# Patient Record
Sex: Male | Born: 1940
Health system: Southern US, Community
[De-identification: ages and names within clinical notes are randomized; demographics above are authoritative.]

## PROBLEM LIST (undated history)

## (undated) DIAGNOSIS — E291 Testicular hypofunction: Secondary | ICD-10-CM

## (undated) DIAGNOSIS — E785 Hyperlipidemia, unspecified: Secondary | ICD-10-CM

## (undated) DIAGNOSIS — Z923 Personal history of irradiation: Secondary | ICD-10-CM

## (undated) DIAGNOSIS — R413 Other amnesia: Secondary | ICD-10-CM

## (undated) DIAGNOSIS — Z87898 Personal history of other specified conditions: Secondary | ICD-10-CM

## (undated) DIAGNOSIS — K08109 Complete loss of teeth, unspecified cause, unspecified class: Secondary | ICD-10-CM

## (undated) DIAGNOSIS — M1990 Unspecified osteoarthritis, unspecified site: Secondary | ICD-10-CM

## (undated) DIAGNOSIS — Z789 Other specified health status: Secondary | ICD-10-CM

## (undated) DIAGNOSIS — R011 Cardiac murmur, unspecified: Secondary | ICD-10-CM

## (undated) DIAGNOSIS — F329 Major depressive disorder, single episode, unspecified: Secondary | ICD-10-CM

## (undated) DIAGNOSIS — Z8711 Personal history of peptic ulcer disease: Secondary | ICD-10-CM

## (undated) DIAGNOSIS — Z972 Presence of dental prosthetic device (complete) (partial): Secondary | ICD-10-CM

## (undated) DIAGNOSIS — H9190 Unspecified hearing loss, unspecified ear: Secondary | ICD-10-CM

## (undated) DIAGNOSIS — F32A Depression, unspecified: Secondary | ICD-10-CM

## (undated) DIAGNOSIS — I10 Essential (primary) hypertension: Secondary | ICD-10-CM

## (undated) DIAGNOSIS — Z973 Presence of spectacles and contact lenses: Secondary | ICD-10-CM

## (undated) DIAGNOSIS — K649 Unspecified hemorrhoids: Secondary | ICD-10-CM

## (undated) DIAGNOSIS — C61 Malignant neoplasm of prostate: Secondary | ICD-10-CM

## (undated) DIAGNOSIS — N400 Enlarged prostate without lower urinary tract symptoms: Secondary | ICD-10-CM

## (undated) DIAGNOSIS — R399 Unspecified symptoms and signs involving the genitourinary system: Secondary | ICD-10-CM

## (undated) DIAGNOSIS — N529 Male erectile dysfunction, unspecified: Secondary | ICD-10-CM

## (undated) HISTORY — PX: COLONOSCOPY: SHX174

## (undated) HISTORY — DX: Other amnesia: R41.3

## (undated) HISTORY — PX: MOUTH SURGERY: SHX715

## (undated) HISTORY — PX: MULTIPLE TOOTH EXTRACTIONS: SHX2053

---

## 1999-04-26 ENCOUNTER — Encounter: Payer: Self-pay | Admitting: *Deleted

## 1999-04-26 ENCOUNTER — Ambulatory Visit (HOSPITAL_COMMUNITY): Admission: RE | Admit: 1999-04-26 | Discharge: 1999-04-26 | Payer: Self-pay | Admitting: *Deleted

## 1999-04-29 ENCOUNTER — Ambulatory Visit (HOSPITAL_COMMUNITY): Admission: RE | Admit: 1999-04-29 | Discharge: 1999-04-29 | Payer: Self-pay | Admitting: *Deleted

## 1999-05-04 ENCOUNTER — Ambulatory Visit (HOSPITAL_COMMUNITY): Admission: RE | Admit: 1999-05-04 | Discharge: 1999-05-04 | Payer: Self-pay | Admitting: *Deleted

## 1999-05-05 ENCOUNTER — Ambulatory Visit (HOSPITAL_COMMUNITY): Admission: RE | Admit: 1999-05-05 | Discharge: 1999-05-05 | Payer: Self-pay | Admitting: *Deleted

## 1999-05-05 ENCOUNTER — Encounter: Payer: Self-pay | Admitting: *Deleted

## 1999-06-09 ENCOUNTER — Encounter: Payer: Self-pay | Admitting: General Surgery

## 1999-06-09 ENCOUNTER — Ambulatory Visit (HOSPITAL_COMMUNITY): Admission: RE | Admit: 1999-06-09 | Discharge: 1999-06-09 | Payer: Self-pay | Admitting: General Surgery

## 2002-05-24 ENCOUNTER — Encounter: Payer: Self-pay | Admitting: Emergency Medicine

## 2002-05-24 ENCOUNTER — Emergency Department (HOSPITAL_COMMUNITY): Admission: EM | Admit: 2002-05-24 | Discharge: 2002-05-24 | Payer: Self-pay | Admitting: Emergency Medicine

## 2002-08-29 ENCOUNTER — Emergency Department (HOSPITAL_COMMUNITY): Admission: EM | Admit: 2002-08-29 | Discharge: 2002-08-29 | Payer: Self-pay | Admitting: Emergency Medicine

## 2002-09-13 ENCOUNTER — Ambulatory Visit (HOSPITAL_COMMUNITY): Admission: RE | Admit: 2002-09-13 | Discharge: 2002-09-13 | Payer: Self-pay | Admitting: *Deleted

## 2003-01-24 ENCOUNTER — Emergency Department (HOSPITAL_COMMUNITY): Admission: EM | Admit: 2003-01-24 | Discharge: 2003-01-24 | Payer: Self-pay | Admitting: Emergency Medicine

## 2003-12-08 ENCOUNTER — Emergency Department (HOSPITAL_COMMUNITY): Admission: EM | Admit: 2003-12-08 | Discharge: 2003-12-08 | Payer: Self-pay | Admitting: Emergency Medicine

## 2005-08-13 ENCOUNTER — Emergency Department (HOSPITAL_COMMUNITY): Admission: EM | Admit: 2005-08-13 | Discharge: 2005-08-13 | Payer: Self-pay | Admitting: Family Medicine

## 2005-08-25 ENCOUNTER — Encounter: Admission: RE | Admit: 2005-08-25 | Discharge: 2005-08-25 | Payer: Self-pay | Admitting: Family Medicine

## 2005-09-23 ENCOUNTER — Encounter: Admission: RE | Admit: 2005-09-23 | Discharge: 2005-09-23 | Payer: Self-pay | Admitting: Family Medicine

## 2005-10-13 ENCOUNTER — Ambulatory Visit (HOSPITAL_COMMUNITY): Admission: RE | Admit: 2005-10-13 | Discharge: 2005-10-13 | Payer: Self-pay | Admitting: Orthopedic Surgery

## 2005-10-15 ENCOUNTER — Encounter: Admission: RE | Admit: 2005-10-15 | Discharge: 2005-10-15 | Payer: Self-pay | Admitting: Orthopedic Surgery

## 2005-10-31 ENCOUNTER — Encounter: Admission: RE | Admit: 2005-10-31 | Discharge: 2005-10-31 | Payer: Self-pay | Admitting: Orthopedic Surgery

## 2005-11-06 ENCOUNTER — Emergency Department (HOSPITAL_COMMUNITY): Admission: EM | Admit: 2005-11-06 | Discharge: 2005-11-06 | Payer: Self-pay

## 2005-11-22 ENCOUNTER — Encounter: Admission: RE | Admit: 2005-11-22 | Discharge: 2005-11-22 | Payer: Self-pay | Admitting: Orthopedic Surgery

## 2006-02-16 ENCOUNTER — Encounter: Admission: RE | Admit: 2006-02-16 | Discharge: 2006-02-16 | Payer: Self-pay | Admitting: Orthopedic Surgery

## 2006-06-02 ENCOUNTER — Encounter: Admission: RE | Admit: 2006-06-02 | Discharge: 2006-06-02 | Payer: Self-pay | Admitting: Orthopedic Surgery

## 2006-09-21 ENCOUNTER — Ambulatory Visit (HOSPITAL_BASED_OUTPATIENT_CLINIC_OR_DEPARTMENT_OTHER): Admission: RE | Admit: 2006-09-21 | Discharge: 2006-09-21 | Payer: Self-pay | Admitting: Urology

## 2006-09-21 HISTORY — PX: CIRCUMCISION: SUR203

## 2010-08-03 NOTE — Op Note (Signed)
NAME:  WILFRID, HYSER NO.:  [id number]   MEDICAL RECORD NO.:  [id number]          PATIENT TYPE:  AMB   LOCATION:  [facility]                         FACILITY:  [facility]   PHYSICIAN:  Sigmund I. Patsi Sears, M.D.DATE OF BIRTH:  23-Feb-1941   DATE OF PROCEDURE:  09/21/2006  DATE OF DISCHARGE:                               OPERATIVE REPORT   PREOPERATIVE DIAGNOSIS:  Chronic balanitis.   POSTOPERATIVE DIAGNOSIS:  Chronic balanitis.   OPERATION:  Circumcision.   SURGEON:  Sigmund I. Patsi Sears, M.D.   ANESTHESIA:  General LMA.   PREPARATION:  After appropriate preanesthesia, the patient was brought  to the operating room and placed on the operating room table in the  dorsal supine position where general LMA anesthesia was induced.  He was  then replaced in the dorsal lithotomy position where the pubis was  prepped with Betadine solution and draped in the usual fashion.   REVIEW OF HISTORY:  This 70 year old male has a history of chronic  balanitis, now cleared, in for elective circumcision.   PROCEDURE:  Using a marking pen, outlines of the pericoronal and  periglanular tissue was accomplished.  A circumferential 0.5 plain  Marcaine is injected at the base of the penis, and circumferentially  around the skin.  Incisions were made around the pericoronal and  periglanular surfaces, subcutaneous tissue was dissected, with minimal  bleeding noted.  Bleeding was electrocoagulated.   Four separate quadrants of 4-0 Monocryl suture were then placed and each  quadrant was closed with interrupted 4-0 Monocryl suture.  The patient  tolerated the procedure well.  He was given IV Toradol prior to  awakening.      Sigmund I. Patsi Sears, M.D.  Electronically Signed     SIT/MEDQ  D:  09/21/2006  T:  09/21/2006  Job:  [id number]

## 2010-08-06 NOTE — Op Note (Signed)
[facility]  Patient:    DEUNDRE, THONG                   MRN: [id number] Proc. Date: 04/29/99 Adm. Date:  [id number] Attending:  Sharyn Dross                           Operative Report  PREOPERATIVE DIAGNOSES: 1. History of gastroesophageal reflux disease. 2. Large hiatal hernia.  POSTOPERATIVE DIAGNOSIS:  Grossly normal examination; however, visualization was obscured due to the patients reaction.  PROCEDURE:  Esophagogastrostomy.  MEDICATIONS:  Demerol 50 mg IV, Versed 6 mg IV over a 10-minute period of time.  INSTRUMENT:  Olympus video panendoscopy.  ENDOSCOPIST:  Sharyn Dross., M.D.  INDICATIONS:  This pleasant 70 year old gentleman came into the office with increased shortness of breath and discomfort.  The patient has had a history of a large hiatal hernia with a previous barium, upper GI series, showing that there was a moderate amount of reflux up his nearly entire gastric area, into the esophageal region that is present.  Based on the patients symptoms, an x-ray was performed  for the shortness of breath, which basically showed that he either had a large hiatal hernia or an elevated diaphragm.  The patient was brought in for an endoscopic examination, based on these symptoms.  PHYSICAL EXAMINATION:  GENERAL:  He is a pleasant gentleman who appears to be in no acute distress at his time.  VITAL SIGNS:  Stable.  HEENT:  Anicteric.  NECK:  Supple.  LUNGS:  Clear.  HEART:  A regular rate and rhythm.  ABDOMEN:  Soft, with mild tenderness to palpation in the supraumbilical region. No rebound or referred tenderness appreciated.  RECTAL:  A digital rectal examination appeared to be unremarkable.  EXTREMITIES:  No cyanosis, clubbing, or edema.  PLAN: 1. We are going to proceed with the endoscopic examination. 2. Will schedule the patient for an esophageal manometry study, and a    gastric emptying study to  evaluate if this patient is an adequate candidate    for surgical repair of the large hiatal hernia that was appreciated.    Depending upon these results, will determine the course of therapy.  INFORMED CONSENT:  The patient was advised of the procedure, the indications and the risks involved.  The patient has agreed to have the procedure performed.  PREOPERATIVE PREPARATION:  The patient was brought into the endoscopy unit where an IV for IV-sedating medication was started.  A monitor was placed on the patient to monitor the patients vital signs and oxygen saturation.  Nasal oxygen at 2-3 L er minute was used, and after sedation was performed, the procedure was begun.  DESCRIPTION OF PROCEDURE:  The instrument was advanced, with the patient lying n the left lateral position via a direct technique without difficulty.  The oropharyngeal, epiglottis, vocal cords, and piriform sinuses appeared to be grossly within normal limits.  The esophagus appeared to show evidence of a short esophagus, but the measurement was not initially obtained at this time.  There appeared to be no gross abnormalities such as acute inflammation or ulceration hat was noted.  Upon advancing into the gastric region, the gastric area appeared to be grossly  within normal limits without evidence of acute inflammation or ulcerations that was noted; however, as the instrument was advanced further down into the region, there appeared to be poor visualization or no  visualization of the pylorus or the antral area that was noted.  The stomach appeared to be ______ in the region, as the instrument was following the folds down, but could not appreciate the antral region at this time.  The instrument was Jd and the distal portion advanced further for better visualization as fluid was retracted back.  Multiple photographs of the region were taken and the areas that appeared to show evidence where the antral  area  should be, did not show any visible opening at this time.  The instrument was subsequently continued to be advanced; however, the patient appeared to maybe aspirated some material into his tracheal region, and thus became very agitated and reactive.  Thus the instrument had to be removed on an emergent basis at this time.  The patient tolerated the procedure well after suction was obtained and the patient stabilized.  TREATMENT:  Conservative management.  Will proceed with the manometry studies as well as the gastric emptying study, but also will perform an upper GI series, to evaluate this region, as well as to evaluate how the stomach is emptying during  this process.  Depending upon the results, will determine the course of therapy. DD:  04/29/99 TD:  04/29/99 Job: [id number]

## 2011-01-04 LAB — I-STAT 8, (EC8 V) (CONVERTED LAB)
BUN: 16
Bicarbonate: 25.2 — ABNORMAL HIGH
Chloride: 109
Glucose, Bld: 119 — ABNORMAL HIGH
HCT: 51
Operator id: 134391
Potassium: 3.7
Sodium: 143
pCO2, Ven: 34.2 — ABNORMAL LOW
pH, Ven: 7.475 — ABNORMAL HIGH

## 2011-12-26 DIAGNOSIS — C61 Malignant neoplasm of prostate: Secondary | ICD-10-CM | POA: Insufficient documentation

## 2011-12-26 HISTORY — PX: PROSTATE BIOPSY: SHX241

## 2012-01-11 ENCOUNTER — Other Ambulatory Visit (HOSPITAL_COMMUNITY): Payer: Self-pay | Admitting: Urology

## 2012-01-11 DIAGNOSIS — C61 Malignant neoplasm of prostate: Secondary | ICD-10-CM

## 2012-01-13 ENCOUNTER — Encounter: Payer: Self-pay | Admitting: Radiation Oncology

## 2012-01-13 DIAGNOSIS — C50919 Malignant neoplasm of unspecified site of unspecified female breast: Secondary | ICD-10-CM | POA: Insufficient documentation

## 2012-01-16 ENCOUNTER — Encounter (HOSPITAL_COMMUNITY): Payer: Self-pay

## 2012-01-16 ENCOUNTER — Encounter (HOSPITAL_COMMUNITY): Payer: Medicare Other

## 2012-01-17 ENCOUNTER — Ambulatory Visit
Admission: RE | Admit: 2012-01-17 | Discharge: 2012-01-17 | Disposition: A | Discharge status: Home / Self Care | Payer: Medicare Other | Source: Ambulatory Visit | Attending: Radiation Oncology | Admitting: Radiation Oncology

## 2012-01-17 ENCOUNTER — Encounter: Payer: Self-pay | Admitting: Radiation Oncology

## 2012-01-17 VITALS — BP 151/77 | HR 96 | Temp 97.9°F | Resp 20 | Ht 68.0 in | Wt 190.3 lb

## 2012-01-17 DIAGNOSIS — C61 Malignant neoplasm of prostate: Secondary | ICD-10-CM | POA: Insufficient documentation

## 2012-01-17 DIAGNOSIS — K219 Gastro-esophageal reflux disease without esophagitis: Secondary | ICD-10-CM | POA: Insufficient documentation

## 2012-01-17 DIAGNOSIS — Z79899 Other long term (current) drug therapy: Secondary | ICD-10-CM | POA: Insufficient documentation

## 2012-01-17 DIAGNOSIS — E7800 Pure hypercholesterolemia, unspecified: Secondary | ICD-10-CM | POA: Insufficient documentation

## 2012-01-17 DIAGNOSIS — Z510 Encounter for antineoplastic radiation therapy: Secondary | ICD-10-CM | POA: Insufficient documentation

## 2012-01-17 DIAGNOSIS — Z853 Personal history of malignant neoplasm of breast: Secondary | ICD-10-CM | POA: Insufficient documentation

## 2012-01-17 DIAGNOSIS — Z87891 Personal history of nicotine dependence: Secondary | ICD-10-CM | POA: Insufficient documentation

## 2012-01-17 DIAGNOSIS — I10 Essential (primary) hypertension: Secondary | ICD-10-CM | POA: Insufficient documentation

## 2012-01-17 HISTORY — DX: Depression, unspecified: F32.A

## 2012-01-17 HISTORY — DX: Male erectile dysfunction, unspecified: N52.9

## 2012-01-17 HISTORY — DX: Testicular hypofunction: E29.1

## 2012-01-17 HISTORY — DX: Cardiac murmur, unspecified: R01.1

## 2012-01-17 HISTORY — DX: Unspecified osteoarthritis, unspecified site: M19.90

## 2012-01-17 HISTORY — DX: Malignant neoplasm of prostate: C61

## 2012-01-17 HISTORY — DX: Major depressive disorder, single episode, unspecified: F32.9

## 2012-01-17 HISTORY — DX: Benign prostatic hyperplasia without lower urinary tract symptoms: N40.0

## 2012-01-17 HISTORY — DX: Essential (primary) hypertension: I10

## 2012-01-17 NOTE — Progress Notes (Signed)
[facility] Radiation Oncology NEW PATIENT EVALUATION  Name: Ernest Turner MRN: [id number]  Date:   01/17/2012           DOB: May 06, 1940  Status: outpatient   CC: Dorrene German, MD  Kathi Ludwig, *    REFERRING PHYSICIAN: Kathi Ludwig, *   DIAGNOSIS: Stage TI C. intermediate to high-risk adenocarcinoma prostate   HISTORY OF PRESENT ILLNESS:  Ernest Turner is a 72 y.o. male who is seen today for the courtesy of Dr. Alexis Frock for discussion of possible radiation therapy in the management of his stage TI C. intermediate to high-risk adenocarcinoma prostate. He is long-standing history of elevated PSAs and high grade PIN. He a PSA of 10.5 back in June of 2008 showing benign disease. I stented he had high grade PIN back in 2005 and benign/inflammation 2006. Repeat biopsies with a PSA of 16.9 in September 2009 were also benign. However, there were foci that may have represented low grade PIN. His PSA in March of 2010 was 22.4 and a followup PSA in October 2010 was 21.7. I believe was placed on finasteride at about this time. His PSA in August of 2011 had decreased 11.7 (corrected value 23.4). His PSA in August of 2012 was 12.59 (corrected 25.2) rising to 15.84 by August of 2013 while still on finasteride. Therefore, he underwent repeat biopsies on 12/26/2011 revealing adenocarcinoma with a Gleason score 6 (3+3) involving 5% of one core from the left lateral base, Gleason 7 (3+4) involving less than 5% of one core from left lateral mid gland, 10% of one core from left lateral apex and 45% of one core from the left apex. His gland volume was approximately 28 cc. He does has significant obstructive urinary symptomatology with an I PSS score of 21. He does have hemorrhoidal discomfort. He has erectile dysfunction and has used a vacuum pump successfully. Accordingly the patient, he has not responded to Viagra like drugs.  PREVIOUS RADIATION THERAPY: No   PAST  MEDICAL HISTORY:  has a past medical history of Prostate cancer (12/26/11); BPH (benign prostatic hyperplasia); Hypogonadism male; Allergy; Arthritis; Depression; GERD (gastroesophageal reflux disease); Hypercholesterolemia; Hypertension; Heart murmur; Ulcer; ED (erectile dysfunction); and Breast cancer (11/14/11 bx).     PAST SURGICAL HISTORY:  Past Surgical History  Procedure Date  . Prostate biopsy 12/26/11    PSA=15.84,gleason=3+3=6,& 3+4=7,Volume=27.98cc,Adenocarcinoma  . Mouth surgery     hx     FAMILY HISTORY: family history includes Cancer in his daughter and sister. His father died following a stroke at 16. His mother died from congestive heart failure and complications of diabetes at age 39. No family history of prostate cancer.  SOCIAL HISTORY:  reports that he has quit smoking. His smoking use included Cigarettes. He has a 40 pack-year smoking history. He has never used smokeless tobacco. He reports that he does not drink alcohol or use illicit drugs. Married twice, 4 children. Retired Naval architect, currently works part-time as a Optician, dispensing.   ALLERGIES: Androgel and Viagra   MEDICATIONS:  Current Outpatient Prescriptions  Medication Sig Dispense Refill  . amLODipine (NORVASC) 10 MG tablet Take 10 mg by mouth daily.      . finasteride (PROSCAR) 5 MG tablet Take 5 mg by mouth daily.      Marland Kitchen oxybutynin (DITROPAN XL) 10 MG 24 hr tablet Take 10 mg by mouth daily.      . potassium chloride (K-DUR) 10 MEQ tablet Take 10 mEq by mouth daily.      Marland Kitchen  Tamsulosin HCl (FLOMAX) 0.4 MG CAPS Take 0.4 mg by mouth.      . terazosin (HYTRIN) 5 MG capsule       . Vitamin D, Ergocalciferol, (DRISDOL) 50000 UNITS CAPS Take 50,000 Units by mouth every 7 (seven) days.         REVIEW OF SYSTEMS:  Pertinent items are noted in HPI.    PHYSICAL EXAM:  height is 5\' 8"  (1.727 m) and weight is 190 lb 4.8 oz (86.32 kg). His oral temperature is 97.9 F (36.6 C). His blood pressure is 151/77 and his pulse  is 96. His respiration is 20.   Head and neck examination: Grossly unremarkable. Nodes: Without palpable cervical or supraclavicular lymphadenopathy. Chest: Lungs clear. Heart: Regular in rhythm. Back: Without spinal or CVA tenderness. Abdomen: Without masses organomegaly. Genitalia: Unremarkable to inspection. Rectal: He is a large external hemorrhoid. Gland size is normal and there is no induration or nodularity. Extremities: Without edema.   LABORATORY DATA:  Lab Results  Component Value Date   HGB 17.3* 09/21/2006   HCT 51.0 09/21/2006   Lab Results  Component Value Date   NA 143 09/21/2006   K 3.7 09/21/2006   CL 109 09/21/2006   No results found for this basename: ALT, AST, GGT, ALKPHOS, BILITOT   PSA from 01/10/2012  15.84 (corrected 30.7)   IMPRESSION: Stage TI C. intermediate to high-risk adenocarcinoma prostate. I do not believe that his PSA level correlates with his degree of prostate cancer considering his numerous biopsies which have shown either pain or benign disease up until this last biopsy earlier this month. He patient had low-grade prostatitis for a number of years. I slight the patient and his wife that his prognosis is related to his stage, PSA level, and Gleason score. Again I do not feel that his PSA level correlates with his relatively low volume of prostate cancer . His stage is favorable while his Gleason score of 7 is of intermediate favorability. His management options include robotic surgery versus close surveillance versus radiation therapy. Ration therapy options include 5 weeks of external beam followed by seed implantation or 8 weeks of external beam/IMRT. In view of his significant obstructive urinary symptoms I do not feel he is a candidate for seed implantation which would be a seed implant boost. After lengthy discussion he is most interested in external beam radiation therapy. We also talked about short-term androgen deprivation therapy considering his Gleason score  7, and also question about whether not his PSA level are presents and additional risk factor. He is in favor of short-term androgen deprivation therapy over a period of 6 months. We discussed the potential acute and late toxicities of radiation therapy and he wishes to proceed as outlined. I will, we asked Dr. Patsi Sears to start androgen deprivation therapy, and placed 3 gold seed markers for prostate localization/image guidance during his IMRT. I will see the patient back for a followup visit in 2 months; at which time will get him scheduled for simulation/treatment planning. I'll speak with Dr. Patsi Sears after he returns from the OR later today .   PLAN: As discussed above. Initiation of androgen deprivation therapy with Dr. Patsi Sears in followup visit with me in 2 months.   I spent 60 minutes minutes face to face with the patient and more than 50% of that time was spent in counseling and/or coordination of care.

## 2012-01-17 NOTE — Progress Notes (Signed)
Patient, states no dysuria, but has frequency,urgency voiding, and sometimes feels like he isn't emptying his bladder completely, has weak stream starting an stopping states patient,  No pain, l;oss of daughter this past June of breast cancer, was treated here  12:49 PM

## 2012-01-17 NOTE — Progress Notes (Signed)
12/26/11 Prostate  BX : Adenocarcinoma,gleason= 3+3=6,& 3+4=7, volume=27.98cc,PSA=15.84 11/10/11=15.84, 11/03/10=12.59 10/21/09=11.7(corrected 23.40 Finasteride) 10/210=21.70 05/2008=22.40  Patient alert,oriented x3    Allergies: Androgel gel,testim gel,Viagra tabsPlease see the Nurse Progress Note in the MD Initial Consult Encounter for this patient.

## 2012-01-24 ENCOUNTER — Telehealth: Payer: Self-pay | Admitting: *Deleted

## 2012-01-24 NOTE — Telephone Encounter (Signed)
Called patient to inform of gold seed placement for 03-07-12 arrival time - 1:30 pm at Dr. Hollie Beach Office and his visit with Dr. Dayton Scrape on 03-20-12, lvm for a return call.

## 2012-01-26 ENCOUNTER — Encounter (HOSPITAL_COMMUNITY)
Admission: RE | Admit: 2012-01-26 | Discharge: 2012-01-26 | Disposition: A | Discharge status: Home / Self Care | Payer: Medicare Other | Source: Ambulatory Visit | Attending: Urology | Admitting: Urology

## 2012-01-26 ENCOUNTER — Encounter (HOSPITAL_COMMUNITY): Payer: Self-pay

## 2012-01-26 ENCOUNTER — Ambulatory Visit (HOSPITAL_COMMUNITY)
Admission: RE | Admit: 2012-01-26 | Discharge: 2012-01-26 | Disposition: A | Discharge status: Home / Self Care | Payer: Medicare Other | Source: Ambulatory Visit | Attending: Urology | Admitting: Urology

## 2012-01-26 DIAGNOSIS — C61 Malignant neoplasm of prostate: Secondary | ICD-10-CM | POA: Insufficient documentation

## 2012-01-26 MED ORDER — TECHNETIUM TC 99M MEDRONATE IV KIT
23.9000 | PACK | Freq: Once | INTRAVENOUS | Status: AC | PRN
  Administered 2012-01-26: 23.9 via INTRAVENOUS

## 2012-03-16 ENCOUNTER — Encounter: Payer: Self-pay | Admitting: Radiation Oncology

## 2012-03-16 DIAGNOSIS — M1990 Unspecified osteoarthritis, unspecified site: Secondary | ICD-10-CM | POA: Insufficient documentation

## 2012-03-16 DIAGNOSIS — K219 Gastro-esophageal reflux disease without esophagitis: Secondary | ICD-10-CM | POA: Insufficient documentation

## 2012-03-16 DIAGNOSIS — F32A Depression, unspecified: Secondary | ICD-10-CM | POA: Insufficient documentation

## 2012-03-16 DIAGNOSIS — IMO0002 Reserved for concepts with insufficient information to code with codable children: Secondary | ICD-10-CM | POA: Insufficient documentation

## 2012-03-16 DIAGNOSIS — N529 Male erectile dysfunction, unspecified: Secondary | ICD-10-CM | POA: Insufficient documentation

## 2012-03-16 DIAGNOSIS — R011 Cardiac murmur, unspecified: Secondary | ICD-10-CM | POA: Insufficient documentation

## 2012-03-16 DIAGNOSIS — F329 Major depressive disorder, single episode, unspecified: Secondary | ICD-10-CM | POA: Insufficient documentation

## 2012-03-16 DIAGNOSIS — E7800 Pure hypercholesterolemia, unspecified: Secondary | ICD-10-CM | POA: Insufficient documentation

## 2012-03-16 DIAGNOSIS — E291 Testicular hypofunction: Secondary | ICD-10-CM | POA: Insufficient documentation

## 2012-03-16 DIAGNOSIS — T7840XA Allergy, unspecified, initial encounter: Secondary | ICD-10-CM | POA: Insufficient documentation

## 2012-03-16 DIAGNOSIS — N400 Enlarged prostate without lower urinary tract symptoms: Secondary | ICD-10-CM | POA: Insufficient documentation

## 2012-03-19 NOTE — Progress Notes (Signed)
Follow up New Consult Prostate Cancer Gold Seed placement (3) 03/07/12 by Dr. Jethro Bolus  Alert,oriented x3, no dysuria, patient states"I'm back to normal almost, steady stream", bowel movements daily with fiber No c/o pain,   No pacemaker

## 2012-03-20 ENCOUNTER — Encounter: Payer: Self-pay | Admitting: Radiation Oncology

## 2012-03-20 ENCOUNTER — Ambulatory Visit
Admission: RE | Admit: 2012-03-20 | Discharge: 2012-03-20 | Disposition: A | Discharge status: Home / Self Care | Payer: Medicare Other | Source: Ambulatory Visit | Attending: Radiation Oncology | Admitting: Radiation Oncology

## 2012-03-20 VITALS — BP 158/78 | HR 65 | Temp 97.4°F | Resp 20 | Ht 68.0 in | Wt 190.7 lb

## 2012-03-20 DIAGNOSIS — C61 Malignant neoplasm of prostate: Secondary | ICD-10-CM

## 2012-03-20 NOTE — Progress Notes (Signed)
CC: Dr. Alexis Frock   Followup note:  Ernest Turner returns today for review and scheduling of his radiation therapy in the management of his stage TI C. intermediate to high-risk adenocarcinoma prostate. He presented with elevated PSAs and was noted to have an uncorrected PSA of 15.84 in August of 2013 while on finasteride. Prostate biopsies on 12/26/2011 was diagnostic for Gleason 6 and Gleason 7 adenocarcinoma. He has significant obstructive symptomatology with an I PSS score of 21. Based on his urinary obstructive symptomatology, do not feel that he would be a candidate for seed implant boost after external beam radiation. Because of his intermediate to high-risk disease I also suggested a 6-8 month course of androgen deprivation therapy. He states that his urination/symptomatology is somewhat improved. He was started on androgen deprivation therapy on October 31, and Dr. Cassell Smiles was kind enough to place 3 gold seeds on December 18. He denies having any significant hot flashes. He continues to have some discomfort with hemorrhoids.  Physical examination: Alert and oriented 71 year old after American male appearing his stated age. Wt Readings from Last 3 Encounters:  03/20/12 190 lb 11.2 oz (86.501 kg)  01/17/12 190 lb 4.8 oz (86.32 kg)   Temp Readings from Last 3 Encounters:  03/20/12 97.4 F (36.3 C) Oral  01/17/12 97.9 F (36.6 C) Oral   BP Readings from Last 3 Encounters:  03/20/12 158/78  01/17/12 151/77   Pulse Readings from Last 3 Encounters:  03/20/12 65  01/17/12 96   Rectal examination: The prostate gland is normal size and is without focal induration or nodularity. Hemorrhoids are noted.  Impression: Stage TI C. intermediate to high-risk adenocarcinoma prostate. I feel he would be best suited for external beam/IMRT. We again discussed the potential acute and late toxicities of radiation therapy and he wishes to proceed as outlined. I'll have him return for  simulation/treatment planning the week of January 13, and begin his radiation therapy later in January. Consent is signed today.  Plan: As discussed above.  30 minutes was spent face-to-face with the patient, primarily counseling the patient.

## 2012-03-20 NOTE — Progress Notes (Signed)
Please see the Nurse Progress Note in the MD Initial Consult Encounter for this patient. 

## 2012-04-02 ENCOUNTER — Ambulatory Visit
Admission: RE | Admit: 2012-04-02 | Discharge: 2012-04-02 | Disposition: A | Discharge status: Home / Self Care | Payer: Medicare Other | Source: Ambulatory Visit | Attending: Radiation Oncology | Admitting: Radiation Oncology

## 2012-04-02 ENCOUNTER — Telehealth: Payer: Self-pay | Admitting: Radiation Oncology

## 2012-04-02 DIAGNOSIS — C61 Malignant neoplasm of prostate: Secondary | ICD-10-CM

## 2012-04-02 NOTE — Telephone Encounter (Signed)
Met w patient to discuss RO billing. Pt advised, his MCD is based on deductible status at this time and he may need some addl financial assistance going forwad, but will let me know.  Dx: Prostate   Attending Rad: Dr. Dayton Scrape  Rad Tx:  IMRT x 40

## 2012-04-02 NOTE — Progress Notes (Signed)
Simulation/treatment planning note: The patient was taken to the CT simulator for CT simulation. A VAC LOC immobilization device was constructed. A red rubber tube was placed the rectal vault. He was then catheterized and contrast instilled into the bladder/urethra. He was then scanned. I contoured the prostate, seminal vesicles, bladder, and rectum. Dosimetry will contour the remaining bowel and femoral heads. I'm prescribing 7800 cGy to his prostate PTV which represents the prostate plus 0.8 cm except for 0.5 cm along the rectum. I prescribing 5600 cGy in 40 sessions to his seminal vesicle PTV which are represents his seminal vesicles plus 0.5 cm. He'll undergo daily image guidance and be treated with a comfortably full bladder. He is now ready for IMRT simulation/treatment planning.

## 2012-04-04 ENCOUNTER — Emergency Department (HOSPITAL_COMMUNITY): Payer: Medicare Other

## 2012-04-04 ENCOUNTER — Encounter (HOSPITAL_COMMUNITY): Payer: Self-pay

## 2012-04-04 ENCOUNTER — Emergency Department (HOSPITAL_COMMUNITY)
Admission: EM | Admit: 2012-04-04 | Discharge: 2012-04-04 | Disposition: A | Discharge status: Home / Self Care | Payer: Medicare Other | Attending: Emergency Medicine | Admitting: Emergency Medicine

## 2012-04-04 DIAGNOSIS — Z8719 Personal history of other diseases of the digestive system: Secondary | ICD-10-CM | POA: Insufficient documentation

## 2012-04-04 DIAGNOSIS — R011 Cardiac murmur, unspecified: Secondary | ICD-10-CM | POA: Insufficient documentation

## 2012-04-04 DIAGNOSIS — R112 Nausea with vomiting, unspecified: Secondary | ICD-10-CM | POA: Diagnosis present

## 2012-04-04 DIAGNOSIS — K219 Gastro-esophageal reflux disease without esophagitis: Secondary | ICD-10-CM | POA: Insufficient documentation

## 2012-04-04 DIAGNOSIS — Z862 Personal history of diseases of the blood and blood-forming organs and certain disorders involving the immune mechanism: Secondary | ICD-10-CM | POA: Insufficient documentation

## 2012-04-04 DIAGNOSIS — Z8639 Personal history of other endocrine, nutritional and metabolic disease: Secondary | ICD-10-CM | POA: Insufficient documentation

## 2012-04-04 DIAGNOSIS — R0789 Other chest pain: Secondary | ICD-10-CM | POA: Insufficient documentation

## 2012-04-04 DIAGNOSIS — N400 Enlarged prostate without lower urinary tract symptoms: Secondary | ICD-10-CM | POA: Insufficient documentation

## 2012-04-04 DIAGNOSIS — Z8546 Personal history of malignant neoplasm of prostate: Secondary | ICD-10-CM | POA: Insufficient documentation

## 2012-04-04 DIAGNOSIS — F3289 Other specified depressive episodes: Secondary | ICD-10-CM | POA: Insufficient documentation

## 2012-04-04 DIAGNOSIS — R079 Chest pain, unspecified: Secondary | ICD-10-CM

## 2012-04-04 DIAGNOSIS — Z853 Personal history of malignant neoplasm of breast: Secondary | ICD-10-CM | POA: Insufficient documentation

## 2012-04-04 DIAGNOSIS — F329 Major depressive disorder, single episode, unspecified: Secondary | ICD-10-CM | POA: Insufficient documentation

## 2012-04-04 DIAGNOSIS — E7800 Pure hypercholesterolemia, unspecified: Secondary | ICD-10-CM | POA: Insufficient documentation

## 2012-04-04 DIAGNOSIS — M129 Arthropathy, unspecified: Secondary | ICD-10-CM | POA: Insufficient documentation

## 2012-04-04 DIAGNOSIS — Z79899 Other long term (current) drug therapy: Secondary | ICD-10-CM | POA: Insufficient documentation

## 2012-04-04 DIAGNOSIS — R0602 Shortness of breath: Secondary | ICD-10-CM | POA: Insufficient documentation

## 2012-04-04 LAB — POCT I-STAT TROPONIN I
Troponin i, poc: 0.01 ng/mL (ref 0.00–0.08)
Troponin i, poc: 0 ng/mL (ref 0.00–0.08)

## 2012-04-04 LAB — CBC
HCT: 38.2 % — ABNORMAL LOW (ref 39.0–52.0)
Hemoglobin: 13.2 g/dL (ref 13.0–17.0)
MCH: 30.8 pg (ref 26.0–34.0)
MCHC: 34.6 g/dL (ref 30.0–36.0)
MCV: 89.3 fL (ref 78.0–100.0)
RDW: 13.6 % (ref 11.5–15.5)

## 2012-04-04 LAB — COMPREHENSIVE METABOLIC PANEL
Albumin: 3.6 g/dL (ref 3.5–5.2)
Alkaline Phosphatase: 49 U/L (ref 39–117)
BUN: 12 mg/dL (ref 6–23)
Calcium: 9.7 mg/dL (ref 8.4–10.5)
Creatinine, Ser: 1.06 mg/dL (ref 0.50–1.35)
GFR calc Af Amer: 81 mL/min — ABNORMAL LOW (ref >90)
Glucose, Bld: 139 mg/dL — ABNORMAL HIGH (ref 70–99)
Potassium: 3.1 mEq/L — ABNORMAL LOW (ref 3.5–5.1)
Total Protein: 7 g/dL (ref 6.0–8.3)

## 2012-04-04 LAB — LIPASE, BLOOD: Lipase: 41 U/L (ref 11–59)

## 2012-04-04 LAB — OCCULT BLOOD, POC DEVICE: Fecal Occult Bld: NEGATIVE

## 2012-04-04 MED ORDER — NITROGLYCERIN IN D5W 200-5 MCG/ML-% IV SOLN: 10.0000 ug/min | INTRAVENOUS | Status: DC

## 2012-04-04 MED ORDER — NITROGLYCERIN 0.4 MG SL SUBL
0.4000 mg | SUBLINGUAL_TABLET | Freq: Once | SUBLINGUAL | Status: AC
  Administered 2012-04-04: 0.4 mg via SUBLINGUAL
  Filled 2012-04-04: qty 25

## 2012-04-04 MED ORDER — ONDANSETRON HCL 4 MG/2ML IJ SOLN
4.0000 mg | Freq: Once | INTRAMUSCULAR | Status: AC
  Administered 2012-04-04: 4 mg via INTRAVENOUS
  Filled 2012-04-04: qty 2

## 2012-04-04 MED ORDER — MORPHINE SULFATE 4 MG/ML IJ SOLN
4.0000 mg | Freq: Once | INTRAMUSCULAR | Status: AC
  Administered 2012-04-04: 4 mg via INTRAVENOUS
  Filled 2012-04-04: qty 1

## 2012-04-04 MED ORDER — ONDANSETRON HCL 4 MG/2ML IJ SOLN
INTRAMUSCULAR | Status: AC
  Administered 2012-04-04: 4 mg via INTRAVENOUS
  Filled 2012-04-04: qty 2

## 2012-04-04 MED ORDER — ASPIRIN 81 MG PO CHEW: 324.0000 mg | CHEWABLE_TABLET | Freq: Once | ORAL | Status: DC

## 2012-04-04 MED ORDER — ONDANSETRON HCL 4 MG PO TABS: 4.0000 mg | ORAL_TABLET | Freq: Three times a day (TID) | ORAL | Status: DC | PRN

## 2012-04-04 MED ORDER — POTASSIUM CHLORIDE 20 MEQ/15ML (10%) PO LIQD
40.0000 meq | Freq: Once | ORAL | Status: AC
  Administered 2012-04-04: 40 meq via ORAL
  Filled 2012-04-04: qty 30

## 2012-04-04 MED ORDER — ONDANSETRON HCL 4 MG/2ML IJ SOLN
4.0000 mg | Freq: Once | INTRAMUSCULAR | Status: AC
  Administered 2012-04-04: 4 mg via INTRAVENOUS

## 2012-04-04 MED ORDER — SODIUM CHLORIDE 0.9 % IV SOLN
INTRAVENOUS | Status: DC
  Administered 2012-04-04: 11:00:00 via INTRAVENOUS

## 2012-04-04 MED ORDER — HEPARIN SODIUM (PORCINE) 5000 UNIT/ML IJ SOLN: 60.0000 [IU]/kg | INTRAMUSCULAR | Status: DC

## 2012-04-04 NOTE — ED Notes (Signed)
Per [facility], pt started having left lower cp last night at 1800 after eating and painful respirations. Pain has been intermittent since last night always about a 3/10. Denies any radiation of the pain. States it feels better when he takes a deep breath in. Did report episodes of N/V last night and diaphoresis and some diaphoresis this morning. Pt was given NTG x 1, 324 mg ASA, and 4 mg Zofran by EMS. NTG dropped pressure from 136/80 to 104/68 so they gave NS 250 ml bolus

## 2012-04-04 NOTE — ED Provider Notes (Signed)
History     CSN: [id number]  Arrival date & time 04/04/12  7829   First MD Initiated Contact with Patient 04/04/12 [phone]      Chief Complaint  Patient presents with  . Shortness of Breath  . Chest Pain    (Consider location/radiation/quality/duration/timing/severity/associated sxs/prior treatment) HPI  Ernest Turner is a 72 y.o. male  past medical history significant for prostate cancer complaining of left-sided, nonradiating chest pain described as pressure and tightness. Pain is intermittent it is followed by nonbloody nonbilious vomiting and shortness of breath. Pain is rated at 5/10 at worst it is 2/10 right now. He has had 6 of these episodes starting last night at 9 PM. These episodes started at rest while he was driving in his job. Patient received aspirin and nitroglycerin prior to arrival. Patient states nitroglycerin aided his pain.  Pain has been non-exertional, non-pleuritic or positional. Pain is Denies , diaphoresis, cough, fever,back pain, syncope, prior episodes. Denies recent travel, leg swelling, hemoptysis.  Patient thinks that this may be secondary to food poisoning from a meat loaf it before this started.   RF:  hypertension, hyperlipidemia, former smoker with >40 pack years, ? Prior MI x20 years Cath:  never Last Stress test:  over 20 years ago  Cardiologost: Kilpatrick x20 years ago  PCP: Avbuere, Pt has not followed with him in >1year  Past Medical History  Diagnosis Date  . Prostate cancer 12/26/11    Adenocarcinoma,gleason=3+3=6.,& 3+4=7,PSA=15.84  . BPH (benign prostatic hyperplasia)   . Hypogonadism male   . Allergy   . Arthritis   . Depression   . GERD (gastroesophageal reflux disease)   . Hypercholesterolemia   . Hypertension   . Heart murmur   . Ulcer     hx peptic  . ED (erectile dysfunction)   . Breast cancer 11/14/11 bx    ??    Past Surgical History  Procedure Date  . Prostate biopsy 12/26/11    PSA=15.84,gleason=3+3=6,&  3+4=7,Volume=27.98cc,Adenocarcinoma  . Mouth surgery     hx    Family History  Problem Relation Age of Onset  . Cancer Sister     liver to brain mets died late 79's  . Cancer Daughter     breast cancer died age 8    History  Substance Use Topics  . Smoking status: Former Smoker -- 2.0 packs/day for 20 years    Types: Cigarettes  . Smokeless tobacco: Never Used  . Alcohol Use: No      Review of Systems  Constitutional: Negative for fever.  Respiratory: Positive for shortness of breath.   Cardiovascular: Positive for chest pain.  Gastrointestinal: Positive for nausea and vomiting. Negative for abdominal pain and diarrhea.  All other systems reviewed and are negative.    Allergies  Androgel and Viagra  Home Medications   Current Outpatient Rx  Name  Route  Sig  Dispense  Refill  . AMLODIPINE BESYLATE 10 MG PO TABS   Oral   Take 10 mg by mouth daily.         Marland Kitchen VITAMIN D PO   Oral   Take 1 tablet by mouth daily.         Marland Kitchen FINASTERIDE 5 MG PO TABS   Oral   Take 5 mg by mouth daily.         . OXYBUTYNIN CHLORIDE ER 10 MG PO TB24   Oral   Take 10 mg by mouth daily.         Marland Kitchen  POTASSIUM CHLORIDE ER 10 MEQ PO TBCR   Oral   Take 10 mEq by mouth daily.         Marland Kitchen TERAZOSIN HCL 5 MG PO CAPS               . ONDANSETRON HCL 4 MG PO TABS   Oral   Take 1 tablet (4 mg total) by mouth every 8 (eight) hours as needed for nausea.   10 tablet   0     BP 153/76  Pulse 91  Temp 98.5 F (36.9 C) (Oral)  Resp 24  Wt 186 lb (84.369 kg)  SpO2 99%  Physical Exam  Nursing note and vitals reviewed. Constitutional: He is oriented to person, place, and time. He appears well-developed and well-nourished. No distress.  HENT:  Head: Normocephalic.  Mouth/Throat: Oropharynx is clear and moist.  Eyes: Conjunctivae normal and EOM are normal. Pupils are equal, round, and reactive to light.  Neck: Normal range of motion.  Cardiovascular: Normal rate, regular  rhythm and intact distal pulses.   Pulmonary/Chest: Effort normal and breath sounds normal. No stridor. No respiratory distress. He has no wheezes. He has no rales. He exhibits no tenderness.  Abdominal: Soft. Bowel sounds are normal. He exhibits no distension and no mass. There is no tenderness. There is no rebound and no guarding.  Genitourinary:       Digital rectal exam chaperoned by technician. External hemorrhoids, good rectal tone, normal stool color, guaiac is negative  Musculoskeletal: Normal range of motion.  Neurological: He is alert and oriented to person, place, and time.  Psychiatric: He has a normal mood and affect.    ED Course  Procedures (including critical care time)  Labs Reviewed  CBC - Abnormal; Notable for the following:    HCT 38.2 (*)     All other components within normal limits  COMPREHENSIVE METABOLIC PANEL - Abnormal; Notable for the following:    Sodium 147 (*)     Potassium 3.1 (*)     Glucose, Bld 139 (*)     GFR calc non Af Amer 70 (*)     GFR calc Af Amer 81 (*)     All other components within normal limits  PRO B NATRIURETIC PEPTIDE - Abnormal; Notable for the following:    Pro B Natriuretic peptide (BNP) 126.5 (*)     All other components within normal limits  LIPASE, BLOOD  POCT I-STAT TROPONIN I  D-DIMER, QUANTITATIVE  POCT I-STAT TROPONIN I  OCCULT BLOOD, POC DEVICE   Dg Chest Port 1 View  04/04/2012  *RADIOLOGY REPORT*  Clinical Data: Short of breath, chest pain  PORTABLE CHEST - 1 VIEW  Comparison: The report from a remote chest x-ray dated 01/24/2003 is available for review, the images are currently unavailable.  Findings: Marked elevation of the left hemidiaphragm.  Per the prior report, this is a chronic finding.  There is associated volume loss in the left lung base.  Mild enlargement of the cardiopericardial silhouette.  There is left to right shift of the heart mediastinal structures.  The aerated upper left lung and right lung are  negative for edema, focal consolidation, pleural effusion or pneumothorax.  No acute osseous abnormality.  IMPRESSION:  1.  Chronic elevation of the left hemidiaphragm. 2.  Borderline cardiac enlargement   Original Report Authenticated By: Malachy Moan, M.D.     Date: 04/04/2012  Rate: 86  Rhythm: normal sinus rhythm  wandering atrial pacemaker  QRS Axis:  right  Intervals: normal  ST/T Wave abnormalities: nonspecific ST/T changes  Conduction Disutrbances:none and nonspecific intraventricular conduction delay  Narrative Interpretation:   Old EKG Reviewed: unchanged   1. Chest pain   2. Nausea & vomiting       MDM  Concern for cardiac origin of chest pain, shortness of breath nausea and vomiting. Patient's multiple risk factors including advanced age, tobacco abuse, hypertension hyperlipidemia, and possible prior MI. He has not had a cardiology exam in 20 years and has poor out outpatient followup.  EKG is nonischemic troponin is negative. Patient's BNP is very mildly elevated at 126. Chest x-ray shows borderline cardiomegaly.. Patient has a mild hypokalemia at 3.1 I will replete him orally. Guaiac is negative.   Patient's pain and nausea and vomiting are difficult to control. I will obtain cardiology consult.   Cardiology consult appreciated: They feel that this is not likely cardiac in origin. Have advised the patient to come in for observation based on his difficult to control pain emesis, tachycardia. Patient refused and has decided to sign out AMA. I have encouraged him and his wife to have a low threshold to return to the ED if there were any concerning symptoms.   New Prescriptions   ONDANSETRON (ZOFRAN) 4 MG TABLET    Take 1 tablet (4 mg total) by mouth every 8 (eight) hours as needed for nausea.          Wynetta Emery, PA-C 04/04/12 1649

## 2012-04-04 NOTE — ED Notes (Signed)
Nicole, PA back at the bedside.  

## 2012-04-04 NOTE — ED Notes (Signed)
Portable chest xray being completed.  

## 2012-04-04 NOTE — ED Notes (Signed)
Phlebotomy at the bedside  

## 2012-04-04 NOTE — ED Notes (Signed)
Jessica, PA with cardiology at the bedside.  

## 2012-04-04 NOTE — Consult Note (Signed)
Consult Note  Patient ID: Ernest Turner MRN: [id number], SOB: 11/11/1940 72 y.o. Date of Encounter: 04/04/2012, 2:12 PM  Primary Physician: Dorrene German, MD Primary Cardiologist: None  Chief Complaint: chest pain in the setting of frequent vomiting  HPI: 72 y.o. male w/ PMHx significant for HTN, HLD, remote tobacco abuse (quit >2yrs ago), GERD, and Prostate CA (scheduled to undergo IMRT treatment) who presented to [facility] on 04/04/2012 with complaints of chest pain in the setting of frequent vomiting.  No prior cardiac history. Was seen by a cardiologist (Dr. Amada Jupiter) about 36yrs ago and had what sounds like an exercise stress test that was normal. He has never had a heart attack or told he has coronary disease or heart failure. No family history of heart disease. Smoked for about 20 yrs and quit >85yrs ago. Had GERD many years ago, but has not taken PPI in a long time. Was recently diagnosed with prostate CA and is planned to start radiation treatment at the end of this month. He reports feeling well until yesterday evening, about 1.5 hrs after eating meatloaf, when he started vomiting. He notes the emesis was black in color, no bright red blood. After a couple times of vomiting he experienced chest pain and shortness of breath.  Also had epigastric/upper abdominal pain.  He was able to sleep last night, but vomited again this morning. He felt better after vomiting so went to work, but his co-workers thought he looked unwell so they called EMS. He had chest soreness today. He reports constipation over the last couple of days, but had a "black" BM this morning. No diarrhea or fever. He is normally able to climb ~17 stairs in his house and walk on his treadmill without chest pain. He has had decreased appetite and has lost ~25lbs over the last 6mos. No orthopnea, PND, edema, syncope, or palpitations.  In the ED, EKG revealed sinus arrhythmia with wandering atrial pacemaker, no  acute ST/T changes. CXR shows borderline cardiomegaly, no acute cardiopulmonary abnormalities. Labs are significant for normal DDimer, normal poc troponin x2, pBNP 126, glucose 129, K+ 3.1, unremarkable CBC. Received SL NTG, MSO4, and K+ supplementation. He has vomited a couple times in the ED. Currently without shortness of breath, abdominal or chest pain.    Past Medical History  Diagnosis Date  . Prostate cancer 12/26/11    Adenocarcinoma,gleason=3+3=6.,& 3+4=7,PSA=15.84  . BPH (benign prostatic hyperplasia)   . Hypogonadism male   . Allergy   . Arthritis   . Depression   . GERD (gastroesophageal reflux disease)   . Hypercholesterolemia   . Hypertension   . Heart murmur   . Ulcer     hx peptic  . ED (erectile dysfunction)   . Breast cancer 11/14/11 bx    ??     Surgical History:  Past Surgical History  Procedure Date  . Prostate biopsy 12/26/11    PSA=15.84,gleason=3+3=6,& 3+4=7,Volume=27.98cc,Adenocarcinoma  . Mouth surgery     hx     Home Meds: Medication Sig  amLODipine (NORVASC) 10 MG tablet Take 10 mg by mouth daily.  Cholecalciferol (VITAMIN D PO) Take 1 tablet by mouth daily.  finasteride (PROSCAR) 5 MG tablet Take 5 mg by mouth daily.  oxybutynin (DITROPAN XL) 10 MG 24 hr tablet Take 10 mg by mouth daily.  potassium chloride (K-DUR) 10 MEQ tablet Take 10 mEq by mouth daily.  terazosin (HYTRIN) 5 MG capsule     Allergies:  Allergies  Allergen Reactions  .  Androgel (Testosterone)     unknown  . Viagra (Sildenafil Citrate)     unknown    History   Social History  . Marital Status: Married    Spouse Name: N/A    Number of Children: 3  . Years of Education: N/A   Occupational History  . Retired    Social History Main Topics  . Smoking status: Former Smoker -- 2.0 packs/day for 20 years    Types: Cigarettes  . Smokeless tobacco: Never Used  . Alcohol Use: No  . Drug Use: No     Comment: quit smoking 20 years ago  . Sexually Active:    Other  Topics Concern  . Not on file   Social History Narrative  . No narrative on file     Family History  Problem Relation Age of Onset  . Cancer Sister     liver to brain mets died late 12's  . Cancer Daughter     breast cancer died age 57    Review of Systems: General: (+) ~25lb weight loss; negative for chills, fever, night sweats Cardiovascular: As per HPI Dermatological: negative for rash Respiratory: negative for cough or wheezing Urologic: negative for hematuria Abdominal: As per HPI Neurologic: negative for visual changes, syncope, or dizziness All other systems reviewed and are otherwise negative except as noted above.  Labs:    04/04/2012 10:19 04/04/2012 13:09  Troponin i, poc 0.01 0.00    04/04/2012 10:12  Pro B Natriuretic peptide (BNP) 126.5 (H)   Component Value Date   WBC 6.9 04/04/2012   HGB 13.2 04/04/2012   HCT 38.2* 04/04/2012   MCV 89.3 04/04/2012   PLT 157 04/04/2012    Lab 04/04/12 1012  NA 147*  K 3.1*  CL 106  CO2 29  BUN 12  CREATININE 1.06  CALCIUM 9.7  PROT 7.0  BILITOT 0.6  ALKPHOS 49  ALT 24  AST 30  GLUCOSE 139*   Component Value Date   DDIMER 0.48 04/04/2012    Radiology/Studies:   04/04/2012 -  PORTABLE CHEST - 1 VIEW  . Findings: Marked elevation of the left hemidiaphragm.  Per the prior report, this is a chronic finding.  There is associated volume loss in the left lung base.  Mild enlargement of the cardiopericardial silhouette.  There is left to right shift of the heart mediastinal structures.  The aerated upper left lung and right lung are negative for edema, focal consolidation, pleural effusion or pneumothorax.  No acute osseous abnormality.  IMPRESSION:  1.  Chronic elevation of the left hemidiaphragm. 2.  Borderline cardiac enlargement        EKG: 04/04/12 @ 1228 - Sinus arrhythmia with wandering atrial pacemaker, no acute ST/T changes  Physical Exam: Blood pressure 137/77, pulse 104, temperature 98 F (36.7 C),  temperature source Oral, resp. rate 20, weight 186 lb (84.369 kg), SpO2 99.00%. General: Well developed, elderly black male in no acute distress. Head: Normocephalic, atraumatic, sclera non-icteric, nares are without discharge Neck: Supple. Negative for carotid bruits and JVD. Lungs: Clear bilaterally to auscultation without wheezes, rales, or rhonchi. Breathing is unlabored. Heart: Regularly irregular with S1 S2. No murmurs, rubs, or gallops appreciated. Abdomen: Soft, non-tender, non-distended with normoactive bowel sounds. No rebound/guarding. No obvious abdominal masses. Msk:  Strength and tone appear normal for age. Extremities: No edema. No clubbing or cyanosis. Distal pedal pulses are intact and equal bilaterally. Neuro: Alert and oriented X 3. Moves all extremities spontaneously. Psych:  Responds  to questions appropriately with a normal affect.    ASSESSMENT AND PLAN:  72 y.o. male w/ PMHx significant for HTN, HLD, remote tobacco abuse (quit >59yrs ago), GERD, and Prostate CA (scheduled to undergo IMRT treatment) who presented to [facility] on 04/04/2012 with complaints of chest pain in the setting of frequent vomiting.   1. Nausea/Vomiting 2. Melena 3. Chest pain 4. Hypokalemia 5. Anorexia/Weight loss 6. Hyperlipidemia 7. Hypertension  Patient presents with nausea and vomiting since last night that started after eating meatloaf. He reports the emesis was black in color and that he had black stool this morning. He did not experience chest pain or shortness of breath until after frequent episodes of vomiting. He has no prior cardiac history and is able to exercise without anginal symptoms. EKG is nonischemic and troponin normal x2. DDimer normal. Do not suspect PE, ACS or cardiac etiology to his pain. No further cardiac work up indicated at this time. K+ supplemented. Would recommend stool guaiac and possible GI work up given anorexia, weight loss and black emesis/melena.    Signed, HOPE, JESSICA PA-C 04/04/2012, 2:12 PM  Patient seen and examined and history reviewed. Agree with above findings and plan. Pleasant 72 yo BM with history of HTN and hyperlipidemia is seen for evaluation of chest pain. NO prior cardiac history. Patient developed acute nausea and vomiting last night 1 hour after eating meatloaf. With active vomiting he had mid chest pain. Noted vomitus and bowels were "black". Vomiting persisted today X 6 and patient came to ED because he didn't feel well. Exam is unremarkable. No chest wall tenderness to palpation. Lungs are clear. No gallop, murmur, or rub. No epigastric pain now. Ecg is normal except for wandering atrial pacemaker. No acute ST-T changes. Cardiac enzymes are negative x 2. I think he is low risk from a cardiac standpoint. His chest pain was clearly related to active vomiting. Would recommend PPI. Symptoms now resolved. Would be OK to discharge from our view.   Theron Arista [facility] 04/04/2012 3:56 PM

## 2012-04-05 NOTE — ED Provider Notes (Signed)
Medical screening examination/treatment/procedure(s) were performed by non-physician practitioner and as supervising physician I was immediately available for consultation/collaboration.   Kailen Hinkle M Jenyfer Trawick, MD 04/05/12 0715 

## 2012-04-09 ENCOUNTER — Encounter: Payer: Self-pay | Admitting: Radiation Oncology

## 2012-04-09 NOTE — Progress Notes (Signed)
IMRT simulation/treatment planning note: Ernest Turner completed his IMRT simulation/treatment planning in the management of his carcinoma the prostate. IMRT was chosen to decrease the acute and late bladder and rectal toxicities compared to conventional or 3-D conformal radiation therapy. Dose volume histograms were obtained for the target structures including the prostate and seminal vesicles along with the avoidance structures including the bladder, rectum, and femoral heads. We met our departmental goals for our target and avoidance structures. He is treated with 2 volume modulated arcs with 6 MV photons to deliver 7800 cGy in 40 sessions to the prostate PTV and 5600 cGy in 40 sessions to his seminal vesicles. I requesting daily KV imaging, setting up to his 3 gold seeds in a weekly cone beam CT scan to confirm his alignment and also to assess his bladder filling.

## 2012-04-11 ENCOUNTER — Ambulatory Visit
Admission: RE | Admit: 2012-04-11 | Discharge: 2012-04-11 | Disposition: A | Discharge status: Home / Self Care | Payer: Medicare Other | Source: Ambulatory Visit | Attending: Radiation Oncology | Admitting: Radiation Oncology

## 2012-04-11 ENCOUNTER — Encounter: Payer: Self-pay | Admitting: *Deleted

## 2012-04-11 DIAGNOSIS — C61 Malignant neoplasm of prostate: Secondary | ICD-10-CM

## 2012-04-11 NOTE — Progress Notes (Signed)
Simulation verification note: The patient underwent simulation verification in the management of his carcinoma the prostate. He has an excellent set up to his gold seeds. He is being treated with 2 modulated arcs corresponding to one set of IMRT treatment devices [phone])

## 2012-04-12 ENCOUNTER — Ambulatory Visit
Admission: RE | Admit: 2012-04-12 | Discharge: 2012-04-12 | Disposition: A | Discharge status: Home / Self Care | Payer: Medicare Other | Source: Ambulatory Visit | Attending: Radiation Oncology | Admitting: Radiation Oncology

## 2012-04-13 ENCOUNTER — Ambulatory Visit
Admission: RE | Admit: 2012-04-13 | Discharge: 2012-04-13 | Disposition: A | Discharge status: Home / Self Care | Payer: Medicare Other | Source: Ambulatory Visit | Attending: Radiation Oncology | Admitting: Radiation Oncology

## 2012-04-16 ENCOUNTER — Ambulatory Visit
Admission: RE | Admit: 2012-04-16 | Discharge: 2012-04-16 | Disposition: A | Discharge status: Home / Self Care | Payer: Medicare Other | Source: Ambulatory Visit | Attending: Radiation Oncology | Admitting: Radiation Oncology

## 2012-04-16 ENCOUNTER — Encounter: Payer: Self-pay | Admitting: Radiation Oncology

## 2012-04-16 VITALS — BP 129/67 | HR 64 | Temp 99.2°F | Resp 20 | Wt 186.6 lb

## 2012-04-16 DIAGNOSIS — C61 Malignant neoplasm of prostate: Secondary | ICD-10-CM

## 2012-04-16 DIAGNOSIS — R109 Unspecified abdominal pain: Secondary | ICD-10-CM | POA: Insufficient documentation

## 2012-04-16 DIAGNOSIS — K5900 Constipation, unspecified: Secondary | ICD-10-CM | POA: Insufficient documentation

## 2012-04-16 DIAGNOSIS — R300 Dysuria: Secondary | ICD-10-CM | POA: Insufficient documentation

## 2012-04-16 DIAGNOSIS — R3915 Urgency of urination: Secondary | ICD-10-CM | POA: Insufficient documentation

## 2012-04-16 DIAGNOSIS — Z510 Encounter for antineoplastic radiation therapy: Secondary | ICD-10-CM | POA: Insufficient documentation

## 2012-04-16 NOTE — Progress Notes (Signed)
Pt denies pain, loss of appetite, fatigue. He states he ate something 2 wks ago that caused nausea, but none recently.

## 2012-04-16 NOTE — Progress Notes (Signed)
Weekly Management Note:  Site: Prostate Current Dose:  780  cGy Projected Dose: 7800  cGy  Narrative: The patient is seen today for routine under treatment assessment. CBCT/MVCT images/port films were reviewed. The chart was reviewed.   Bladder filling is satisfactory. No new GU or GI difficulties.  Physical Examination:  Filed Vitals:   04/16/12 1536  BP: 129/67  Pulse: 64  Temp: 99.2 F (37.3 C)  Resp: 20  .  Weight: 186 lb 9.6 oz (84.641 kg). No change.  Impression: Tolerating radiation therapy well.  Plan: Continue radiation therapy as planned.

## 2012-04-16 NOTE — Progress Notes (Signed)
Post sim ed completed w/pt. Gave pt "Radiation and You" booklet w/all pertinent pages marked and discussed, re: fatigue, diarrhea, skin care, urinary issues/care, nutrition, pain. All questions answered.

## 2012-04-17 ENCOUNTER — Ambulatory Visit
Admission: RE | Admit: 2012-04-17 | Discharge: 2012-04-17 | Disposition: A | Discharge status: Home / Self Care | Payer: Medicare Other | Source: Ambulatory Visit | Attending: Radiation Oncology | Admitting: Radiation Oncology

## 2012-04-18 ENCOUNTER — Ambulatory Visit
Admission: RE | Admit: 2012-04-18 | Discharge: 2012-04-18 | Disposition: A | Discharge status: Home / Self Care | Payer: Medicare Other | Source: Ambulatory Visit | Attending: Radiation Oncology | Admitting: Radiation Oncology

## 2012-04-19 ENCOUNTER — Ambulatory Visit
Admission: RE | Admit: 2012-04-19 | Discharge: 2012-04-19 | Disposition: A | Discharge status: Home / Self Care | Payer: Medicare Other | Source: Ambulatory Visit | Attending: Radiation Oncology | Admitting: Radiation Oncology

## 2012-04-20 ENCOUNTER — Ambulatory Visit
Admission: RE | Admit: 2012-04-20 | Discharge: 2012-04-20 | Disposition: A | Discharge status: Home / Self Care | Payer: Medicare Other | Source: Ambulatory Visit | Attending: Radiation Oncology | Admitting: Radiation Oncology

## 2012-04-23 ENCOUNTER — Ambulatory Visit
Admission: RE | Admit: 2012-04-23 | Discharge: 2012-04-23 | Disposition: A | Discharge status: Home / Self Care | Payer: Medicare Other | Source: Ambulatory Visit | Attending: Radiation Oncology | Admitting: Radiation Oncology

## 2012-04-23 DIAGNOSIS — C61 Malignant neoplasm of prostate: Secondary | ICD-10-CM

## 2012-04-23 NOTE — Progress Notes (Signed)
Weekly Management Note:  Site: Prostate Current Dose:  1755  cGy Projected Dose: 7800  cGy  Narrative: The patient is seen today for routine under treatment assessment. CBCT/MVCT images/port films were reviewed. The chart was reviewed.   He is urinating more frequently but he is drinking a fair amount of for her for bladder filling. Bladder filling is good today. He continues to have difficulty with hemorrhoids which has been a chronic problem. He uses sitz baths and Preparation H. I instructed him to start a stool softener such as MiraLax.  Physical Examination: There were no vitals filed for this visit..  Weight:  . No change.  Impression: Tolerating radiation therapy well.  Plan: Continue radiation therapy as planned.

## 2012-04-23 NOTE — Progress Notes (Signed)
Mr. Boyett has received 9 fractions to his pelvis.  He reports increased urination during the day  a nocturia x 1-2 on average.  He does report that he is drinking more fluids.  He also c/o nausea yesterday and this am, but none presently since he took an unknown OTC anti-emetic.    He c/o irritation of his hemorrhoids with some blood on tissue which is his norm.  He would like a recommendation of what to use on his hemorrhoids.

## 2012-04-24 ENCOUNTER — Ambulatory Visit
Admission: RE | Admit: 2012-04-24 | Discharge: 2012-04-24 | Disposition: A | Discharge status: Home / Self Care | Payer: Medicare Other | Source: Ambulatory Visit | Attending: Radiation Oncology | Admitting: Radiation Oncology

## 2012-04-24 ENCOUNTER — Encounter: Payer: Self-pay | Admitting: *Deleted

## 2012-04-24 ENCOUNTER — Encounter: Payer: Self-pay | Admitting: Radiation Oncology

## 2012-04-25 ENCOUNTER — Ambulatory Visit
Admission: RE | Admit: 2012-04-25 | Discharge: 2012-04-25 | Disposition: A | Discharge status: Home / Self Care | Payer: Medicare Other | Source: Ambulatory Visit | Attending: Radiation Oncology | Admitting: Radiation Oncology

## 2012-04-26 ENCOUNTER — Ambulatory Visit
Admission: RE | Admit: 2012-04-26 | Discharge: 2012-04-26 | Disposition: A | Discharge status: Home / Self Care | Payer: Medicare Other | Source: Ambulatory Visit | Attending: Radiation Oncology | Admitting: Radiation Oncology

## 2012-04-27 ENCOUNTER — Ambulatory Visit
Admission: RE | Admit: 2012-04-27 | Discharge: 2012-04-27 | Disposition: A | Discharge status: Home / Self Care | Payer: Medicare Other | Source: Ambulatory Visit | Attending: Radiation Oncology | Admitting: Radiation Oncology

## 2012-04-30 ENCOUNTER — Ambulatory Visit
Admission: RE | Admit: 2012-04-30 | Discharge: 2012-04-30 | Disposition: A | Discharge status: Home / Self Care | Payer: Medicare Other | Source: Ambulatory Visit | Attending: Radiation Oncology | Admitting: Radiation Oncology

## 2012-04-30 ENCOUNTER — Encounter: Payer: Self-pay | Admitting: Radiation Oncology

## 2012-04-30 VITALS — BP 166/7 | HR 89 | Temp 97.9°F | Resp 20 | Wt 185.1 lb

## 2012-04-30 DIAGNOSIS — C61 Malignant neoplasm of prostate: Secondary | ICD-10-CM

## 2012-04-30 NOTE — Progress Notes (Signed)
Weekly Management Note:  Site: Prostate Current Dose:  2730  cGy Projected Dose: 7800  cGy  Narrative: The patient is seen today for routine under treatment assessment. CBCT/MVCT images/port films were reviewed. The chart was reviewed.   Satisfactory bladder filling, but he has done better in the past. No new GU or GI difficulties. His bowels remain soft with a stool softener.  Physical Examination:  Filed Vitals:   04/30/12 1551  BP: 166/7  Pulse: 89  Temp: 97.9 F (36.6 C)  Resp: 20  .  Weight: 185 lb 1.6 oz (83.961 kg). No change .  Impression: Tolerating radiation therapy well.  Plan: Continue radiation therapy as planned.

## 2012-04-30 NOTE — Progress Notes (Signed)
Patient here put 14/40 rad txs prostate completed, alert,oriented x3, in great spirits, only has urgency in am to void, bm this am taking senna s daily prn, now  No pain,  3:56 PM

## 2012-05-01 ENCOUNTER — Ambulatory Visit
Admission: RE | Admit: 2012-05-01 | Discharge: 2012-05-01 | Disposition: A | Discharge status: Home / Self Care | Payer: Medicare Other | Source: Ambulatory Visit | Attending: Radiation Oncology | Admitting: Radiation Oncology

## 2012-05-02 ENCOUNTER — Ambulatory Visit: Payer: Medicare Other

## 2012-05-03 ENCOUNTER — Ambulatory Visit: Payer: Medicare Other

## 2012-05-04 ENCOUNTER — Ambulatory Visit: Payer: Medicare Other

## 2012-05-07 ENCOUNTER — Encounter: Payer: Self-pay | Admitting: Radiation Oncology

## 2012-05-07 ENCOUNTER — Ambulatory Visit
Admission: RE | Admit: 2012-05-07 | Discharge: 2012-05-07 | Disposition: A | Discharge status: Home / Self Care | Payer: Medicare Other | Source: Ambulatory Visit | Attending: Radiation Oncology | Admitting: Radiation Oncology

## 2012-05-07 VITALS — BP 146/77 | HR 77 | Temp 98.4°F | Resp 20 | Wt 186.2 lb

## 2012-05-07 DIAGNOSIS — C61 Malignant neoplasm of prostate: Secondary | ICD-10-CM

## 2012-05-07 NOTE — Addendum Note (Signed)
Encounter addended by: Maryln Gottron, MD on: 05/07/2012  4:19 PM<BR>     Documentation filed: Visit Diagnoses

## 2012-05-07 NOTE — Progress Notes (Signed)
Weekly Management Note:  Site: Prostate Current Dose: 3120   cGy Projected Dose: 7800  cGy  Narrative: The patient is seen today for routine under treatment assessment. CBCT/MVCT images/port films were reviewed. The chart was reviewed.   Satisfactory bladder filling today. No significant GU or GI difficulties.  Physical Examination:  Filed Vitals:   05/07/12 1544  BP: 146/77  Pulse: 77  Temp: 98.4 F (36.9 C)  Resp: 20  .  Weight: 186 lb 3.2 oz (84.46 kg). No change.  Impression: Tolerating radiation therapy well.  Plan: Continue radiation therapy as planned.

## 2012-05-07 NOTE — Progress Notes (Signed)
Pt denies pain, urinary or bowel issues, fatigue, loss of appetite. 

## 2012-05-08 ENCOUNTER — Ambulatory Visit
Admission: RE | Admit: 2012-05-08 | Discharge: 2012-05-08 | Disposition: A | Discharge status: Home / Self Care | Payer: Medicare Other | Source: Ambulatory Visit | Attending: Radiation Oncology | Admitting: Radiation Oncology

## 2012-05-09 ENCOUNTER — Ambulatory Visit
Admission: RE | Admit: 2012-05-09 | Discharge: 2012-05-09 | Disposition: A | Discharge status: Home / Self Care | Payer: Medicare Other | Source: Ambulatory Visit | Attending: Radiation Oncology | Admitting: Radiation Oncology

## 2012-05-10 ENCOUNTER — Ambulatory Visit
Admission: RE | Admit: 2012-05-10 | Discharge: 2012-05-10 | Disposition: A | Discharge status: Home / Self Care | Payer: Medicare Other | Source: Ambulatory Visit | Attending: Radiation Oncology | Admitting: Radiation Oncology

## 2012-05-11 ENCOUNTER — Ambulatory Visit
Admission: RE | Admit: 2012-05-11 | Discharge: 2012-05-11 | Disposition: A | Discharge status: Home / Self Care | Payer: Medicare Other | Source: Ambulatory Visit | Attending: Radiation Oncology | Admitting: Radiation Oncology

## 2012-05-14 ENCOUNTER — Encounter: Payer: Self-pay | Admitting: Radiation Oncology

## 2012-05-14 ENCOUNTER — Ambulatory Visit
Admission: RE | Admit: 2012-05-14 | Discharge: 2012-05-14 | Disposition: A | Discharge status: Home / Self Care | Payer: Medicare Other | Source: Ambulatory Visit | Attending: Radiation Oncology | Admitting: Radiation Oncology

## 2012-05-14 VITALS — BP 145/85 | HR 58 | Temp 97.3°F | Wt 184.2 lb

## 2012-05-14 DIAGNOSIS — C61 Malignant neoplasm of prostate: Secondary | ICD-10-CM

## 2012-05-14 NOTE — Progress Notes (Signed)
[name] here for routine weekly visit.  He denies pain, nocturia, incontinence or burning on urination.  He does report frequency of urination during the day as he has been drinking more fluids.  Also, he states that he has constipation that is making his hemmorhoids hurt.  Also, he is having nausea when he is getting up in the morning.

## 2012-05-14 NOTE — Progress Notes (Signed)
Weekly Management Note:  Site: Prostate Current Dose:  4095  cGy Projected Dose: 7800  cGy  Narrative: The patient is seen today for routine under treatment assessment. CBCT/MVCT images/port films were reviewed. The chart was reviewed.   Bladder filling satisfactory. No GU or GI difficulties. He takes Senokot S. to avoid constipation.  Physical Examination:  Filed Vitals:   05/14/12 1548  BP: 145/85  Pulse: 58  Temp: 97.3 F (36.3 C)  .  Weight: 184 lb 3.2 oz (83.553 kg). No change.  Impression: Tolerating radiation therapy well.  Plan: Continue radiation therapy as planned.

## 2012-05-15 ENCOUNTER — Ambulatory Visit
Admission: RE | Admit: 2012-05-15 | Discharge: 2012-05-15 | Disposition: A | Discharge status: Home / Self Care | Payer: Medicare Other | Source: Ambulatory Visit | Attending: Radiation Oncology | Admitting: Radiation Oncology

## 2012-05-16 ENCOUNTER — Ambulatory Visit
Admission: RE | Admit: 2012-05-16 | Discharge: 2012-05-16 | Disposition: A | Discharge status: Home / Self Care | Payer: Medicare Other | Source: Ambulatory Visit | Attending: Radiation Oncology | Admitting: Radiation Oncology

## 2012-05-17 ENCOUNTER — Ambulatory Visit
Admission: RE | Admit: 2012-05-17 | Discharge: 2012-05-17 | Disposition: A | Discharge status: Home / Self Care | Payer: Medicare Other | Source: Ambulatory Visit | Attending: Radiation Oncology | Admitting: Radiation Oncology

## 2012-05-18 ENCOUNTER — Ambulatory Visit
Admission: RE | Admit: 2012-05-18 | Discharge: 2012-05-18 | Disposition: A | Discharge status: Home / Self Care | Payer: Medicare Other | Source: Ambulatory Visit | Attending: Radiation Oncology | Admitting: Radiation Oncology

## 2012-05-21 ENCOUNTER — Ambulatory Visit
Admission: RE | Admit: 2012-05-21 | Discharge: 2012-05-21 | Disposition: A | Discharge status: Home / Self Care | Payer: Medicare Other | Source: Ambulatory Visit | Attending: Radiation Oncology | Admitting: Radiation Oncology

## 2012-05-21 VITALS — BP 151/73 | HR 73 | Temp 97.6°F | Wt 184.8 lb

## 2012-05-21 DIAGNOSIS — C61 Malignant neoplasm of prostate: Secondary | ICD-10-CM

## 2012-05-21 NOTE — Progress Notes (Signed)
Ernest Turner is here for his weekly under treatment visit. He denies pain at this time.  He does have urinary frequency in the mornings starting at 5:00 am where he has to go 4-5 times.  He denies incontinence and diarrhea.  He states his skin is intact in the treatment area.

## 2012-05-21 NOTE — Progress Notes (Signed)
Weekly Management Note:  Site: Prostate Current Dose:  5070  cGy Projected Dose: 7800  cGy  Narrative: The patient is seen today for routine under treatment assessment. CBCT/MVCT images/port films were reviewed. The chart was reviewed.   Bladder filling is suboptimal today. Cone beam CT was reviewed with the patient. He tells that his bladder did not feel full today. He still having some difficulty with constipation and took this morning. He has not been taking a daily stool softener and he was told to take 1 daily.  Physical Examination:  Filed Vitals:   05/21/12 1352  BP: 151/73  Pulse: 73  Temp: 97.6 F (36.4 C)  .  Weight: 184 lb 12.8 oz (83.825 kg). No change.  Impression: Tolerating radiation therapy well.  Plan: Continue radiation therapy as planned.

## 2012-05-22 ENCOUNTER — Ambulatory Visit
Admission: RE | Admit: 2012-05-22 | Discharge: 2012-05-22 | Disposition: A | Discharge status: Home / Self Care | Payer: Medicare Other | Source: Ambulatory Visit | Attending: Radiation Oncology | Admitting: Radiation Oncology

## 2012-05-23 ENCOUNTER — Ambulatory Visit
Admission: RE | Admit: 2012-05-23 | Discharge: 2012-05-23 | Disposition: A | Discharge status: Home / Self Care | Payer: Medicare Other | Source: Ambulatory Visit | Attending: Radiation Oncology | Admitting: Radiation Oncology

## 2012-05-24 ENCOUNTER — Ambulatory Visit
Admission: RE | Admit: 2012-05-24 | Discharge: 2012-05-24 | Disposition: A | Discharge status: Home / Self Care | Payer: Medicare Other | Source: Ambulatory Visit | Attending: Radiation Oncology | Admitting: Radiation Oncology

## 2012-05-25 ENCOUNTER — Ambulatory Visit: Payer: Medicare Other

## 2012-05-28 ENCOUNTER — Ambulatory Visit
Admission: RE | Admit: 2012-05-28 | Discharge: 2012-05-28 | Disposition: A | Discharge status: Home / Self Care | Payer: Medicare Other | Source: Ambulatory Visit | Attending: Radiation Oncology | Admitting: Radiation Oncology

## 2012-05-28 ENCOUNTER — Encounter: Payer: Self-pay | Admitting: Radiation Oncology

## 2012-05-28 VITALS — BP 166/88 | HR 86 | Temp 97.5°F | Wt 185.7 lb

## 2012-05-28 DIAGNOSIS — C61 Malignant neoplasm of prostate: Secondary | ICD-10-CM

## 2012-05-28 NOTE — Progress Notes (Signed)
Weekly Management Note:  Site: Prostate\ Current Dose:  5850  cGy Projected Dose: 7800  cGy  Narrative: The patient is seen today for routine under treatment assessment. CBCT/MVCT images/port films were reviewed. The chart was reviewed.   Bladder filling is satisfactory. No new GU or GI difficulties.  Physical Examination:  Filed Vitals:   05/28/12 1534  BP: 166/88  Pulse: 86  Temp: 97.5 F (36.4 C)  .  Weight: 185 lb 11.2 oz (84.233 kg). No change.  Impression: Tolerating radiation therapy well.  Plan: Continue radiation therapy as planned.

## 2012-05-28 NOTE — Progress Notes (Signed)
Ernest Turner is here for his weekly ut visit.  He has had 30/40 fractions to his prostate.  He denies pain, urinary hesitancy or weak urinary stream.  He does have some urgency during the day.  He usually has to urinate at 5:00 am everyday.  He also denies hematuria and fatigue.

## 2012-05-29 ENCOUNTER — Ambulatory Visit
Admission: RE | Admit: 2012-05-29 | Discharge: 2012-05-29 | Disposition: A | Discharge status: Home / Self Care | Payer: Medicare Other | Source: Ambulatory Visit | Attending: Radiation Oncology | Admitting: Radiation Oncology

## 2012-05-30 ENCOUNTER — Ambulatory Visit
Admission: RE | Admit: 2012-05-30 | Discharge: 2012-05-30 | Disposition: A | Discharge status: Home / Self Care | Payer: Medicare Other | Source: Ambulatory Visit | Attending: Radiation Oncology | Admitting: Radiation Oncology

## 2012-05-31 ENCOUNTER — Ambulatory Visit
Admission: RE | Admit: 2012-05-31 | Discharge: 2012-05-31 | Disposition: A | Discharge status: Home / Self Care | Payer: Medicare Other | Source: Ambulatory Visit | Attending: Radiation Oncology | Admitting: Radiation Oncology

## 2012-06-01 ENCOUNTER — Ambulatory Visit
Admission: RE | Admit: 2012-06-01 | Discharge: 2012-06-01 | Disposition: A | Discharge status: Home / Self Care | Payer: Medicare Other | Source: Ambulatory Visit | Attending: Radiation Oncology | Admitting: Radiation Oncology

## 2012-06-04 ENCOUNTER — Ambulatory Visit
Admission: RE | Admit: 2012-06-04 | Discharge: 2012-06-04 | Disposition: A | Discharge status: Home / Self Care | Payer: Medicare Other | Source: Ambulatory Visit | Attending: Radiation Oncology | Admitting: Radiation Oncology

## 2012-06-04 ENCOUNTER — Ambulatory Visit: Admission: RE | Admit: 2012-06-04 | Payer: Medicare Other | Source: Ambulatory Visit | Admitting: Radiation Oncology

## 2012-06-04 VITALS — BP 146/77 | HR 72 | Temp 97.4°F | Wt 184.3 lb

## 2012-06-04 DIAGNOSIS — C61 Malignant neoplasm of prostate: Secondary | ICD-10-CM

## 2012-06-04 NOTE — Progress Notes (Signed)
Ernest Turner is here for his weekly ut visit.  He has had 35/40 fractions to his prostate.  He denies pain, fatigue, hesitancy and diarrhea.  He does have urinary frequency starting at 5 am until 10 am every day.  He denies nocturia.

## 2012-06-04 NOTE — Progress Notes (Signed)
   Weekly Management Note:  outpatient Current Dose:  68.25 Gy  Projected Dose: 78 Gy   Narrative:  The patient presents for routine under treatment assessment.  CBCT/MVCT images/Port film x-rays were reviewed.  The chart was checked. He is doing well.  Only new symptom is a little burning at end of urination  Physical Findings:  weight is 184 lb 4.8 oz (83.598 kg). His temperature is 97.4 F (36.3 C). His blood pressure is 146/77 and his pulse is 72.  NAD, well appearing  Impression:  The patient is tolerating radiotherapy.  Plan:  Continue radiotherapy as planned.  ________________________________   Lonie Peak, M.D.

## 2012-06-05 ENCOUNTER — Ambulatory Visit: Payer: Medicare Other

## 2012-06-05 ENCOUNTER — Ambulatory Visit
Admission: RE | Admit: 2012-06-05 | Discharge: 2012-06-05 | Disposition: A | Discharge status: Home / Self Care | Payer: Medicare Other | Source: Ambulatory Visit | Attending: Radiation Oncology | Admitting: Radiation Oncology

## 2012-06-06 ENCOUNTER — Ambulatory Visit: Payer: Medicare Other

## 2012-06-06 ENCOUNTER — Ambulatory Visit
Admission: RE | Admit: 2012-06-06 | Discharge: 2012-06-06 | Disposition: A | Discharge status: Home / Self Care | Payer: Medicare Other | Source: Ambulatory Visit | Attending: Radiation Oncology | Admitting: Radiation Oncology

## 2012-06-07 ENCOUNTER — Ambulatory Visit
Admission: RE | Admit: 2012-06-07 | Discharge: 2012-06-07 | Disposition: A | Discharge status: Home / Self Care | Payer: Medicare Other | Source: Ambulatory Visit | Attending: Radiation Oncology | Admitting: Radiation Oncology

## 2012-06-08 ENCOUNTER — Ambulatory Visit: Payer: Medicare Other

## 2012-06-08 ENCOUNTER — Ambulatory Visit
Admission: RE | Admit: 2012-06-08 | Discharge: 2012-06-08 | Disposition: A | Discharge status: Home / Self Care | Payer: Medicare Other | Source: Ambulatory Visit | Attending: Radiation Oncology | Admitting: Radiation Oncology

## 2012-06-11 ENCOUNTER — Ambulatory Visit
Admission: RE | Admit: 2012-06-11 | Discharge: 2012-06-11 | Disposition: A | Discharge status: Home / Self Care | Payer: Medicare Other | Source: Ambulatory Visit | Attending: Radiation Oncology | Admitting: Radiation Oncology

## 2012-06-11 VITALS — BP 137/71 | HR 66 | Temp 98.5°F | Wt 184.2 lb

## 2012-06-11 DIAGNOSIS — C61 Malignant neoplasm of prostate: Secondary | ICD-10-CM

## 2012-06-11 NOTE — Progress Notes (Addendum)
[name] here for weekly ut and final treatment visit.  He has had 40/40 fractions to his prostate.  He denies fatigue.  He does have pain in his lower abdomen that he rates at a 5 on a 0-10 scale when he has to urinate.  He also states that he has burning when he urinates.  He denies urgency and frequency of urination.  He also denies nocturia.  He is having constipation.

## 2012-06-11 NOTE — Progress Notes (Signed)
Weekly Management Note:  Site: Prostate Current Dose:  7800  cGy Projected Dose: 7800  cGy  Narrative: The patient is seen today for routine under treatment assessment. CBCT/MVCT images/port films were reviewed. The chart was reviewed.   Bladder filling is satisfactory. No new GU or GI difficulties. He does have suprapubic discomfort prior to urination. He also has mild dysuria along with chronic constipation.  Physical Examination:  Filed Vitals:   06/11/12 1547  BP: 137/71  Pulse: 66  Temp: 98.5 F (36.9 C)  .  Weight: 184 lb 3.2 oz (83.553 kg). No change.  Impression: Radiation therapy completed. Treatment well tolerated.  Plan: Followup visit with me in one month.

## 2012-06-12 ENCOUNTER — Encounter: Payer: Self-pay | Admitting: Radiation Oncology

## 2012-06-12 NOTE — Progress Notes (Signed)
[facility] Radiation Oncology End of Treatment Note  Name:Ernest Turner  Date: 06/12/2012 AVW:[id number] DOB:[birth year]   Status:outpatient    CC: Dorrene German, MD  , Dr. Oda Kilts  REFERRING PHYSICIAN:    Dr. Oda Kilts   DIAGNOSIS:  Stage TI C. intermediate to high-risk adenocarcinoma prostate  INDICATION FOR TREATMENT: Curative   TREATMENT DATES: 04/11/2012 through 06/11/2012                          SITE/DOSE:  Prostate 7800 cGy 40 sessions, seminal vesicles 5600 cGy 40 sessions                        BEAMS/ENERGY:   6 MV photons dual ARC IMRT    with daily image guided            NARRATIVE:  [name] tolerated treatment well with no significant GU or GI toxicity by completion of therapy.                         PLAN: Routine followup in one month. Patient instructed to call if questions or worsening complaints in interim.

## 2012-07-05 ENCOUNTER — Encounter: Payer: Self-pay | Admitting: Oncology

## 2012-07-11 ENCOUNTER — Ambulatory Visit: Payer: Medicare Other | Admitting: Radiation Oncology

## 2012-07-17 ENCOUNTER — Telehealth: Payer: Self-pay | Admitting: Oncology

## 2012-07-17 ENCOUNTER — Encounter: Payer: Self-pay | Admitting: Radiation Oncology

## 2012-07-17 ENCOUNTER — Ambulatory Visit
Admission: RE | Admit: 2012-07-17 | Discharge: 2012-07-17 | Disposition: A | Discharge status: Home / Self Care | Payer: Medicare Other | Source: Ambulatory Visit | Attending: Radiation Oncology | Admitting: Radiation Oncology

## 2012-07-17 VITALS — BP 146/78 | HR 59 | Temp 97.5°F | Resp 20 | Wt 174.4 lb

## 2012-07-17 DIAGNOSIS — C61 Malignant neoplasm of prostate: Secondary | ICD-10-CM

## 2012-07-17 NOTE — Progress Notes (Signed)
Patient here follow up rad tx prostate 04/11/12-06/11/12,alert,oriented x3,steady gait, still has dysuria in mornings, regular bowel movements, appetite good,  No other c/o, no hematuria 5:22 PM

## 2012-07-17 NOTE — Telephone Encounter (Signed)
Called Ernest Turner to see if he was coming for his follow up appointment at 4:40 today.  Asked [name] to call us when he got the message.

## 2012-07-17 NOTE — Progress Notes (Signed)
CC: Dr. Alexis Frock  Mr. Urieta returns today approximately 1 month following completion of external beam/IMRT in the management of his stage TI C. intermediate to high-risk adenocarcinoma prostate. He does have slight dysuria with his first morning urination. This may be secondary to a more concentrated urine. He is otherwise doing well from a GU and GI standpoint. Our plan is to have him on 6 months of androgen deprivation therapy, he only recalls having 1 shot 2 to 3 months prior to initiation of his radiation therapy. I'm not sure if he had a six-month injection or a 3-4 month injection. He does not yet have a followup appointment with Dr. Patsi Sears. He is on Hytrin and Ditropan.  Physical examination: Alert and oriented. Filed Vitals:   07/17/12 1719  BP: 146/78  Pulse: 59  Temp: 97.5 F (36.4 C)  Resp: 20   Rectal examination not performed today.  Impression: Satisfactory progress. I told Mr. Nordling to contact Dr. Imelda Pillow office to see if he needs 1 more androgen deprivation injection to make a total of 6 months. This may not be necessary at this point in time. The patient will make sure that he sees Dr. Patsi Sears for a followup visit in approximately 2 months at which time he can have a PSA determination. I've not scheduled the patient for a formal followup visit and I ask that Dr. Patsi Sears keep me posted on his progress.

## 2012-08-03 ENCOUNTER — Encounter: Payer: Self-pay | Admitting: Radiation Oncology

## 2012-08-03 NOTE — Progress Notes (Signed)
Patient stopped by today asking for assistance in paying his bill.  Gave him a MCD and EPP to complete and bring back.

## 2013-04-10 ENCOUNTER — Encounter: Payer: Self-pay | Admitting: Gastroenterology

## 2013-05-29 ENCOUNTER — Encounter: Payer: Medicare Other | Admitting: Gastroenterology

## 2013-12-23 ENCOUNTER — Other Ambulatory Visit (INDEPENDENT_AMBULATORY_CARE_PROVIDER_SITE_OTHER): Payer: Self-pay | Admitting: Surgery

## 2014-01-20 ENCOUNTER — Encounter (HOSPITAL_BASED_OUTPATIENT_CLINIC_OR_DEPARTMENT_OTHER): Payer: Self-pay | Admitting: *Deleted

## 2014-01-20 NOTE — Progress Notes (Signed)
Daughter called-pts wife passes away last yr-he lives alone but forgetful-she will bring him for ekg-bmet-and care for him post op

## 2014-01-21 ENCOUNTER — Other Ambulatory Visit: Payer: Self-pay

## 2014-01-21 ENCOUNTER — Encounter (HOSPITAL_BASED_OUTPATIENT_CLINIC_OR_DEPARTMENT_OTHER)
Admission: RE | Admit: 2014-01-21 | Discharge: 2014-01-21 | Disposition: A | Discharge status: Home / Self Care | Payer: Medicare Other | Source: Ambulatory Visit | Attending: Surgery | Admitting: Surgery

## 2014-01-21 ENCOUNTER — Telehealth (INDEPENDENT_AMBULATORY_CARE_PROVIDER_SITE_OTHER): Payer: Self-pay

## 2014-01-21 ENCOUNTER — Encounter (HOSPITAL_BASED_OUTPATIENT_CLINIC_OR_DEPARTMENT_OTHER): Payer: Self-pay | Admitting: *Deleted

## 2014-01-21 DIAGNOSIS — Z01810 Encounter for preprocedural cardiovascular examination: Secondary | ICD-10-CM | POA: Insufficient documentation

## 2014-01-21 NOTE — Telephone Encounter (Signed)
Cindy at [facility] called to state that Pt has been off Norvasc and other medications about 3 weeks.  Dr. Al Corpus wants the case postponed until pt can get back on medications. Message was also given to [facility], surgery scheduler.

## 2014-01-21 NOTE — Progress Notes (Signed)
Pt admits to being out of most of his meds for over 3 weeks.  He has not taken any hypertensive meds for same reason. Spoke with Dr Al Corpus and he spoke with pt.  Case postponed till pt is medical cleared and back on meds.  Mr Goettl understands it is for his safety and daughter informed of same.  Pt has appointment on 23 of Nov, but daughter will attempt to move up appointment now that she is aware he has been off med for over 3 weeks.   I called Dr Evlyn Courier office, spoke with nurse and she is attempting to notify him so case can be cancelled.  Will call and confirm she has spoken to him and case cancelled

## 2014-01-22 ENCOUNTER — Ambulatory Visit (HOSPITAL_BASED_OUTPATIENT_CLINIC_OR_DEPARTMENT_OTHER): Admission: RE | Admit: 2014-01-22 | Payer: Medicare Other | Source: Ambulatory Visit | Admitting: Surgery

## 2014-01-22 HISTORY — DX: Complete loss of teeth, unspecified cause, unspecified class: K08.109

## 2014-01-22 HISTORY — DX: Complete loss of teeth, unspecified cause, unspecified class: Z97.2

## 2014-01-22 HISTORY — DX: Presence of spectacles and contact lenses: Z97.3

## 2014-01-22 HISTORY — DX: Unspecified hearing loss, unspecified ear: H91.90

## 2014-01-22 SURGERY — REPAIR, HERNIA, INGUINAL, ADULT
Anesthesia: General | Laterality: Left

## 2014-03-25 DIAGNOSIS — C61 Malignant neoplasm of prostate: Secondary | ICD-10-CM | POA: Diagnosis not present

## 2014-03-25 DIAGNOSIS — E291 Testicular hypofunction: Secondary | ICD-10-CM | POA: Diagnosis not present

## 2014-03-25 DIAGNOSIS — R339 Retention of urine, unspecified: Secondary | ICD-10-CM | POA: Diagnosis not present

## 2014-04-04 DIAGNOSIS — E784 Other hyperlipidemia: Secondary | ICD-10-CM | POA: Diagnosis not present

## 2014-04-04 DIAGNOSIS — I10 Essential (primary) hypertension: Secondary | ICD-10-CM | POA: Diagnosis not present

## 2014-04-04 DIAGNOSIS — N400 Enlarged prostate without lower urinary tract symptoms: Secondary | ICD-10-CM | POA: Diagnosis not present

## 2014-04-04 DIAGNOSIS — R7301 Impaired fasting glucose: Secondary | ICD-10-CM | POA: Diagnosis not present

## 2014-04-24 ENCOUNTER — Encounter (HOSPITAL_COMMUNITY): Payer: Self-pay | Admitting: Pharmacy Technician

## 2014-04-24 NOTE — Pre-Procedure Instructions (Signed)
Ernest Turner  04/24/2014   Your procedure is scheduled on:  Tuesday, February 9th  Report to [facility] Admitting at 530 AM.  Call this number if you have problems the morning of surgery: [phone]   Remember:   Do not eat food or drink liquids after midnight.   Take these medicines the morning of surgery with A SIP OF WATER: norvasc, proscar, ditropan   Do not wear jewelry  Do not wear lotions, powders, or perfume, deodorant.  Do not shave 48 hours prior to surgery. Men may shave face and neck.  Do not bring valuables to the hospital.  [facility] is not responsible for any belongings or valuables.               Contacts, dentures or bridgework may not be worn into surgery.  Leave suitcase in the car. After surgery it may be brought to your room.  For patients admitted to the hospital, discharge time is determined by your treatment team.               Patients discharged the day of surgery will not be allowed to drive home.  Please read over the following fact sheets that you were given: Pain Booklet, Coughing and Deep Breathing and Surgical Site Infection Prevention  [facility] - Preparing for Surgery  Before surgery, you can play an important role.  Because skin is not sterile, your skin needs to be as free of germs as possible.  You can reduce the number of germs on you skin by washing with CHG (chlorahexidine gluconate) soap before surgery.  CHG is an antiseptic cleaner which kills germs and bonds with the skin to continue killing germs even after washing.  Please DO NOT use if you have an allergy to CHG or antibacterial soaps.  If your skin becomes reddened/irritated stop using the CHG and inform your nurse when you arrive at Short Stay.  Do not shave (including legs and underarms) for at least 48 hours prior to the first CHG shower.  You may shave your face.  Please follow these instructions carefully:   1.  Shower with CHG Soap the night before surgery  and the morning of Surgery.  2.  If you choose to wash your hair, wash your hair first as usual with your normal shampoo.  3.  After you shampoo, rinse your hair and body thoroughly to remove the shampoo.  4.  Use CHG as you would any other liquid soap.  You can apply CHG directly to the skin and wash gently with scrungie or a clean washcloth.  5.  Apply the CHG Soap to your body ONLY FROM THE NECK DOWN.  Do not use on open wounds or open sores.  Avoid contact with your eyes, ears, mouth and genitals (private parts).  Wash genitals (private parts) with your normal soap.  6.  Wash thoroughly, paying special attention to the area where your surgery will be performed.  7.  Thoroughly rinse your body with warm water from the neck down.  8.  DO NOT shower/wash with your normal soap after using and rinsing off the CHG Soap.  9.  Pat yourself dry with a clean towel.            10.  Wear clean pajamas.            11.  Place clean sheets on your bed the night of your first shower and do not sleep with pets.  Day of Surgery  Do not apply any lotions/deoderants the morning of surgery.  Please wear clean clothes to the hospital/surgery center.

## 2014-04-25 ENCOUNTER — Encounter (HOSPITAL_COMMUNITY)
Admission: RE | Admit: 2014-04-25 | Discharge: 2014-04-25 | Disposition: A | Discharge status: Home / Self Care | Payer: 59 | Source: Ambulatory Visit | Attending: Surgery | Admitting: Surgery

## 2014-04-25 ENCOUNTER — Other Ambulatory Visit (INDEPENDENT_AMBULATORY_CARE_PROVIDER_SITE_OTHER): Payer: Self-pay | Admitting: Surgery

## 2014-04-25 ENCOUNTER — Encounter (HOSPITAL_COMMUNITY): Payer: Self-pay

## 2014-04-25 ENCOUNTER — Other Ambulatory Visit (HOSPITAL_COMMUNITY): Payer: Self-pay | Admitting: *Deleted

## 2014-04-25 DIAGNOSIS — K4090 Unilateral inguinal hernia, without obstruction or gangrene, not specified as recurrent: Secondary | ICD-10-CM | POA: Diagnosis not present

## 2014-04-25 DIAGNOSIS — Z01812 Encounter for preprocedural laboratory examination: Secondary | ICD-10-CM | POA: Insufficient documentation

## 2014-04-25 HISTORY — DX: Unspecified hemorrhoids: K64.9

## 2014-04-25 LAB — BASIC METABOLIC PANEL
ANION GAP: 4 — AB (ref 5–15)
BUN: 10 mg/dL (ref 6–23)
CALCIUM: 9.6 mg/dL (ref 8.4–10.5)
CHLORIDE: 105 mmol/L (ref 96–112)
CO2: 30 mmol/L (ref 19–32)
CREATININE: 1.08 mg/dL (ref 0.50–1.35)
GFR calc Af Amer: 78 mL/min — ABNORMAL LOW (ref >90)
GFR calc non Af Amer: 67 mL/min — ABNORMAL LOW (ref >90)
Glucose, Bld: 105 mg/dL — ABNORMAL HIGH (ref 70–99)
Potassium: 3.4 mmol/L — ABNORMAL LOW (ref 3.5–5.1)
SODIUM: 139 mmol/L (ref 135–145)

## 2014-04-25 LAB — CBC
HCT: 41.9 % (ref 39.0–52.0)
Hemoglobin: 14.4 g/dL (ref 13.0–17.0)
MCH: 31 pg (ref 26.0–34.0)
MCHC: 34.4 g/dL (ref 30.0–36.0)
MCV: 90.1 fL (ref 78.0–100.0)
Platelets: 175 10*3/uL (ref 150–400)
RBC: 4.65 MIL/uL (ref 4.22–5.81)
RDW: 14 % (ref 11.5–15.5)
WBC: 5.8 10*3/uL (ref 4.0–10.5)

## 2014-04-25 NOTE — Progress Notes (Signed)
No pre-op orders in EPIC. Called Dr. Trevor Mace office and spoke with Sunday Spillers. She will give message to Dr. Ninfa Linden.

## 2014-04-25 NOTE — Progress Notes (Signed)
Pt reported that he has not had his BP medicine for several weeks. States he got a call from [facility] yesterday stating that he had Rx's to pick up but when he got there they didn't have any for him. I called [facility] and spoke with pharmacist and he states the call the pt got was an automated call telling him it was time for a refill. The pharmacist checked pt's Amlodipine Rx and it is time for a refill, when he checked about the Terazosin, he said that Rx had not been ordered since ?May, 2015. Pt can't remember taking a pill at bedtime which is how we was instructed to take Terazosin. The Potassium Rx had run out in 2014, pt thought he had been taking it. The pharmacist is going to fill the Rx's that are needing refilled and pt can pick them up today. I called Dr. Santiago Bur office to get a list of what meds that [name] has him on. They will fax the list to me.  Pt's daughter is here with him, she was not in the office during his PAT appt. I talked with her about the medicine issue and she feels that he has been taking his BP medicine (or should have been) because he had a 3 month supply that was filled in mid November. She did state that she has noticed some memory issues with her father and has discussed this with [name]. I told her that the medications would be ready to be picked up today at [facility]. I did tell her that his BP was good today. I made sure she was aware of the pre-op instructions and encouraged her to read over the paper work.  Pt denies any cardiac history, denies chest pain or sob.

## 2014-04-25 NOTE — Pre-Procedure Instructions (Signed)
Ernest Turner  04/25/2014   Your procedure is scheduled on:  Tuesday, April 29, 2014 at 7:30 AM.   Report to [facility] Entrance "A" Admitting Office at 5:30 AM.   Call this number if you have problems the morning of surgery: [phone]   Remember:   Do not eat food or drink liquids after midnight Monday, 04/28/14.   Take these medicines the morning of surgery with A SIP OF WATER: Amlodipine (Norvasc), Finasteride (Proscar)   Do not wear jewelry.  Do not wear lotions, powders, or cologne. You may wear deodorant.  Men may shave face and neck.  Do not bring valuables to the hospital.  [facility] is not responsible                  for any belongings or valuables.               Contacts, dentures or bridgework may not be worn into surgery.  Leave suitcase in the car. After surgery it may be brought to your room.  For patients admitted to the hospital, discharge time is determined by your                treatment team.               Patients discharged the day of surgery will not be allowed to drive home.    Special Instructions: [facility] - Preparing for Surgery  Before surgery, you can play an important role.  Because skin is not sterile, your skin needs to be as free of germs as possible.  You can reduce the number of germs on you skin by washing with CHG (chlorahexidine gluconate) soap before surgery.  CHG is an antiseptic cleaner which kills germs and bonds with the skin to continue killing germs even after washing.  Please DO NOT use if you have an allergy to CHG or antibacterial soaps.  If your skin becomes reddened/irritated stop using the CHG and inform your nurse when you arrive at Short Stay.  Do not shave (including legs and underarms) for at least 48 hours prior to the first CHG shower.  You may shave your face.  Please follow these instructions carefully:   1.  Shower with CHG Soap the night before surgery and the                                morning  of Surgery.  2.  If you choose to wash your hair, wash your hair first as usual with your       normal shampoo.  3.  After you shampoo, rinse your hair and body thoroughly to remove the                      Shampoo.  4.  Use CHG as you would any other liquid soap.  You can apply chg directly       to the skin and wash gently with scrungie or a clean washcloth.  5.  Apply the CHG Soap to your body ONLY FROM THE NECK DOWN.        Do not use on open wounds or open sores.  Avoid contact with your eyes, ears, mouth and genitals (private parts).  Wash genitals (private parts) with your normal soap.  6.  Wash thoroughly, paying special attention to the area where your surgery  will be performed.  7.  Thoroughly rinse your body with warm water from the neck down.  8.  DO NOT shower/wash with your normal soap after using and rinsing off       the CHG Soap.  9.  Pat yourself dry with a clean towel.            10.  Wear clean pajamas.            11.  Place clean sheets on your bed the night of your first shower and do not        sleep with pets.  Day of Surgery  Do not apply any lotions the morning of surgery.  Please wear clean clothes to the hospital.    Please read over the following fact sheets that you were given: Pain Booklet, Coughing and Deep Breathing and Surgical Site Infection Prevention

## 2014-04-28 MED ORDER — CEFAZOLIN SODIUM-DEXTROSE 2-3 GM-% IV SOLR
2.0000 g | INTRAVENOUS | Status: AC
  Administered 2014-04-29: 2 g via INTRAVENOUS
  Filled 2014-04-28: qty 50

## 2014-04-28 NOTE — Progress Notes (Signed)
Pt notified of time change.To arrive at 0700.Verbalized understanding

## 2014-04-28 NOTE — H&P (Signed)
[facility]  Location: [facility] Surgery Patient #: [id number] DOB: [birth year] Widowed / Language: Cleophus Molt / Race: Black or African American Male  History of Present Illness Patient words: bleeding hems.  The patient is a 74 year old male who presents with an inguinal hernia. this is a very pleasant gentleman referred by [name] for evaluation of a left inguinal hernia and hemorrhoids. The inguinal hernia was recently found on physical examination. the hernia is asymptomatic. he has not noticed the bulge and has no abdominal pain. he has had intermittent hemorrhoidal bleeding and perianal discomfort most of his adult life. He has had no real medications to treat his hemorrhoids. He has no issues moving his bowels. He is otherwise without complaints.  Additional reasons for visit:  Hemorrhoids   Other Problems  Arthritis Depression High blood pressure Prostate Cancer  Past Surgical History  Colon Polyp Removal - Colonoscopy  Social History Caffeine use Coffee. Tobacco use Former smoker.  Family History  Arthritis Father, Mother. Diabetes Mellitus Mother. Hypertension Father.  Review of Systems ( General Not Present- Appetite Loss, Chills, Fatigue, Fever, Night Sweats, Weight Gain and Weight Loss. Skin Not Present- Change in Wart/Mole, Dryness, Hives, Jaundice, New Lesions, Non-Healing Wounds, Rash and Ulcer. HEENT Present- Ringing in the Ears. Not Present- Earache, Hearing Loss, Hoarseness, Nose Bleed, Oral Ulcers, Seasonal Allergies, Sinus Pain, Sore Throat, Visual Disturbances, Wears glasses/contact lenses and Yellow Eyes. Respiratory Not Present- Bloody sputum, Chronic Cough, Difficulty Breathing, Snoring and Wheezing. Breast Not Present- Breast Mass, Breast Pain, Nipple Discharge and Skin Changes. Cardiovascular Not Present- Chest Pain, Difficulty Breathing Lying Down, Leg Cramps, Palpitations, Rapid Heart Rate, Shortness of Breath and Swelling  of Extremities. Gastrointestinal Present- Hemorrhoids. Not Present- Abdominal Pain, Bloating, Bloody Stool, Change in Bowel Habits, Chronic diarrhea, Constipation, Difficulty Swallowing, Excessive gas, Gets full quickly at meals, Indigestion, Nausea, Rectal Pain and Vomiting. Male Genitourinary Present- Change in Urinary Stream, Impotence and Urgency. Not Present- Blood in Urine, Frequency, Nocturia, Painful Urination and Urine Leakage. Musculoskeletal Not Present- Back Pain, Joint Pain, Joint Stiffness, Muscle Pain, Muscle Weakness and Swelling of Extremities. Neurological Not Present- Decreased Memory, Fainting, Headaches, Numbness, Seizures, Tingling, Tremor, Trouble walking and Weakness. Psychiatric Not Present- Anxiety, Bipolar, Change in Sleep Pattern, Depression, Fearful and Frequent crying. Endocrine Not Present- Cold Intolerance, Excessive Hunger, Hair Changes, Heat Intolerance, Hot flashes and New Diabetes. Hematology Not Present- Easy Bruising, Excessive bleeding, Gland problems, HIV and Persistent Infections.   Vitals ( 12/23/2013 1:57 PM Weight: 200.38 lb Height: 68in Body Surface Area: 2.09 m Body Mass Index: 30.47 kg/m Temp.: 98.65F(Oral)  Pulse: 78 (Regular)  Resp.: 16 (Unlabored)  BP: 134/82 (Sitting, Left Arm, Standard)    Physical Exam ( General Note: well-develsped, well-nourished gentleman in no acute distress   Integumentary Note: no rashes or erythema   Head and Neck Note: external ears and nose normal, hearing is normal, oropharynx is clear neck supple, trachea midline   Eye Note: pupils reactive bilaterally, anicteric   Chest and Lung Exam Note: clear to auscultation bilaterally with normal respiratory effort   Cardiovascular Note: regulaemaate and rhythm with no murmurs or peripheral edema   Abdomen Note: soft, nontender, nondistended There is an easily reducible left inguinal hernia there is no evidence of right  inguinal hernia or umbilical hernia   Rectal Note: there is an enlarged, edematous external hemorrhoid   Neurologic Note: awake, alert, oriented   Neuropsychiatric Note: judgment and affect are normal     Assessment & Plan  LEFT INGUINAL HERNIA (K40.90)  Impression: I discussed the diagnosis of hernia with him in detail. I discussed conservative management versus surgery. I discussed the surgery in detail. I discussed the risks which includes but is not limited to bleeding, infection, recurrence, use of mesh, chronic pain, cardiopulmonary issues, et Ronney Asters. I also discussed postoperative recovery. He wishes to proceed with hernia repair and fix his hemorrhoids and a later date

## 2014-04-29 ENCOUNTER — Ambulatory Visit (HOSPITAL_COMMUNITY)
Admission: RE | Admit: 2014-04-29 | Discharge: 2014-04-29 | Disposition: A | Discharge status: Home / Self Care | Payer: Medicare Other | Source: Ambulatory Visit | Attending: Surgery | Admitting: Surgery

## 2014-04-29 ENCOUNTER — Encounter (HOSPITAL_COMMUNITY): Payer: Self-pay | Admitting: Certified Registered Nurse Anesthetist

## 2014-04-29 ENCOUNTER — Ambulatory Visit (HOSPITAL_COMMUNITY): Payer: Medicare Other | Admitting: Certified Registered Nurse Anesthetist

## 2014-04-29 ENCOUNTER — Encounter (HOSPITAL_COMMUNITY)
Admission: RE | Disposition: A | Discharge status: Home / Self Care | Payer: Self-pay | Source: Ambulatory Visit | Attending: Surgery

## 2014-04-29 DIAGNOSIS — K649 Unspecified hemorrhoids: Secondary | ICD-10-CM | POA: Diagnosis not present

## 2014-04-29 DIAGNOSIS — M1990 Unspecified osteoarthritis, unspecified site: Secondary | ICD-10-CM | POA: Insufficient documentation

## 2014-04-29 DIAGNOSIS — K4090 Unilateral inguinal hernia, without obstruction or gangrene, not specified as recurrent: Secondary | ICD-10-CM | POA: Insufficient documentation

## 2014-04-29 DIAGNOSIS — Z79899 Other long term (current) drug therapy: Secondary | ICD-10-CM | POA: Diagnosis not present

## 2014-04-29 DIAGNOSIS — F329 Major depressive disorder, single episode, unspecified: Secondary | ICD-10-CM | POA: Diagnosis not present

## 2014-04-29 DIAGNOSIS — K219 Gastro-esophageal reflux disease without esophagitis: Secondary | ICD-10-CM | POA: Diagnosis not present

## 2014-04-29 DIAGNOSIS — Z87891 Personal history of nicotine dependence: Secondary | ICD-10-CM | POA: Insufficient documentation

## 2014-04-29 DIAGNOSIS — I10 Essential (primary) hypertension: Secondary | ICD-10-CM | POA: Diagnosis not present

## 2014-04-29 DIAGNOSIS — Z8546 Personal history of malignant neoplasm of prostate: Secondary | ICD-10-CM | POA: Diagnosis not present

## 2014-04-29 HISTORY — PX: INGUINAL HERNIA REPAIR: SHX194

## 2014-04-29 HISTORY — PX: INSERTION OF MESH: SHX5868

## 2014-04-29 SURGERY — REPAIR, HERNIA, INGUINAL, ADULT
Anesthesia: General | Site: Groin | Laterality: Left

## 2014-04-29 MED ORDER — LIDOCAINE HCL (CARDIAC) 20 MG/ML IV SOLN
INTRAVENOUS | Status: DC | PRN
  Administered 2014-04-29: 100 mg via INTRAVENOUS

## 2014-04-29 MED ORDER — 0.9 % SODIUM CHLORIDE (POUR BTL) OPTIME
TOPICAL | Status: DC | PRN
  Administered 2014-04-29: 1000 mL

## 2014-04-29 MED ORDER — EPHEDRINE SULFATE 50 MG/ML IJ SOLN
INTRAMUSCULAR | Status: DC | PRN
  Administered 2014-04-29 (×2): 10 mg via INTRAVENOUS

## 2014-04-29 MED ORDER — HYDROCODONE-ACETAMINOPHEN 5-325 MG PO TABS: 1.0000 | ORAL_TABLET | ORAL | Status: DC | PRN

## 2014-04-29 MED ORDER — FENTANYL CITRATE 0.05 MG/ML IJ SOLN
25.0000 ug | INTRAMUSCULAR | Status: DC | PRN
  Administered 2014-04-29 (×4): 25 ug via INTRAVENOUS

## 2014-04-29 MED ORDER — ONDANSETRON HCL 4 MG/2ML IJ SOLN
4.0000 mg | Freq: Once | INTRAMUSCULAR | Status: AC
  Administered 2014-04-29: 4 mg via INTRAVENOUS

## 2014-04-29 MED ORDER — SUCCINYLCHOLINE CHLORIDE 20 MG/ML IJ SOLN
INTRAMUSCULAR | Status: DC | PRN
  Administered 2014-04-29: 160 mg via INTRAVENOUS

## 2014-04-29 MED ORDER — MIDAZOLAM HCL 2 MG/2ML IJ SOLN
INTRAMUSCULAR | Status: AC
  Filled 2014-04-29: qty 2

## 2014-04-29 MED ORDER — FENTANYL CITRATE 0.05 MG/ML IJ SOLN
INTRAMUSCULAR | Status: DC | PRN
  Administered 2014-04-29: 25 ug via INTRAVENOUS
  Administered 2014-04-29: 150 ug via INTRAVENOUS

## 2014-04-29 MED ORDER — KETOROLAC TROMETHAMINE 15 MG/ML IJ SOLN: 15.0000 mg | Freq: Once | INTRAMUSCULAR | Status: DC

## 2014-04-29 MED ORDER — SODIUM CHLORIDE 0.9 % IV SOLN: 250.0000 mL | INTRAVENOUS | Status: DC | PRN

## 2014-04-29 MED ORDER — PROPOFOL 10 MG/ML IV BOLUS
INTRAVENOUS | Status: DC | PRN
  Administered 2014-04-29: 150 mg via INTRAVENOUS

## 2014-04-29 MED ORDER — OXYCODONE HCL 5 MG PO TABS
5.0000 mg | ORAL_TABLET | ORAL | Status: DC | PRN
  Administered 2014-04-29: 10 mg via ORAL

## 2014-04-29 MED ORDER — OXYCODONE HCL 5 MG PO TABS
ORAL_TABLET | ORAL | Status: AC
  Filled 2014-04-29: qty 2

## 2014-04-29 MED ORDER — FENTANYL CITRATE 0.05 MG/ML IJ SOLN
INTRAMUSCULAR | Status: AC
  Filled 2014-04-29: qty 5

## 2014-04-29 MED ORDER — ACETAMINOPHEN 650 MG RE SUPP: 650.0000 mg | RECTAL | Status: DC | PRN

## 2014-04-29 MED ORDER — SODIUM CHLORIDE 0.9 % IJ SOLN: 3.0000 mL | Freq: Two times a day (BID) | INTRAMUSCULAR | Status: DC

## 2014-04-29 MED ORDER — LACTATED RINGERS IV SOLN
INTRAVENOUS | Status: DC
  Administered 2014-04-29: 07:00:00 via INTRAVENOUS

## 2014-04-29 MED ORDER — MORPHINE SULFATE 2 MG/ML IJ SOLN: 1.0000 mg | INTRAMUSCULAR | Status: DC | PRN

## 2014-04-29 MED ORDER — SODIUM CHLORIDE 0.9 % IJ SOLN
3.0000 mL | INTRAMUSCULAR | Status: DC | PRN
Start: 2014-04-29 — End: 2014-04-29

## 2014-04-29 MED ORDER — PROPOFOL 10 MG/ML IV BOLUS
INTRAVENOUS | Status: AC
  Filled 2014-04-29: qty 20

## 2014-04-29 MED ORDER — BUPIVACAINE-EPINEPHRINE (PF) 0.5% -1:200000 IJ SOLN
INTRAMUSCULAR | Status: AC
  Filled 2014-04-29: qty 30

## 2014-04-29 MED ORDER — ACETAMINOPHEN 325 MG PO TABS: 650.0000 mg | ORAL_TABLET | ORAL | Status: DC | PRN

## 2014-04-29 MED ORDER — LACTATED RINGERS IV SOLN
INTRAVENOUS | Status: DC | PRN
  Administered 2014-04-29: 09:00:00 via INTRAVENOUS

## 2014-04-29 MED ORDER — ONDANSETRON HCL 4 MG/2ML IJ SOLN
INTRAMUSCULAR | Status: DC | PRN
  Administered 2014-04-29: 4 mg via INTRAVENOUS

## 2014-04-29 MED ORDER — BUPIVACAINE-EPINEPHRINE 0.5% -1:200000 IJ SOLN
INTRAMUSCULAR | Status: DC | PRN
  Administered 2014-04-29: 20 mL

## 2014-04-29 MED ORDER — FENTANYL CITRATE 0.05 MG/ML IJ SOLN
INTRAMUSCULAR | Status: AC
  Filled 2014-04-29: qty 2

## 2014-04-29 MED ORDER — ROCURONIUM BROMIDE 50 MG/5ML IV SOLN
INTRAVENOUS | Status: AC
  Filled 2014-04-29: qty 1

## 2014-04-29 MED ORDER — LIDOCAINE HCL (CARDIAC) 20 MG/ML IV SOLN
INTRAVENOUS | Status: AC
  Filled 2014-04-29: qty 5

## 2014-04-29 MED ORDER — ONDANSETRON HCL 4 MG/2ML IJ SOLN
INTRAMUSCULAR | Status: AC
  Filled 2014-04-29: qty 2

## 2014-04-29 SURGICAL SUPPLY — 47 items
BLADE SURG 10 STRL SS (BLADE) ×3 IMPLANT
BLADE SURG 15 STRL LF DISP TIS (BLADE) ×1 IMPLANT
BLADE SURG 15 STRL SS (BLADE) ×3
BLADE SURG ROTATE 9660 (MISCELLANEOUS) ×2 IMPLANT
CHLORAPREP W/TINT 26ML (MISCELLANEOUS) ×3 IMPLANT
COVER SURGICAL LIGHT HANDLE (MISCELLANEOUS) ×3 IMPLANT
DRAIN PENROSE 1/2X12 LTX STRL (WOUND CARE) ×2 IMPLANT
DRAPE LAPAROTOMY TRNSV 102X78 (DRAPE) ×3 IMPLANT
DRAPE UTILITY XL STRL (DRAPES) ×4 IMPLANT
ELECT CAUTERY BLADE 6.4 (BLADE) ×3 IMPLANT
ELECT REM PT RETURN 9FT ADLT (ELECTROSURGICAL) ×3
ELECTRODE REM PT RTRN 9FT ADLT (ELECTROSURGICAL) ×1 IMPLANT
GLOVE BIO SURGEON STRL SZ7 (GLOVE) ×2 IMPLANT
GLOVE BIOGEL PI IND STRL 7.0 (GLOVE) IMPLANT
GLOVE BIOGEL PI IND STRL 7.5 (GLOVE) IMPLANT
GLOVE BIOGEL PI IND STRL 8 (GLOVE) IMPLANT
GLOVE BIOGEL PI INDICATOR 7.0 (GLOVE) ×2
GLOVE BIOGEL PI INDICATOR 7.5 (GLOVE) ×2
GLOVE BIOGEL PI INDICATOR 8 (GLOVE) ×2
GLOVE ECLIPSE 7.5 STRL STRAW (GLOVE) ×2 IMPLANT
GLOVE SURG SIGNA 7.5 PF LTX (GLOVE) ×3 IMPLANT
GLOVE SURG SS PI 8.0 STRL IVOR (GLOVE) ×4 IMPLANT
GOWN STRL REUS W/ TWL LRG LVL3 (GOWN DISPOSABLE) ×1 IMPLANT
GOWN STRL REUS W/ TWL XL LVL3 (GOWN DISPOSABLE) ×1 IMPLANT
GOWN STRL REUS W/TWL LRG LVL3 (GOWN DISPOSABLE) ×6
GOWN STRL REUS W/TWL XL LVL3 (GOWN DISPOSABLE) ×6
KIT BASIN OR (CUSTOM PROCEDURE TRAY) ×3 IMPLANT
KIT ROOM TURNOVER OR (KITS) ×3 IMPLANT
LIQUID BAND (GAUZE/BANDAGES/DRESSINGS) ×3 IMPLANT
MESH PARIETEX PROGRIP LEFT (Mesh General) ×2 IMPLANT
NDL HYPO 25GX1X1/2 BEV (NEEDLE) ×1 IMPLANT
NEEDLE HYPO 25GX1X1/2 BEV (NEEDLE) ×3 IMPLANT
NS IRRIG 1000ML POUR BTL (IV SOLUTION) ×3 IMPLANT
PACK SURGICAL SETUP 50X90 (CUSTOM PROCEDURE TRAY) ×3 IMPLANT
PAD ARMBOARD 7.5X6 YLW CONV (MISCELLANEOUS) ×3 IMPLANT
PENCIL BUTTON HOLSTER BLD 10FT (ELECTRODE) ×3 IMPLANT
SPECIMEN JAR SMALL (MISCELLANEOUS) IMPLANT
SPONGE LAP 18X18 X RAY DECT (DISPOSABLE) ×3 IMPLANT
SUT MON AB 4-0 PC3 18 (SUTURE) ×3 IMPLANT
SUT SILK 2 0 SH (SUTURE) ×2 IMPLANT
SUT VIC AB 2-0 CT1 27 (SUTURE) ×6
SUT VIC AB 2-0 CT1 TAPERPNT 27 (SUTURE) ×1 IMPLANT
SUT VIC AB 3-0 CT1 27 (SUTURE) ×3
SUT VIC AB 3-0 CT1 TAPERPNT 27 (SUTURE) ×1 IMPLANT
SYR CONTROL 10ML LL (SYRINGE) ×3 IMPLANT
TOWEL OR 17X24 6PK STRL BLUE (TOWEL DISPOSABLE) ×1 IMPLANT
TOWEL OR 17X26 10 PK STRL BLUE (TOWEL DISPOSABLE) ×3 IMPLANT

## 2014-04-29 NOTE — Progress Notes (Signed)
Patient has some memory issues.  Was instructed to take 2 meds this am, but he took all and he was not really sure what side the hernia surgery was to be done on.  Daughter, Gracyn Santillanes @ bedside.

## 2014-04-29 NOTE — Op Note (Signed)
LEFT INGUINAL HERNIA REPAIR, INSERTION OF MESH  Procedure Note  Ernest Turner 04/29/2014   Pre-op Diagnosis: LEFT INGUINAL HERNIA     Post-op Diagnosis: same  Procedure(s): LEFT INGUINAL HERNIA REPAIR INSERTION OF MESH  Surgeon(s): Coralie Keens, MD  Anesthesia: General  Staff:  Circulator: Jaci Standard, RN Scrub Person: Jon Gills Kretschmaier Circulator Assistant: Cyd Silence, RN RN First Assistant: Cyd Silence, RN  Estimated Blood Loss: Minimal                         Coralie Keens A   Date: 04/29/2014  Time: 9:43 AM

## 2014-04-29 NOTE — Anesthesia Preprocedure Evaluation (Addendum)
Anesthesia Evaluation  Patient identified by MRN, date of birth, ID band Patient awake    Reviewed: Allergy & Precautions, H&P , NPO status , Patient's Chart, lab work & pertinent test results, reviewed documented beta blocker date and time   History of Anesthesia Complications (+) Family history of anesthesia reaction  Airway Mallampati: II  TM Distance: >3 FB Neck ROM: Full    Dental no notable dental hx. (+) Edentulous Upper, Edentulous Lower, Dental Advisory Given   Pulmonary former smoker,  breath sounds clear to auscultation  Pulmonary exam normal       Cardiovascular hypertension, Pt. on medications Rhythm:regular Rate:Normal     Neuro/Psych    GI/Hepatic GERD-  Medicated and Controlled,  Endo/Other    Renal/GU      Musculoskeletal  (+) Arthritis -,   Abdominal   Peds  Hematology   Anesthesia Other Findings   Reproductive/Obstetrics                           Anesthesia Physical Anesthesia Plan  ASA: II  Anesthesia Plan: General   Post-op Pain Management:    Induction: Intravenous  Airway Management Planned: Oral ETT  Additional Equipment:   Intra-op Plan:   Post-operative Plan: Extubation in OR  Informed Consent: I have reviewed the patients History and Physical, chart, labs and discussed the procedure including the risks, benefits and alternatives for the proposed anesthesia with the patient or authorized representative who has indicated his/her understanding and acceptance.   Dental advisory given  Plan Discussed with: CRNA and Anesthesiologist  Anesthesia Plan Comments:         Anesthesia Quick Evaluation

## 2014-04-29 NOTE — Anesthesia Postprocedure Evaluation (Signed)
  Anesthesia Post-op Note  Patient: Ernest Turner  Procedure(s) Performed: Procedure(s): LEFT INGUINAL HERNIA REPAIR (Left) INSERTION OF MESH (Left)   Patient is awake and responsive. Pain and nausea are reasonably well controlled. Vital signs are stable and clinically acceptable. Oxygen saturation is clinically acceptable. There are no apparent anesthetic complications at this time. Patient is ready for discharge.

## 2014-04-29 NOTE — Discharge Instructions (Signed)
[facility] _______[facility] ° °UMBILICAL OR INGUINAL HERNIA REPAIR: POST OP INSTRUCTIONS ° °Always review your discharge instruction sheet given to you by the facility where your surgery was performed. °IF YOU HAVE DISABILITY OR FAMILY LEAVE FORMS, YOU MUST BRING THEM TO THE OFFICE FOR PROCESSING.   °DO NOT GIVE THEM TO YOUR DOCTOR. ° °1. A  prescription for pain medication may be given to you upon discharge.  Take your pain medication as prescribed, if needed.  If narcotic pain medicine is not needed, then you may take acetaminophen (Tylenol) or ibuprofen (Advil) as needed. °2. Take your usually prescribed medications unless otherwise directed. °3. If you need a refill on your pain medication, please contact your pharmacy.  They will contact our office to request authorization. Prescriptions will not be filled after 5 pm or on week-ends. °4. You should follow a light diet the first 24 hours after arrival home, such as soup and crackers, etc.  Be sure to include lots of fluids daily.  Resume your normal diet the day after surgery. °5. Most patients will experience some swelling and bruising around the umbilicus or in the groin and scrotum.  Ice packs and reclining will help.  Swelling and bruising can take several days to resolve.  °6. It is common to experience some constipation if taking pain medication after surgery.  Increasing fluid intake and taking a stool softener (such as Colace) will usually help or prevent this problem from occurring.  A mild laxative (Milk of Magnesia or Miralax) should be taken according to package directions if there are no bowel movements after 48 hours. °7. Unless discharge instructions indicate otherwise, you may remove your bandages 24-48 hours after surgery, and you may shower at that time.  You may have steri-strips (small skin tapes) in place directly over the incision.  These strips should be left on the skin for 7-10 days.  If your surgeon used skin glue on the  incision, you may shower in 24 hours.  The glue will flake off over the next 2-3 weeks.  Any sutures or staples will be removed at the office during your follow-up visit. °8. ACTIVITIES:  You may resume regular (light) daily activities beginning the next day--such as daily self-care, walking, climbing stairs--gradually increasing activities as tolerated.  You may have sexual intercourse when it is comfortable.  Refrain from any heavy lifting or straining until approved by your doctor. °a. You may drive when you are no longer taking prescription pain medication, you can comfortably wear a seatbelt, and you can safely maneuver your car and apply brakes. °b. RETURN TO WORK:  __________________________________________________________ °9. You should see your doctor in the office for a follow-up appointment approximately 2-3 weeks after your surgery.  Make sure that you call for this appointment within a day or two after you arrive home to insure a convenient appointment time. °10. OTHER INSTRUCTIONS:  __________________________________________________________________________________________________________________________________________________________________________________________  °WHEN TO CALL YOUR DOCTOR: °1. Fever over 101.0 °2. Inability to urinate °3. Nausea and/or vomiting °4. Extreme swelling or bruising °5. Continued bleeding from incision. °6. Increased pain, redness, or drainage from the incision ° °The clinic staff is available to answer your questions during regular business hours.  Please don’t hesitate to call and ask to speak to one of the nurses for clinical concerns.  If you have a medical emergency, go to the nearest emergency room or call 911.  A surgeon from [facility] is always on call at the hospital ° ° °  [address], [place], [place], [place]  [address] ?  [address], [place], [place]   [address] [phone] ? [phone] ? FAX [phone] Web site:  [email or web address]    General Anesthesia, Care After Refer to this sheet in the next few weeks. These instructions provide you with information on caring for yourself after your procedure. Your health care provider may also give you more specific instructions. Your treatment has been planned according to current medical practices, but problems sometimes occur. Call your health care provider if you have any problems or questions after your procedure. WHAT TO EXPECT AFTER THE PROCEDURE After the procedure, it is typical to experience:  Sleepiness.  Nausea and vomiting. HOME CARE INSTRUCTIONS  For the first 24 hours after general anesthesia:  Have a responsible person with you.  Do not drive a car. If you are alone, do not take public transportation.  Do not drink alcohol.  Do not take medicine that has not been prescribed by your health care provider.  Do not sign important papers or make important decisions.  You may resume a normal diet and activities as directed by your health care provider.  Change bandages (dressings) as directed.  If you have questions or problems that seem related to general anesthesia, call the hospital and ask for the anesthetist or anesthesiologist on call. SEEK MEDICAL CARE IF:  You have nausea and vomiting that continue the day after anesthesia.  You develop a rash. SEEK IMMEDIATE MEDICAL CARE IF:   You have difficulty breathing.  You have chest pain.  You have any allergic problems. Document Released: 06/13/2000 Document Revised: 03/12/2013 Document Reviewed: 09/20/2012 [facility] Patient Information 2015 [place], [place]. This information is not intended to replace advice given to you by your health care provider. Make sure you discuss any questions you have with your health care provider.

## 2014-04-29 NOTE — Interval H&P Note (Signed)
History and Physical Interval Note: no change in H and P  04/29/2014 7:11 AM  Ernest Turner  has presented today for surgery, with the diagnosis of Left Inguinal Hernia  The various methods of treatment have been discussed with the patient and family. After consideration of risks, benefits and other options for treatment, the patient has consented to  Procedure(s): LEFT INGUINAL HERNIA REPAIR WITH MESH (Left) INSERTION OF MESH (Left) as a surgical intervention .  The patient's history has been reviewed, patient examined, no change in status, stable for surgery.  I have reviewed the patient's chart and labs.  Questions were answered to the patient's satisfaction.     [name]

## 2014-04-29 NOTE — Transfer of Care (Signed)
Immediate Anesthesia Transfer of Care Note  Patient: Ernest Turner  Procedure(s) Performed: Procedure(s): LEFT INGUINAL HERNIA REPAIR (Left) INSERTION OF MESH (Left)  Patient Location: PACU  Anesthesia Type:General  Level of Consciousness: awake and alert   Airway & Oxygen Therapy: Patient Spontanous Breathing and Patient connected to nasal cannula oxygen  Post-op Assessment: Report given to RN, Post -op Vital signs reviewed and stable and Patient moving all extremities X 4  Post vital signs: Reviewed and stable  Last Vitals:  Filed Vitals:   04/29/14 0736  BP: 162/76  Pulse: 72  Temp: 36.7 C  Resp: 16    Complications: No apparent anesthesia complications

## 2014-04-29 NOTE — Anesthesia Procedure Notes (Signed)
Procedure Name: Intubation Date/Time: 04/29/2014 9:12 AM Performed by: Ollen Bowl Pre-anesthesia Checklist: Patient identified, Timeout performed, Emergency Drugs available, Suction available and Patient being monitored Patient Re-evaluated:Patient Re-evaluated prior to inductionOxygen Delivery Method: Circle system utilized and Simple face mask Preoxygenation: Pre-oxygenation with 100% oxygen Intubation Type: IV induction Ventilation: Mask ventilation without difficulty Laryngoscope Size: Mac and 4 Grade View: Grade II Tube type: Oral Tube size: 7.5 mm Number of attempts: 1 Airway Equipment and Method: Patient positioned with wedge pillow and Stylet Placement Confirmation: ETT inserted through vocal cords under direct vision,  positive ETCO2 and breath sounds checked- equal and bilateral Secured at: 22 cm Tube secured with: Tape Dental Injury: Teeth and Oropharynx as per pre-operative assessment

## 2014-04-30 ENCOUNTER — Encounter (HOSPITAL_COMMUNITY): Payer: Self-pay | Admitting: Surgery

## 2014-04-30 MED ORDER — KETOROLAC TROMETHAMINE 30 MG/ML IJ SOLN
INTRAMUSCULAR | Status: AC
  Administered 2014-04-29: 15 mg via INTRAVENOUS
  Filled 2014-04-30: qty 1

## 2014-04-30 MED ORDER — ONDANSETRON HCL 4 MG/2ML IJ SOLN
INTRAMUSCULAR | Status: AC
  Administered 2014-04-29: 4 mg via INTRAVENOUS
  Filled 2014-04-30: qty 2

## 2014-04-30 NOTE — Op Note (Signed)
NAME:  KALIEL, BOLDS NO.:  [id number]  MEDICAL RECORD NO.:  [id number]  LOCATION:                                 FACILITY:  PHYSICIAN:  Coralie Keens, M.D. DATE OF BIRTH:  10-28-1940  DATE OF PROCEDURE:  04/29/2014 DATE OF DISCHARGE:  04/29/2014                              OPERATIVE REPORT   PREOPERATIVE DIAGNOSIS:  Left inguinal hernia.  POSTOPERATIVE DIAGNOSIS:  Left inguinal hernia.  PROCEDURE:  Left inguinal hernia repair with mesh.  SURGEON:  Coralie Keens, M.D.  ANESTHESIA:  General and 0.25% Marcaine.  ESTIMATED BLOOD LOSS:  Minimal.  FINDINGS:  The patient was found to have both a direct and indirect left inguinal hernia which was repaired with a piece of Parietex Proceed Pro- Grip mesh.  PROCEDURE IN DETAIL:  The patient was brought to the operating room, identified as Ernest Turner.  He was placed supine on the operating room table and general anesthesia was induced.  His left lower quadrant was prepped and draped in usual sterile fashion.  I performed an ilioinguinal nerve block with Marcaine and then made a longitudinal incision of the groin with a scalpel.  I took this down through Scarpa's fascia with  electrocautery.  External oblique fascia was then identified and opened with the internal and external rings.  The testicular cord and structures were then identified and controlled with a Penrose drain.  The patient had a moderate-sized direct hernia defect in the inguinal floor as well as an indirect hernia sac.  I separated the indirect hernia sac from the cord structures, then tied off the space with a 2-0 silk suture after reducing the contents back into the abdominal cavity.  I then excised the sac with the electrocautery.  I then closed the soft tissue over the floor of the inguinal canal after reducing the sac of the direct hernia.  I then brought a piece of Proceed Pro-Grip mesh onto the field.  I placed it as an onlay  on the inguinal floor, then brought around the cord structures.  I then secured this to the pubic tubercle with a 2-0 Vicryl suture.  Good coverage of the inguinal floor appeared to be achieved.  I then closed the external oblique fascia over top of this running 2-0 Vicryl suture.  I anesthetized the fascia further with Marcaine.  I then closed the Scarpa fascia with interrupted 3-0 Vicryl sutures and closed the skin with running 4-0 Monocryl.  Skin glue was then applied.  The patient tolerated the procedure well.  All the counts were correct at the end of procedure.  The patient was then extubated in the operating room and taken in stable condition to recovery room.     Coralie Keens, M.D.     DB/MEDQ  D:  04/29/2014  T:  04/30/2014  Job:  [id number]

## 2014-08-08 ENCOUNTER — Emergency Department (HOSPITAL_COMMUNITY)
Admission: EM | Admit: 2014-08-08 | Discharge: 2014-08-08 | Discharge status: Against Medical Advice | Payer: Medicare Other | Attending: Emergency Medicine | Admitting: Emergency Medicine

## 2014-08-08 ENCOUNTER — Encounter (HOSPITAL_COMMUNITY): Payer: Self-pay | Admitting: *Deleted

## 2014-08-08 DIAGNOSIS — R011 Cardiac murmur, unspecified: Secondary | ICD-10-CM | POA: Insufficient documentation

## 2014-08-08 DIAGNOSIS — R1030 Lower abdominal pain, unspecified: Secondary | ICD-10-CM | POA: Diagnosis not present

## 2014-08-08 DIAGNOSIS — I10 Essential (primary) hypertension: Secondary | ICD-10-CM | POA: Diagnosis not present

## 2014-08-08 DIAGNOSIS — H9190 Unspecified hearing loss, unspecified ear: Secondary | ICD-10-CM | POA: Diagnosis not present

## 2014-08-08 DIAGNOSIS — Z87891 Personal history of nicotine dependence: Secondary | ICD-10-CM | POA: Insufficient documentation

## 2014-08-08 LAB — URINALYSIS, ROUTINE W REFLEX MICROSCOPIC
Bilirubin Urine: NEGATIVE
GLUCOSE, UA: NEGATIVE mg/dL
HGB URINE DIPSTICK: NEGATIVE
KETONES UR: NEGATIVE mg/dL
Leukocytes, UA: NEGATIVE
Nitrite: NEGATIVE
PROTEIN: NEGATIVE mg/dL
Specific Gravity, Urine: 1.013 (ref 1.005–1.030)
Urobilinogen, UA: 0.2 mg/dL (ref 0.0–1.0)
pH: 6.5 (ref 5.0–8.0)

## 2014-08-08 NOTE — ED Notes (Signed)
Called pt twice for lab draw, no answer.

## 2014-08-08 NOTE — ED Notes (Signed)
Per pt report: pt has had issues with hemorrhoids and pain with urination.  Pt has been seeing Dr. Claudie Leach for this issue in the past but last night the pain has become worse.  Pt now reporting pain in his lower abd area.  Pt's urologist suggested he come to the ER to be checked out.  Pt has been able to urine but urine flow is now a dribble and pt experiences extreme pain. Pt a/o x 4.  Skin warm and dry.

## 2014-08-28 DIAGNOSIS — T66XXXS Radiation sickness, unspecified, sequela: Secondary | ICD-10-CM | POA: Diagnosis not present

## 2014-08-28 DIAGNOSIS — R310 Gross hematuria: Secondary | ICD-10-CM | POA: Diagnosis not present

## 2014-08-28 DIAGNOSIS — N401 Enlarged prostate with lower urinary tract symptoms: Secondary | ICD-10-CM | POA: Diagnosis not present

## 2014-08-28 DIAGNOSIS — N138 Other obstructive and reflux uropathy: Secondary | ICD-10-CM | POA: Diagnosis not present

## 2014-09-30 DIAGNOSIS — R310 Gross hematuria: Secondary | ICD-10-CM | POA: Diagnosis not present

## 2014-09-30 DIAGNOSIS — N99112 Postprocedural membranous urethral stricture: Secondary | ICD-10-CM | POA: Diagnosis not present

## 2014-10-02 ENCOUNTER — Other Ambulatory Visit: Payer: Self-pay | Admitting: Urology

## 2014-10-15 ENCOUNTER — Encounter (HOSPITAL_BASED_OUTPATIENT_CLINIC_OR_DEPARTMENT_OTHER): Payer: Self-pay | Admitting: *Deleted

## 2014-10-15 NOTE — Progress Notes (Signed)
SPOKE W/ DAUGHTER, Ernest Turner, PT MEMORY DIFFICULTY AND POOR HISTORIAN (YOU WILL NEED HER IN PRE-OP).  NPO AFTER MN.  ARRIVE AT [phone]. NEEDS ISTAT.  CURRENT EKG IN CHART AND EPIC.  WILL TAKE AM MEDS W/ SIPS OF WATER.

## 2014-10-16 ENCOUNTER — Encounter (HOSPITAL_BASED_OUTPATIENT_CLINIC_OR_DEPARTMENT_OTHER)
Admission: RE | Disposition: A | Discharge status: Home / Self Care | Payer: Self-pay | Source: Ambulatory Visit | Attending: Urology

## 2014-10-16 ENCOUNTER — Ambulatory Visit (HOSPITAL_BASED_OUTPATIENT_CLINIC_OR_DEPARTMENT_OTHER): Payer: Medicare Other | Admitting: Anesthesiology

## 2014-10-16 ENCOUNTER — Encounter (HOSPITAL_BASED_OUTPATIENT_CLINIC_OR_DEPARTMENT_OTHER): Payer: Self-pay | Admitting: Anesthesiology

## 2014-10-16 ENCOUNTER — Ambulatory Visit (HOSPITAL_BASED_OUTPATIENT_CLINIC_OR_DEPARTMENT_OTHER)
Admission: RE | Admit: 2014-10-16 | Discharge: 2014-10-16 | Disposition: A | Discharge status: Home / Self Care | Payer: Medicare Other | Source: Ambulatory Visit | Attending: Urology | Admitting: Urology

## 2014-10-16 DIAGNOSIS — K219 Gastro-esophageal reflux disease without esophagitis: Secondary | ICD-10-CM | POA: Insufficient documentation

## 2014-10-16 DIAGNOSIS — F329 Major depressive disorder, single episode, unspecified: Secondary | ICD-10-CM | POA: Diagnosis not present

## 2014-10-16 DIAGNOSIS — N359 Urethral stricture, unspecified: Secondary | ICD-10-CM | POA: Insufficient documentation

## 2014-10-16 DIAGNOSIS — Z923 Personal history of irradiation: Secondary | ICD-10-CM | POA: Diagnosis not present

## 2014-10-16 DIAGNOSIS — E780 Pure hypercholesterolemia: Secondary | ICD-10-CM | POA: Insufficient documentation

## 2014-10-16 DIAGNOSIS — N99112 Postprocedural membranous urethral stricture: Secondary | ICD-10-CM | POA: Diagnosis not present

## 2014-10-16 DIAGNOSIS — Z87891 Personal history of nicotine dependence: Secondary | ICD-10-CM | POA: Insufficient documentation

## 2014-10-16 DIAGNOSIS — E291 Testicular hypofunction: Secondary | ICD-10-CM | POA: Diagnosis not present

## 2014-10-16 DIAGNOSIS — Z9114 Patient's other noncompliance with medication regimen: Secondary | ICD-10-CM | POA: Diagnosis not present

## 2014-10-16 DIAGNOSIS — Z79899 Other long term (current) drug therapy: Secondary | ICD-10-CM | POA: Insufficient documentation

## 2014-10-16 DIAGNOSIS — N5201 Erectile dysfunction due to arterial insufficiency: Secondary | ICD-10-CM | POA: Insufficient documentation

## 2014-10-16 DIAGNOSIS — M1990 Unspecified osteoarthritis, unspecified site: Secondary | ICD-10-CM | POA: Insufficient documentation

## 2014-10-16 DIAGNOSIS — C61 Malignant neoplasm of prostate: Secondary | ICD-10-CM | POA: Diagnosis not present

## 2014-10-16 DIAGNOSIS — N99111 Postprocedural bulbous urethral stricture: Secondary | ICD-10-CM

## 2014-10-16 DIAGNOSIS — R319 Hematuria, unspecified: Secondary | ICD-10-CM | POA: Diagnosis present

## 2014-10-16 DIAGNOSIS — I10 Essential (primary) hypertension: Secondary | ICD-10-CM | POA: Diagnosis not present

## 2014-10-16 HISTORY — DX: Personal history of peptic ulcer disease: Z87.11

## 2014-10-16 HISTORY — DX: Personal history of irradiation: Z92.3

## 2014-10-16 HISTORY — DX: Hyperlipidemia, unspecified: E78.5

## 2014-10-16 HISTORY — DX: Personal history of other specified conditions: Z87.898

## 2014-10-16 HISTORY — DX: Unspecified symptoms and signs involving the genitourinary system: R39.9

## 2014-10-16 HISTORY — DX: Other specified health status: Z78.9

## 2014-10-16 HISTORY — PX: CYSTOSCOPY: SHX5120

## 2014-10-16 HISTORY — DX: Other amnesia: R41.3

## 2014-10-16 LAB — POCT I-STAT 4, (NA,K, GLUC, HGB,HCT)
Glucose, Bld: 106 mg/dL — ABNORMAL HIGH (ref 65–99)
HEMATOCRIT: 44 % (ref 39.0–52.0)
Hemoglobin: 15 g/dL (ref 13.0–17.0)
Potassium: 3.4 mmol/L — ABNORMAL LOW (ref 3.5–5.1)
SODIUM: 142 mmol/L (ref 135–145)

## 2014-10-16 SURGERY — CYSTOSCOPY
Anesthesia: General | Laterality: Right

## 2014-10-16 MED ORDER — LIDOCAINE HCL (CARDIAC) 20 MG/ML IV SOLN
INTRAVENOUS | Status: DC | PRN
  Administered 2014-10-16: 60 mg via INTRAVENOUS

## 2014-10-16 MED ORDER — STERILE WATER FOR IRRIGATION IR SOLN
Status: DC | PRN
  Administered 2014-10-16: 20 mL via INTRAVESICAL

## 2014-10-16 MED ORDER — TAMSULOSIN HCL 0.4 MG PO CAPS: 0.4000 mg | ORAL_CAPSULE | Freq: Every day | ORAL | Status: DC

## 2014-10-16 MED ORDER — ONDANSETRON HCL 4 MG/2ML IJ SOLN
INTRAMUSCULAR | Status: DC | PRN
  Administered 2014-10-16: 4 mg via INTRAVENOUS

## 2014-10-16 MED ORDER — STERILE WATER FOR IRRIGATION IR SOLN
Status: DC | PRN
  Administered 2014-10-16: 500 mL

## 2014-10-16 MED ORDER — KETOROLAC TROMETHAMINE 15 MG/ML IJ SOLN
INTRAMUSCULAR | Status: DC | PRN
  Administered 2014-10-16: 15 mg via INTRAVENOUS

## 2014-10-16 MED ORDER — PROPOFOL 10 MG/ML IV BOLUS
INTRAVENOUS | Status: DC | PRN
  Administered 2014-10-16: 50 mg via INTRAVENOUS
  Administered 2014-10-16: 150 mg via INTRAVENOUS

## 2014-10-16 MED ORDER — DEXAMETHASONE SODIUM PHOSPHATE 4 MG/ML IJ SOLN
INTRAMUSCULAR | Status: DC | PRN
  Administered 2014-10-16: 10 mg via INTRAVENOUS

## 2014-10-16 MED ORDER — TRIMETHOPRIM 100 MG PO TABS: 100.0000 mg | ORAL_TABLET | ORAL | Status: DC

## 2014-10-16 MED ORDER — FENTANYL CITRATE (PF) 100 MCG/2ML IJ SOLN
INTRAMUSCULAR | Status: DC | PRN
  Administered 2014-10-16: 25 ug via INTRAVENOUS
  Administered 2014-10-16: 50 ug via INTRAVENOUS

## 2014-10-16 MED ORDER — ACETAMINOPHEN 10 MG/ML IV SOLN
INTRAVENOUS | Status: DC | PRN
  Administered 2014-10-16: 1000 mg via INTRAVENOUS

## 2014-10-16 MED ORDER — CEFAZOLIN SODIUM-DEXTROSE 2-3 GM-% IV SOLR
INTRAVENOUS | Status: AC
  Filled 2014-10-16: qty 50

## 2014-10-16 MED ORDER — LACTATED RINGERS IV SOLN
INTRAVENOUS | Status: DC
  Administered 2014-10-16: 10:00:00 via INTRAVENOUS
  Filled 2014-10-16: qty 1000

## 2014-10-16 MED ORDER — CEFAZOLIN SODIUM-DEXTROSE 2-3 GM-% IV SOLR
2.0000 g | INTRAVENOUS | Status: AC
  Administered 2014-10-16: 2 g via INTRAVENOUS
  Filled 2014-10-16: qty 50

## 2014-10-16 MED ORDER — FENTANYL CITRATE (PF) 100 MCG/2ML IJ SOLN
25.0000 ug | INTRAMUSCULAR | Status: DC | PRN
  Filled 2014-10-16: qty 1

## 2014-10-16 MED ORDER — BELLADONNA ALKALOIDS-OPIUM 16.2-60 MG RE SUPP
RECTAL | Status: AC
  Filled 2014-10-16: qty 1

## 2014-10-16 MED ORDER — PHENAZOPYRIDINE HCL 200 MG PO TABS: 200.0000 mg | ORAL_TABLET | Freq: Three times a day (TID) | ORAL | Status: DC | PRN

## 2014-10-16 MED ORDER — MIDAZOLAM HCL 2 MG/2ML IJ SOLN
INTRAMUSCULAR | Status: AC
  Filled 2014-10-16: qty 2

## 2014-10-16 MED ORDER — FENTANYL CITRATE (PF) 100 MCG/2ML IJ SOLN
INTRAMUSCULAR | Status: AC
Start: 2014-10-16 — End: 2014-10-16
  Filled 2014-10-16: qty 2

## 2014-10-16 SURGICAL SUPPLY — 26 items
BAG DRAIN URO-CYSTO SKYTR STRL (DRAIN) ×3 IMPLANT
BAG DRN UROCATH (DRAIN) ×1
BALLN NEPHROSTOMY (BALLOONS) ×3
BALLOON NEPHROSTOMY (BALLOONS) IMPLANT
BOOTIES KNEE HIGH SLOAN (MISCELLANEOUS) ×1 IMPLANT
CANISTER SUCT LVC 12 LTR MEDI- (MISCELLANEOUS) IMPLANT
CATH ROBINSON RED A/P 16FR (CATHETERS) ×2 IMPLANT
CLOTH BEACON ORANGE TIMEOUT ST (SAFETY) ×3 IMPLANT
ELECT REM PT RETURN 9FT ADLT (ELECTROSURGICAL) ×3
ELECTRODE REM PT RTRN 9FT ADLT (ELECTROSURGICAL) ×1 IMPLANT
GLOVE BIO SURGEON STRL SZ7.5 (GLOVE) ×3 IMPLANT
GOWN STRL REUS W/ TWL LRG LVL3 (GOWN DISPOSABLE) ×1 IMPLANT
GOWN STRL REUS W/ TWL XL LVL3 (GOWN DISPOSABLE) ×1 IMPLANT
GOWN STRL REUS W/TWL LRG LVL3 (GOWN DISPOSABLE) ×3
GOWN STRL REUS W/TWL XL LVL3 (GOWN DISPOSABLE) ×3
MANIFOLD NEPTUNE II (INSTRUMENTS) ×2 IMPLANT
NDL SAFETY ECLIPSE 18X1.5 (NEEDLE) IMPLANT
NDL SPNL 22GX7 QUINCKE BK (NEEDLE) IMPLANT
NEEDLE HYPO 18GX1.5 SHARP (NEEDLE)
NEEDLE HYPO 22GX1.5 SAFETY (NEEDLE) IMPLANT
NEEDLE SPNL 22GX7 QUINCKE BK (NEEDLE) IMPLANT
NS IRRIG 500ML POUR BTL (IV SOLUTION) IMPLANT
PACK CYSTO (CUSTOM PROCEDURE TRAY) ×3 IMPLANT
SYR 20CC LL (SYRINGE) IMPLANT
WATER STERILE IRR 3000ML UROMA (IV SOLUTION) ×3 IMPLANT
WATER STERILE IRR 500ML POUR (IV SOLUTION) ×2 IMPLANT

## 2014-10-16 NOTE — Transfer of Care (Signed)
Immediate Anesthesia Transfer of Care Note  Patient: Ernest Turner  Procedure(s) Performed: Procedure(s): CYSTO, RIGHT RETROGRADE COOK BALLOON DILATION RIGHT PROXIMAL URETHERAL STRICTURE (Right)  Patient Location: PACU  Anesthesia Type:General  Level of Consciousness: awake, alert , oriented and patient cooperative  Airway & Oxygen Therapy: Patient Spontanous Breathing and Patient connected to nasal cannula oxygen  Post-op Assessment: Report given to RN and Post -op Vital signs reviewed and stable  Post vital signs: Reviewed and stable  Last Vitals:  Filed Vitals:   10/16/14 0909  BP: 159/74  Pulse: 72  Temp: 37 C  Resp: 12    Complications: No apparent anesthesia complications

## 2014-10-16 NOTE — Discharge Instructions (Addendum)
Urethrotomy Urethrotomy is a surgical procedure to treat a section of the urethra that is too narrow. The urethra is the tube that carries urine from your bladder out through the tip of your penis. Narrowing of the urethra (urethral stricture) makes it hard or painful to pass urine. You may also be at risk for more frequent urinary infections. Scarring from infection or injury are common causes of urethral stricture. During urethrotomy, your surgeon first passes a thin telescope (cystoscope) into your urethra. Then the surgeon uses a cutting instrument (cold knife) or a heat source (hot knife) to remove any urethral strictures.  LET [facility] CARE PROVIDER KNOW ABOUT:  Any allergies you have.  All medicines you are taking, including vitamins, herbs, eye drops, creams, and over-the-counter medicines.  Previous problems you or members of your family have had with the use of anesthetics.  Any blood disorders you have.  Previous surgeries you have had.  Medical conditions you have.  Any pacemaker or defibrillator. RISKS AND COMPLICATIONS Generally, this is a safe procedure. However, as with any procedure, problems can occur. The most common problem is having another urethral stricture after surgery. Other less common problems include:   Bleeding.  Burning pain.  Infection.  Needing to put a tube (catheter) in your urethra to prevent the stricture from happening again.  Trouble getting an erection (erectile dysfunction). BEFORE THE PROCEDURE  Do not eat or drink anything after midnight on the night before the procedure or as directed by your health care provider.  Ask your health care provider about changing or stopping your regular medicines. This is especially important if you are taking diabetes medicines or blood thinners.  You may need to have a urine test to check for signs of infection.  You may get antibiotic medicines by injection or through an IV access just before the  procedure. This is to prevent infection.  Take a shower before coming to the hospital for surgery. PROCEDURE This procedure usually takes about 30 minutes. During the procedure, the following may happen:  You will be given one of the following:  A medicine that makes you go to sleep (general anesthetic).  A medicine injected into your spine that numbs your body below the waist (spinal anesthetic).  The tip of your penis will be cleaned with a germ-killing (antibacterial) solution.  The surgeon will pass the cystoscope into your urethra.  Urethral strictures will be cut with the cold or hot knife.  You will most likely have a catheter inserted through your urethra and into your bladder. This is to drain your urine. AFTER THE PROCEDURE  You will stay in the recovery area until the anesthesia wears off and your health care provider says you can go home. Follow all instructions carefully.  Take all medicines as directed by your health care provider.  You may get prescriptions for antibiotics and a pain reliever.  You may have a small bandage on the tip of your penis.  You may be sent home with a catheter in place. Follow your health care provider's instructions on how to care for the catheter at home. You will need to return to have the catheter removed as directed by your health care provider.  You may see some blood leaking around the catheter when you pass urine.  Drink enough fluid to keep your urine clear or pale yellow.  Ask your health care provider when you can go back to your normal activities after the catheter is removed.  Keep all follow-up appointments. Document Released: 03/12/2013 Document Reviewed: 03/12/2013 [facility] Patient Information 2015 [place], [place]. This information is not intended to replace advice given to you by your health care provider. Make sure you discuss any questions you have with your health care provider.  Post Anesthesia Home Care  Instructions  Activity: Get plenty of rest for the remainder of the day. A responsible adult should stay with you for 24 hours following the procedure.  For the next 24 hours, DO NOT: -Drive a car -Paediatric nurse -Drink alcoholic beverages -Take any medication unless instructed by your physician -Make any legal decisions or sign important papers.  Meals: Start with liquid foods such as gelatin or soup. Progress to regular foods as tolerated. Avoid greasy, spicy, heavy foods. If nausea and/or vomiting occur, drink only clear liquids until the nausea and/or vomiting subsides. Call your physician if vomiting continues.  Special Instructions/Symptoms: Your throat may feel dry or sore from the anesthesia or the breathing tube placed in your throat during surgery. If this causes discomfort, gargle with warm salt water. The discomfort should disappear within 24 hours.  If you had a scopolamine patch placed behind your ear for the management of post- operative nausea and/or vomiting:  1. The medication in the patch is effective for 72 hours, after which it should be removed.  Wrap patch in a tissue and discard in the trash. Wash hands thoroughly with soap and water. 2. You may remove the patch earlier than 72 hours if you experience unpleasant side effects which may include dry mouth, dizziness or visual disturbances. 3. Avoid touching the patch. Wash your hands with soap and water after contact with the patch.

## 2014-10-16 NOTE — H&P (Signed)
Reason For Visit Cystoscopy   Active Problems Problems  1. Delayed effect of radiation (T66.XXXS)   Assessed By: Hillary Bow (Urology); Last Assessed: 30 Sep 2014 2. Erectile dysfunction due to arterial insufficiency (N52.01)   Assessed By: Carolan Clines (Urology); Last Assessed: 15 Nov 2011 3. Gross hematuria (R31.0)   Assessed By: Hillary Bow (Urology); Last Assessed: 30 Sep 2014 4. Hypogonadism, testicular (E29.1)   Assessed By: Carolan Clines (Urology); Last Assessed: 25 Mar 2014 5. Prostate cancer (C61)   Assessed By: Hillary Bow (Urology); Last Assessed: 30 Sep 2014  History of Present Illness     74 year old AA male returns today for a cystoscopy for hx of gross hematuria. He was last seen by Jiles Crocker, NP on 08/28/14 for an overdue f/u & having prostate pain, and dysuria.  He was scheduled for f/u in April of this year and did not show up. He endorses having a continued weak stream and having to strain for urination. He is unsure of the medications he is taking and has a documented history of non-compliance. His reason for presenting today is painless hematuria. He noted a few drops of blood at the beginning of urination. He denies dysuria or burning. He has not seen in any other drops of blood since then. He denies fever, uncontrollable pain, or recent infection.     He is s/p Gold seed placement on 03/07/12 for hx of prostate caner. He is s/p IMRT x 40 sessions, 04/11/12 - 06/11/12 by Dr. Valere Dross. He is s/p prostate biopsy on 12/26/11. IPSS=12, but IPSS= 25 today.. Prostate size 27.98cc at the time of diagnosis. No erections for 4 years. He had a Lupron 45mg  (19mo) injection on 01/19/12.      01/02/12 labs: Vit D - 10, Calcium - 9.5, Creatinine - 0.98, GFR - >89, and Testosterone - 557.88      Previous hx of BPH with HGPIN and hypogonadism. He had significant improvement with erections and generalized strength and energy with Androgel daily, but unable to tolerate  due to rash and chest pain with Testim. Recommended that he begin monthly testosterone injections in April 2010, but never returned for injections. He complains of low libido, and intolerance to gels. He is using vacuum pump successfully. Last Testosterone level was on 10/21/09 of 223.0.     He has good control of urgency/frequency when he drinks adequate water and takes medication; Terazosin 5 mg 1 po daily and taking Oxybutynin 10 mg 1 po daily . Denies hematuria or dysuria.      PSA 22.40 March 2010. Hx HgPin: 2005, negative bx 2008; and low grade PIN: 2009.    PSA of 21.70 ( was 23.20) in Oct. 2010.    PSA on 10/21/09 had decreased to 11.7 (corrected value of 23.4 due to Finasteride)  11/03/10 PSA - 12.59  11/10/11 PSA - 15.84  03/25/14  PSA: 0.14  03/25/14 Testosterone: 540   Past Medical History Problems  1. History of Anxiety (F41.9) 2. History of Arthritis 3. History of depression (Z86.59) 4. History of esophageal reflux (Z87.19) 5. History of hypercholesterolemia (Z86.39) 6. History of hypertension (Z86.79) 7. History of Murmur (R01.1) 8. History of Nocturia (R35.1) 9. History of Peptic Ulcer  Surgical History Problems  1. History of Hernia Repair 2. History of Oral Surgery 3. History of Radiation Therapy  Current Meds 1. AmLODIPine Besylate 10 MG Oral Tablet;  Therapy: (Recorded:06May2008) to Recorded 2. Diclofenac Sodium 75 MG Oral Tablet Delayed Release;  Therapy: [id number]  to Recorded 3. Finasteride 5 MG Oral Tablet; Take 1 tablet by mouth daily;  Therapy: [id number] to (Evaluate:30Jan2017)  Requested for: [id number]; Last  Rx:05Feb2016 Ordered 4. Klor-Con 10 10 MEQ Oral Tablet Extended Release;  Therapy: 26Aug2013 to Recorded 5. Klor-Con M10 10 MEQ Oral Tablet Extended Release;  Therapy: (Recorded:06May2008) to Recorded 6. Norvasc TABS;  Therapy: (Recorded:10Aug2011) to Recorded 7. Oxybutynin Chloride ER 10 MG Oral Tablet Extended Release 24 Hour; Take one  (1)  tablet(s) once daily;  Therapy: 10FBP1025 to (Last Rx:04Sep2012)  Requested for: 04Sep2012 Ordered 8. Tamsulosin HCl - 0.4 MG Oral Capsule; TAKE 1 CAPSULE Bedtime;  Therapy: [id number] to (Last Rx:05Jan2016)  Requested for: 35TIR4431 Ordered 9. Terazosin HCl - 5 MG Oral Capsule;  Therapy: (Recorded:06May2008) to Recorded 10. Vitamin D (Ergocalciferol) 50000 UNIT Oral Capsule; TAKE 1 CAPSULE WEEKLY;   Therapy: [phone] to (Last Rx:17Oct2013)  Requested for: 17Oct2013 Ordered  Allergies Medication  1. AndroGel GEL 2. Testim GEL 3. Viagra TABS  Family History Problems  1. Family history of Cancer   sibling 2. Family history of Diabetes Mellitus : Mother 3. Family history of Diabetes Mellitus   sibling 4. Family history of Family Health Status Number Of Children   1 son 3 daughters 74. Family history of Heart Disease : Father 61. Family history of Tuberculosis   sibling  Social History Problems  1. Denied: Alcohol Use 2. Caffeine Use   2 3. Former smoker [phone]) 4. Marital History - Currently Married 5. Occupation:   retired 20. History of Tobacco Use   2 packs, 8 years  Review of Systems Genitourinary, constitutional, skin, hematologic/lymphatic, cardiovascular, pulmonary, endocrine, musculoskeletal, gastrointestinal, neurological and psychiatric system(s) were reviewed and pertinent findings if present are noted and are otherwise negative.  Genitourinary: weak urinary stream, incomplete emptying of bladder, hematuria and initiating urination requires straining, but no urinary frequency, no feelings of urinary urgency, no dysuria and no incontinence.  Gastrointestinal: pain when defecating. Hemorrhoids.    Vitals Vital Signs [Data Includes: Last 1 Day]  Recorded: 12Jul2016 11:23AM  Blood Pressure: 152 / 77 Temperature: 97.6 F Heart Rate: 67  Physical Exam Constitutional: Well nourished and well developed . In acute distress. The patient appears well  hydrated.  ENT:. The ears and nose are normal in appearance. Examination of the teeth show poor dentition.  Neck: The appearance of the neck is normal.  Pulmonary: No respiratory distress.  Cardiovascular:. No peripheral edema.  Abdomen: The abdomen is mildly obese. The abdomen is soft and nontender. No masses are palpated. No CVA tenderness. No hernias are palpable. No hepatosplenomegaly noted.  Rectal: Rectal exam demonstrates an external hemorrhoid, but decreased sphincter tone and no fecal impaction. Exam was positive for occult blood. Estimated prostate size is 3+. Large external hemorrhoids. The prostate has no nodularity, is not indurated, is not tender and is not fluctuant. The left seminal vesicle is nonpalpable. The perineum is normal on inspection.  Genitourinary: Examination of the penis demonstrates no swelling, no discharge, no masses, no adherence of the prepuce, no phimosis, no paraphimosis, no balanitis, no priapism, no lesions and a normal meatus. The penis is uncircumcised. The scrotum is normal in appearance and without lesions. The right epididymis is palpably normal and non-tender. The left epididymis is palpably normal and non-tender. The right testis is palpably normal, non-tender and without masses. The left testis is normal, non-tender and without masses.  Lymphatics: The femoral and inguinal nodes are not enlarged or tender.  Skin: Normal skin turgor, no visible  rash and no visible skin lesions.  Neuro/Psych:. Mood and affect are appropriate.    Results/Data  30 Sep 2014 11:01 AM  UA With REFLEX    COLOR YELLOW     APPEARANCE CLEAR     SPECIFIC GRAVITY 1.025     pH 6.0     GLUCOSE NEG     BILIRUBIN NEG     KETONE NEG     BLOOD LARGE     PROTEIN NEG     UROBILINOGEN 0.2     NITRITE NEG     LEUKOCYTE ESTERASE NEG     SQUAMOUS EPITHELIAL/HPF RARE     WBC 0-2     CRYSTALS NONE SEEN     CASTS NONE SEEN     RBC 21-50     BACTERIA RARE      Procedure  Procedure: Cystoscopy  Chaperone Present: kim lewis.  Indication: Hematuria.  Informed Consent: Risks, benefits, and potential adverse events were discussed and informed consent was obtained from the patient.  Prep: The patient was prepped with betadine.  Anesthesia:. Local anesthesia was administered intraurethrally with 2% lidocaine jelly.  Antibiotic prophylaxis: Ciprofloxacin.  Procedure Note:  Urethral meatus:. No abnormalities.  Anterior urethra: No abnormalities.  Prostatic urethra:. There was visual obstruction of the prostatic urethra. A pinpoint and severe stricture was present in the membranous urethra and an attempt was made to dilate the stricture but was unsuccessful.  Bladder: Bladder could not be vizualized.    Assessment Assessed  1. Gross hematuria (R31.0) 2. Delayed effect of radiation (T66.XXXS) 3. Prostate cancer (C61) 4. Postprocedural membranous urethral stricture (L37.342)  Mr Brigham has a pinpoint proximal membranous urethral stricture, thought 2ndary Radiation for CaP, which will require Cook balloon dilation under anesthesia, and then cystostopy can be completed. This will be accomplished as soon as possible.   Plan  We will arrange to have patient have cysto, and Cook balloon dilation of dense proximal inflammatory urehtral stricutre. UA With REFLEX; [Do Not Release]; Status:Resulted - Requires Verification;  Done: [id number] 12:00AM  Due:14Jul2016; Marked Important;Ordered; Today;   IOM:BTDHRC Maintenance; Ordered BU:LAGTXMIWOE, Della Scrivener;   Electronics engineer signed by : Carolan Clines, M.D.; Sep 30 2014  2:48PM EST

## 2014-10-16 NOTE — Interval H&P Note (Signed)
History and Physical Interval Note:  10/16/2014 9:51 AM  Ernest Turner  has presented today for surgery, with the diagnosis of URETHERAL STRICTURE  The various methods of treatment have been discussed with the patient and family. After consideration of risks, benefits and other options for treatment, the patient has consented to  Procedure(s): CYSTO, COOK BALLOON DILATION URETHERAL STRICTURE (N/A) as a surgical intervention .  The patient's history has been reviewed, patient examined, no change in status, stable for surgery.  I have reviewed the patient's chart and labs.  Questions were answered to the patient's satisfaction.     Charlese Gruetzmacher I Trudie Cervantes

## 2014-10-16 NOTE — Op Note (Signed)
Pre-operative diagnosis :   Dense penoscrotal junction urethral stricture (2 cm length)  Postoperative diagnosis: Same    Operation: Cystourethroscopy,  retrograde urethrogram with interpretation, Cook balloon dilation of dense penoscrotal urethral stricture (2 cm length)   Surgeon:  S. Gaynelle Arabian, MD  First assistant: None     Anesthesia:  general LMA   Preparation: After appropriate preanesthesia, the patient was brought the operating room, placed on the operating table in the dorsal supine position where general LMA anesthesia was introduced. He was then replaced in the dorsal lithotomy position with the pubis was prepped with Betadine solution and draped in the usual fashion. The history was double checked. The arm band was double checked.   Review history:  Problems  1. Delayed effect of radiation (T66.XXXS)  Assessed By: Hillary Bow (Urology); Last Assessed: 30 Sep 2014 2. Erectile dysfunction due to arterial insufficiency (N52.01)  Assessed By: Carolan Clines (Urology); Last Assessed: 15 Nov 2011 3. Gross hematuria (R31.0)  Assessed By: Hillary Bow (Urology); Last Assessed: 30 Sep 2014 4. Hypogonadism, testicular (E29.1)  Assessed By: Carolan Clines (Urology); Last Assessed: 25 Mar 2014 5. Prostate cancer (C61)  Assessed By: Hillary Bow (Urology); Last Assessed: 30 Sep 2014  History of Present Illness    74 year old AA male returns today for a cystoscopy for hx of gross hematuria. He was last seen by Jiles Crocker, NP on 08/28/14 for an overdue f/u & having prostate pain, and dysuria. He was scheduled for f/u in April of this year and did not show up. He endorses having a continued weak stream and having to strain for urination. He is unsure of the medications he is taking and has a documented history of non-compliance. His reason for presenting today is painless hematuria. He noted a few drops of blood at the beginning of urination. He denies dysuria or burning.  He has not seen in any other drops of blood since then. He denies fever, uncontrollable pain, or recent infection.    He is s/p Gold seed placement on 03/07/12 for hx of prostate caner. He is s/p IMRT x 40 sessions, 04/11/12 - 06/11/12 by Dr. Valere Dross. He is s/p prostate biopsy on 12/26/11. IPSS=12, but IPSS= 25 today.. Prostate size 27.98cc at the time of diagnosis. No erections for 4 years. He had a Lupron $Remove'45mg'XEvkYmy$  (83mo) injection on 01/19/12.     01/02/12 labs: Vit D - 10, Calcium - 9.5, Creatinine - 0.98, GFR - >89, and Testosterone - 557.88    Statement of  Likelihood of Success: Excellent. TIME-OUT observed.:  Procedure:  Cystourethroscopy was pushed with the 23 French cystoscope. The distal urethra was within normal limits. The penoscrotal junction was obliterated, and I was unable to see any normal urethra. The patient did give a history of ability to void by dribbling, however. A 16 French red Robinson catheter was placed in the urethra, and retrograde urethra was accomplished, showing no contrast proximal to the strictured area. Repeat cystoscopy was then accomplished, and a 0.038 floppy tip guidewire was passed through the scope, into the stricture. The guidewire was able to find its way through the strictured area, into the bladder under fluoroscopic control. Over this wire, a Cook balloon dilator was passed, into the bladder. Cook balloon dilation was then accomplished to 18 atm pressure for 5 minutes. The balloon dilator was then removed, and repeat cystoscopy was accomplished over a safety wire and the bladder. The cystoscope easily went into the bladder. Photodocumentation was accomplished. The safety  wire was removed. I felt that the patient needed a Foley catheter to help begin the healing process. He is at high-risk of reforming his urethral stricture. Therefore, 18 French 5 mL Foley catheter was placed. The patient was awakened after given IV Toradol, and taken to recovery room in good  condition. He was covered with IV antibodies.

## 2014-10-16 NOTE — Anesthesia Postprocedure Evaluation (Signed)
  Anesthesia Post-op Note  Patient: Ernest Turner  Procedure(s) Performed: Procedure(s): CYSTO, RIGHT RETROGRADE COOK BALLOON DILATION RIGHT PROXIMAL URETHERAL STRICTURE (Right)  Patient Location: PACU  Anesthesia Type:General  Level of Consciousness: awake and alert   Airway and Oxygen Therapy: Patient Spontanous Breathing  Post-op Pain: mild  Post-op Assessment: Post-op Vital signs reviewed              Post-op Vital Signs: stable  Last Vitals:  Filed Vitals:   10/16/14 1115  BP: 136/74  Pulse: 52  Temp:   Resp: 12    Complications: No apparent anesthesia complications

## 2014-10-16 NOTE — Anesthesia Preprocedure Evaluation (Addendum)
Anesthesia Evaluation  Patient identified by MRN, date of birth, ID band Patient awake    Reviewed: Allergy & Precautions, H&P , NPO status , Patient's Chart, lab work & pertinent test results, reviewed documented beta blocker date and time   Airway Mallampati: II  TM Distance: >3 FB Neck ROM: Full    Dental no notable dental hx. (+) Edentulous Upper, Edentulous Lower, Dental Advisory Given   Pulmonary former smoker,  breath sounds clear to auscultation  Pulmonary exam normal       Cardiovascular hypertension, Pt. on medications + Valvular Problems/Murmurs Rhythm:regular Rate:Normal  21-Jan-2014  Normal sinus rhythm with sinus arrhythmia Possible Left atrial enlargement Nonspecific T wave abnormality Abnormal ECG Since last tracing rate slower Premature ventricular complexes resolved.   Neuro/Psych Depression    GI/Hepatic GERD-  Medicated,  Endo/Other    Renal/GU      Musculoskeletal  (+) Arthritis -, Osteoarthritis,    Abdominal   Peds  Hematology   Anesthesia Other Findings   Reproductive/Obstetrics                          Anesthesia Physical Anesthesia Plan  ASA: III  Anesthesia Plan: General   Post-op Pain Management:    Induction: Intravenous  Airway Management Planned: LMA  Additional Equipment:   Intra-op Plan:   Post-operative Plan: Extubation in OR  Informed Consent: I have reviewed the patients History and Physical, chart, labs and discussed the procedure including the risks, benefits and alternatives for the proposed anesthesia with the patient or authorized representative who has indicated his/her understanding and acceptance.   Dental advisory given  Plan Discussed with: CRNA and Surgeon  Anesthesia Plan Comments:         Anesthesia Quick Evaluation

## 2014-10-16 NOTE — Anesthesia Procedure Notes (Signed)
Procedure Name: LMA Insertion Date/Time: 10/16/2014 10:03 AM Performed by: Wanita Chamberlain Pre-anesthesia Checklist: Patient identified, Timeout performed, Emergency Drugs available, Suction available and Patient being monitored Patient Re-evaluated:Patient Re-evaluated prior to inductionOxygen Delivery Method: Circle system utilized Preoxygenation: Pre-oxygenation with 100% oxygen Intubation Type: IV induction Ventilation: Mask ventilation without difficulty LMA: LMA inserted Number of attempts: 1 Placement Confirmation: positive ETCO2 and breath sounds checked- equal and bilateral Tube secured with: Tape Dental Injury: Teeth and Oropharynx as per pre-operative assessment

## 2014-10-17 ENCOUNTER — Encounter (HOSPITAL_BASED_OUTPATIENT_CLINIC_OR_DEPARTMENT_OTHER): Payer: Self-pay | Admitting: Urology

## 2015-05-21 DIAGNOSIS — H25013 Cortical age-related cataract, bilateral: Secondary | ICD-10-CM | POA: Diagnosis not present

## 2015-05-21 DIAGNOSIS — H2513 Age-related nuclear cataract, bilateral: Secondary | ICD-10-CM | POA: Diagnosis not present

## 2015-05-21 DIAGNOSIS — H43813 Vitreous degeneration, bilateral: Secondary | ICD-10-CM | POA: Diagnosis not present

## 2015-05-28 DIAGNOSIS — H25812 Combined forms of age-related cataract, left eye: Secondary | ICD-10-CM | POA: Diagnosis not present

## 2015-05-28 DIAGNOSIS — H25811 Combined forms of age-related cataract, right eye: Secondary | ICD-10-CM | POA: Diagnosis not present

## 2015-06-17 DIAGNOSIS — Z125 Encounter for screening for malignant neoplasm of prostate: Secondary | ICD-10-CM | POA: Diagnosis not present

## 2015-06-17 DIAGNOSIS — I129 Hypertensive chronic kidney disease with stage 1 through stage 4 chronic kidney disease, or unspecified chronic kidney disease: Secondary | ICD-10-CM | POA: Diagnosis not present

## 2015-06-17 DIAGNOSIS — F0390 Unspecified dementia without behavioral disturbance: Secondary | ICD-10-CM | POA: Diagnosis not present

## 2015-06-17 DIAGNOSIS — Z23 Encounter for immunization: Secondary | ICD-10-CM | POA: Diagnosis not present

## 2015-06-17 DIAGNOSIS — Z1389 Encounter for screening for other disorder: Secondary | ICD-10-CM | POA: Diagnosis not present

## 2015-06-17 DIAGNOSIS — Z0000 Encounter for general adult medical examination without abnormal findings: Secondary | ICD-10-CM | POA: Diagnosis not present

## 2015-06-24 DIAGNOSIS — I129 Hypertensive chronic kidney disease with stage 1 through stage 4 chronic kidney disease, or unspecified chronic kidney disease: Secondary | ICD-10-CM | POA: Diagnosis not present

## 2015-06-24 DIAGNOSIS — N400 Enlarged prostate without lower urinary tract symptoms: Secondary | ICD-10-CM | POA: Diagnosis not present

## 2015-06-24 DIAGNOSIS — N182 Chronic kidney disease, stage 2 (mild): Secondary | ICD-10-CM | POA: Diagnosis not present

## 2015-06-24 DIAGNOSIS — G309 Alzheimer's disease, unspecified: Secondary | ICD-10-CM | POA: Diagnosis not present

## 2015-07-23 ENCOUNTER — Encounter (HOSPITAL_COMMUNITY): Payer: Self-pay | Admitting: *Deleted

## 2015-07-23 ENCOUNTER — Emergency Department (HOSPITAL_COMMUNITY): Payer: Medicare Other

## 2015-07-23 ENCOUNTER — Emergency Department (HOSPITAL_COMMUNITY)
Admission: EM | Admit: 2015-07-23 | Discharge: 2015-07-23 | Disposition: A | Discharge status: Home / Self Care | Payer: Medicare Other | Attending: Emergency Medicine | Admitting: Emergency Medicine

## 2015-07-23 DIAGNOSIS — M1990 Unspecified osteoarthritis, unspecified site: Secondary | ICD-10-CM | POA: Diagnosis not present

## 2015-07-23 DIAGNOSIS — R011 Cardiac murmur, unspecified: Secondary | ICD-10-CM | POA: Insufficient documentation

## 2015-07-23 DIAGNOSIS — I10 Essential (primary) hypertension: Secondary | ICD-10-CM | POA: Insufficient documentation

## 2015-07-23 DIAGNOSIS — Z79899 Other long term (current) drug therapy: Secondary | ICD-10-CM | POA: Insufficient documentation

## 2015-07-23 DIAGNOSIS — J9811 Atelectasis: Secondary | ICD-10-CM | POA: Diagnosis not present

## 2015-07-23 DIAGNOSIS — Z8659 Personal history of other mental and behavioral disorders: Secondary | ICD-10-CM | POA: Diagnosis not present

## 2015-07-23 DIAGNOSIS — Z923 Personal history of irradiation: Secondary | ICD-10-CM | POA: Insufficient documentation

## 2015-07-23 DIAGNOSIS — N400 Enlarged prostate without lower urinary tract symptoms: Secondary | ICD-10-CM | POA: Insufficient documentation

## 2015-07-23 DIAGNOSIS — E785 Hyperlipidemia, unspecified: Secondary | ICD-10-CM | POA: Diagnosis not present

## 2015-07-23 DIAGNOSIS — Z87891 Personal history of nicotine dependence: Secondary | ICD-10-CM | POA: Insufficient documentation

## 2015-07-23 DIAGNOSIS — H9190 Unspecified hearing loss, unspecified ear: Secondary | ICD-10-CM | POA: Diagnosis not present

## 2015-07-23 DIAGNOSIS — M79642 Pain in left hand: Secondary | ICD-10-CM | POA: Diagnosis present

## 2015-07-23 DIAGNOSIS — Z8546 Personal history of malignant neoplasm of prostate: Secondary | ICD-10-CM | POA: Diagnosis not present

## 2015-07-23 DIAGNOSIS — Z8711 Personal history of peptic ulcer disease: Secondary | ICD-10-CM | POA: Insufficient documentation

## 2015-07-23 DIAGNOSIS — R4182 Altered mental status, unspecified: Secondary | ICD-10-CM | POA: Insufficient documentation

## 2015-07-23 LAB — URINALYSIS, ROUTINE W REFLEX MICROSCOPIC
BILIRUBIN URINE: NEGATIVE
Glucose, UA: NEGATIVE mg/dL
Hgb urine dipstick: NEGATIVE
KETONES UR: NEGATIVE mg/dL
LEUKOCYTES UA: NEGATIVE
NITRITE: NEGATIVE
PH: 6.5 (ref 5.0–8.0)
Protein, ur: NEGATIVE mg/dL
Specific Gravity, Urine: 1.018 (ref 1.005–1.030)

## 2015-07-23 LAB — CBC WITH DIFFERENTIAL/PLATELET
BASOS ABS: 0 10*3/uL (ref 0.0–0.1)
BASOS PCT: 0 %
EOS ABS: 0 10*3/uL (ref 0.0–0.7)
Eosinophils Relative: 1 %
HEMATOCRIT: 39.4 % (ref 39.0–52.0)
HEMOGLOBIN: 13 g/dL (ref 13.0–17.0)
Lymphocytes Relative: 34 %
Lymphs Abs: 1.8 10*3/uL (ref 0.7–4.0)
MCH: 30.5 pg (ref 26.0–34.0)
MCHC: 33 g/dL (ref 30.0–36.0)
MCV: 92.5 fL (ref 78.0–100.0)
MONO ABS: 0.5 10*3/uL (ref 0.1–1.0)
MONOS PCT: 9 %
NEUTROS ABS: 3 10*3/uL (ref 1.7–7.7)
NEUTROS PCT: 56 %
Platelets: 149 10*3/uL — ABNORMAL LOW (ref 150–400)
RBC: 4.26 MIL/uL (ref 4.22–5.81)
RDW: 14.5 % (ref 11.5–15.5)
WBC: 5.3 10*3/uL (ref 4.0–10.5)

## 2015-07-23 LAB — BASIC METABOLIC PANEL
ANION GAP: 13 (ref 5–15)
BUN: 15 mg/dL (ref 6–20)
CALCIUM: 9.4 mg/dL (ref 8.9–10.3)
CO2: 27 mmol/L (ref 22–32)
CREATININE: 1.27 mg/dL — AB (ref 0.61–1.24)
Chloride: 103 mmol/L (ref 101–111)
GFR, EST NON AFRICAN AMERICAN: 55 mL/min — AB (ref >60)
Glucose, Bld: 103 mg/dL — ABNORMAL HIGH (ref 65–99)
Potassium: 2.8 mmol/L — ABNORMAL LOW (ref 3.5–5.1)
Sodium: 143 mmol/L (ref 135–145)

## 2015-07-23 MED ORDER — POTASSIUM CHLORIDE CRYS ER 20 MEQ PO TBCR
40.0000 meq | EXTENDED_RELEASE_TABLET | Freq: Once | ORAL | Status: AC
  Administered 2015-07-23: 40 meq via ORAL
  Filled 2015-07-23: qty 2

## 2015-07-23 NOTE — ED Notes (Signed)
The pt  Is c/o lt hand he reports  That he has had the pain for 2 years.  The pts family member reports that the pt called the police earlier tonight he thought he had people breaking into the house.   She says that he has been seeing people and things that are not there.  Pleasant

## 2015-07-23 NOTE — Discharge Instructions (Signed)
Follow-up with your primary Dr. in the next week for recheck and possible referral to a neurologist.  Return to the emergency department if symptoms worsen or change.   Confusion Confusion is the inability to think with your usual speed or clarity. Confusion may come on quickly or slowly over time. How quickly the confusion comes on depends on the cause. Confusion can be due to any number of causes. CAUSES   Concussion, head injury, or head trauma.  Seizures.  Stroke.  Fever.  Brain tumor.  Age related decreased brain function (dementia).  Heightened emotional states like rage or terror.  Mental illness in which the person loses the ability to determine what is real and what is not (hallucinations).  Infections such as a urinary tract infection (UTI).  Toxic effects from alcohol, drugs, or prescription medicines.  Dehydration and an imbalance of salts in the body (electrolytes).  Lack of sleep.  Low blood sugar (diabetes).  Low levels of oxygen from conditions such as chronic lung disorders.  Drug interactions or other medicine side effects.  Nutritional deficiencies, especially niacin, thiamine, vitamin C, or vitamin B.  Sudden drop in body temperature (hypothermia).  Change in routine, such as when traveling or hospitalized. SIGNS AND SYMPTOMS  People often describe their thinking as cloudy or unclear when they are confused. Confusion can also include feeling disoriented. That means you are unaware of where or who you are. You may also not know what the date or time is. If confused, you may also have difficulty paying attention, remembering, and making decisions. Some people also act aggressively when they are confused.  DIAGNOSIS  The medical evaluation of confusion may include:  Blood and urine tests.  X-rays.  Brain and nervous system tests.  Analyzing your brain waves (electroencephalogram or EEG).  Magnetic resonance imaging (MRI) of your  head.  Computed tomography (CT) scan of your head.  Mental status tests in which your health care provider may ask many questions. Some of these questions may seem silly or strange, but they are a very important test to help diagnose and treat confusion. TREATMENT  An admission to the hospital may not be needed, but a person with confusion should not be left alone. Stay with a family member or friend until the confusion clears. Avoid alcohol, pain relievers, or sedative drugs until you have fully recovered. Do not drive until directed by your health care provider. HOME CARE INSTRUCTIONS  What family and friends can do:  To find out if someone is confused, ask the person to state his or her name, age, and the date. If the person is unsure or answers incorrectly, he or she is confused.  Always introduce yourself, no matter how well the person knows you.  Often remind the person of his or her location.  Place a calendar and clock near the confused person.  Help the person with his or her medicines. You may want to use a pill box, an alarm as a reminder, or give the person each dose as prescribed.  Talk about current events and plans for the day.  Try to keep the environment calm, quiet, and peaceful.  Make sure the person keeps follow-up visits with his or her health care provider. PREVENTION  Ways to prevent confusion:  Avoid alcohol.  Eat a balanced diet.  Get enough sleep.  Take medicine only as directed by your health care provider.  Do not become isolated. Spend time with other people and make plans for your  days.  Keep careful watch on your blood sugar levels if you are diabetic. SEEK IMMEDIATE MEDICAL CARE IF:   You develop severe headaches, repeated vomiting, seizures, blackouts, or slurred speech.  There is increasing confusion, weakness, numbness, restlessness, or personality changes.  You develop a loss of balance, have marked dizziness, feel uncoordinated, or  fall.  You have delusions, hallucinations, or develop severe anxiety.  Your family members think you need to be rechecked.   This information is not intended to replace advice given to you by your health care provider. Make sure you discuss any questions you have with your health care provider.   Document Released: 04/14/2004 Document Revised: 03/28/2014 Document Reviewed: 04/12/2013 Elsevier Interactive Patient Education [employer].

## 2015-07-23 NOTE — ED Provider Notes (Signed)
CSN: [id number]     Arrival date & time 07/23/15  0119 History  By signing my name below, I, Altamease Oiler, attest that this documentation has been prepared under the direction and in the presence of Veryl Speak, MD. Electronically Signed: Altamease Oiler, ED Scribe. 07/23/2015. 4:01 AM   Chief Complaint  Patient presents with  . Hand Pain   The history is provided by the patient and a relative. No language interpreter was used.   Ernest Turner is a 75 y.o. male who presents to the Emergency Department with his daughter complaining of intermittent, throbbing, left hand pain with onset more than 1 year ago. He states that the pain has been near constant today. Pt denies any injury to the hand.   The patient's daughter reports that he has been hearing and seeing people in his apartment that are not there for the last 2 weeks. He lives alone in a flat but has stated that he hears people talking above him.  The symptoms seem to be worse at night and he is not sleeping. The pt was recently found outside in his truck at 2 AM because he was concerned that someone was in his home. His daughter states that they have been in conversation with the patient's PCP about his memory issues but he has no history of dementia. Pt denies fever, cough, and chest pain.    Past Medical History  Diagnosis Date  . BPH (benign prostatic hyperplasia)   . Hypogonadism male   . Arthritis   . Depression   . Hypertension   . Heart murmur   . ED (erectile dysfunction)   . Full dentures   . HOH (hard of hearing)   . Wears glasses   . Hemorrhoids   . History of peptic ulcer   . History of radiation therapy to prostate 7800 cGy 40 sessions 04-11-2012 to 06-11-2012    EXTERNAL BEAM PELVIC AREA FOR PROSTATE CANCER  . Hyperlipidemia   . Memory difficulty   . Lower urinary tract symptoms (LUTS)   . History of urinary retention   . Prostate cancer [facility]) dx 12/26/11  urologist-  dr Gaynelle Arabian  oncologist-  dr  Valere Dross    GOLD SEED IMPLANT  03-07-2012--- Adenocarcinoma,gleason=3+3=6.,& 3+4=7,PSA=15.84--  s/p  external beam radiation 04-11-2012 to 06-11-2012  . Poor historian    Past Surgical History  Procedure Laterality Date  . Prostate biopsy  12/26/11    PSA=15.84,gleason=3+3=6,& 3+4=7,Volume=27.98cc,Adenocarcinoma  . Mouth surgery      gum surgery  . Multiple tooth extractions    . Colonoscopy    . Inguinal hernia repair Left 04/29/2014    Procedure: LEFT INGUINAL HERNIA REPAIR;  Surgeon: Coralie Keens, MD;  Location: [place];  Service: General;  Laterality: Left;  . Insertion of mesh Left 04/29/2014    Procedure: INSERTION OF MESH;  Surgeon: Coralie Keens, MD;  Location: [place];  Service: General;  Laterality: Left;  . Circumcision  09-21-2006  . Cystoscopy Right 10/16/2014    Procedure: CYSTO, RIGHT RETROGRADE COOK BALLOON DILATION RIGHT PROXIMAL URETHERAL STRICTURE;  Surgeon: Carolan Clines, MD;  Location: [place];  Service: Urology;  Laterality: Right;   Family History  Problem Relation Age of Onset  . Cancer Sister     liver to brain mets died late 102's  . Cancer Daughter     breast cancer died age 10  . Diabetes Mother    Social History  Substance Use Topics  . Smoking status: Former  Smoker -- 2.00 packs/day for 20 years    Types: Cigarettes    Quit date: 01/21/1984  . Smokeless tobacco: Never Used  . Alcohol Use: No    Review of Systems 10 Systems reviewed and all are negative for acute change except as noted in the HPI.   Allergies  Androgel and Viagra  Home Medications   Prior to Admission medications   Medication Sig Start Date End Date Taking? Authorizing Provider  amLODipine (NORVASC) 10 MG tablet Take 10 mg by mouth every morning.     Historical Provider, MD  Calcium-Magnesium-Vitamin D (CALCIUM MAGNESIUM PO) Take 1 tablet by mouth 2 (two) times daily.    Historical Provider, MD  finasteride (PROSCAR) 5 MG tablet Take 5 mg by mouth every  morning.     Historical Provider, MD  HYDROcodone-acetaminophen (NORCO) 5-325 MG per tablet Take 1-2 tablets by mouth every 4 (four) hours as needed. 04/29/14   Coralie Keens, MD  oxybutynin (DITROPAN XL) 10 MG 24 hr tablet Take 10 mg by mouth every morning.     Historical Provider, MD  phenazopyridine (PYRIDIUM) 200 MG tablet Take 1 tablet (200 mg total) by mouth 3 (three) times daily as needed for pain. 10/16/14   Carolan Clines, MD  tamsulosin (FLOMAX) 0.4 MG CAPS capsule Take 1 capsule (0.4 mg total) by mouth daily after breakfast. Take tamsulosin only if having difficulty voiding. 10/16/14   Carolan Clines, MD  terazosin (HYTRIN) 5 MG capsule Take 5 mg by mouth at bedtime.  11/11/11   Historical Provider, MD  trimethoprim (TRIMPEX) 100 MG tablet Take 1 tablet (100 mg total) by mouth 1 day or 1 dose. 10/16/14   Carolan Clines, MD   BP 117/89 mmHg  Pulse 57  Temp(Src) 97.9 F (36.6 C) (Oral)  Resp 16  SpO2 97% Physical Exam  Constitutional: He is oriented to person, place, and time. He appears well-developed and well-nourished.  HENT:  Head: Normocephalic and atraumatic.  Eyes: EOM are normal.  Neck: Normal range of motion.  Cardiovascular: Normal rate, regular rhythm, normal heart sounds and intact distal pulses.   Pulmonary/Chest: Effort normal and breath sounds normal. No respiratory distress.  Abdominal: Soft. He exhibits no distension. There is no tenderness.  Musculoskeletal: Normal range of motion.  The left hand appears grossly normal with no deformity or evidence of trauma.   Neurological: He is alert and oriented to person, place, and time.  Skin: Skin is warm and dry.  Psychiatric: He has a normal mood and affect. Judgment normal.  Nursing note and vitals reviewed.   ED Course  Procedures (including critical care time) DIAGNOSTIC STUDIES: Oxygen Saturation is 97% on RA,  normal by my interpretation.    COORDINATION OF CARE: 3:56 AM Discussed treatment plan  which includes lab work, CT head without contrast, and CXR with the pt and his daughter at bedside and they agreed to plan.  Labs Review Labs Reviewed  BASIC METABOLIC PANEL  CBC WITH DIFFERENTIAL/PLATELET  URINALYSIS, ROUTINE W REFLEX MICROSCOPIC (NOT AT [facility])    Imaging Review No results found. I have personally reviewed and evaluated these images and lab results as part of my medical decision-making.   EKG Interpretation None      MDM   Final diagnoses:  None    Patient brought by daughter for evaluation of confusion. He is apparently seeing people in his apartment and has what sounds like a sundowning at night. He has also had some confusion recently. His physical examination  is unremarkable and the patient appears quite well. He is awake, alert, and oriented and responds to questions and commands appropriately. His workup shows no acute abnormality including blood work, urinalysis, chest x-ray, and head CT. I see no indication for medical admission and believe the patient is appropriate for discharge. He is to follow-up with his primary Dr. to discuss his situation. I suspect there may be the early onset of some dementia  I personally performed the services described in this documentation, which was scribed in my presence. The recorded information has been reviewed and is accurate.       Veryl Speak, MD 07/23/15 [phone]

## 2015-07-27 DIAGNOSIS — Z09 Encounter for follow-up examination after completed treatment for conditions other than malignant neoplasm: Secondary | ICD-10-CM | POA: Diagnosis not present

## 2015-07-27 DIAGNOSIS — H9313 Tinnitus, bilateral: Secondary | ICD-10-CM | POA: Diagnosis not present

## 2015-07-27 DIAGNOSIS — G309 Alzheimer's disease, unspecified: Secondary | ICD-10-CM | POA: Diagnosis not present

## 2015-07-27 DIAGNOSIS — I129 Hypertensive chronic kidney disease with stage 1 through stage 4 chronic kidney disease, or unspecified chronic kidney disease: Secondary | ICD-10-CM | POA: Diagnosis not present

## 2015-07-27 DIAGNOSIS — H6120 Impacted cerumen, unspecified ear: Secondary | ICD-10-CM | POA: Diagnosis not present

## 2015-08-10 DIAGNOSIS — N138 Other obstructive and reflux uropathy: Secondary | ICD-10-CM | POA: Diagnosis not present

## 2015-08-10 DIAGNOSIS — R3915 Urgency of urination: Secondary | ICD-10-CM | POA: Diagnosis not present

## 2015-08-10 DIAGNOSIS — Z0000 Encounter for general adult medical examination without abnormal findings: Secondary | ICD-10-CM | POA: Diagnosis not present

## 2015-08-10 DIAGNOSIS — C61 Malignant neoplasm of prostate: Secondary | ICD-10-CM | POA: Diagnosis not present

## 2015-08-10 DIAGNOSIS — N401 Enlarged prostate with lower urinary tract symptoms: Secondary | ICD-10-CM | POA: Diagnosis not present

## 2015-08-11 ENCOUNTER — Telehealth: Payer: Self-pay | Admitting: *Deleted

## 2015-08-11 NOTE — Telephone Encounter (Signed)
Message For: OFFICE               Taken 23-MAY-17 at  4:42PM by [facility] ------------------------------------------------------------ Call[facility]             CID [id number]  Patient Ernest Turner     Pt's Dr UNASSIGNED   Area Code 336 Phone# [phone]    RE NEEDS CONFIRM PTS APPT TIME ON 5/24                                                                    Disp:Y/N N If Y = C/B If No Response In 7minutes ============================================================

## 2015-08-12 ENCOUNTER — Encounter: Payer: Self-pay | Admitting: Neurology

## 2015-08-12 ENCOUNTER — Ambulatory Visit (INDEPENDENT_AMBULATORY_CARE_PROVIDER_SITE_OTHER): Payer: Medicare Other | Admitting: Neurology

## 2015-08-12 VITALS — BP 130/72 | HR 68 | Ht 69.0 in | Wt 180.2 lb

## 2015-08-12 DIAGNOSIS — F0390 Unspecified dementia without behavioral disturbance: Secondary | ICD-10-CM | POA: Insufficient documentation

## 2015-08-12 MED ORDER — MEMANTINE HCL 10 MG PO TABS: 10.0000 mg | ORAL_TABLET | Freq: Two times a day (BID) | ORAL | Status: DC

## 2015-08-12 MED ORDER — DONEPEZIL HCL 10 MG PO TABS: 10.0000 mg | ORAL_TABLET | Freq: Every evening | ORAL | Status: DC

## 2015-08-12 MED ORDER — DICLOFENAC SODIUM 1 % TD GEL: 4.0000 g | Freq: Four times a day (QID) | TRANSDERMAL | Status: DC

## 2015-08-12 NOTE — Progress Notes (Signed)
PATIENT: Ernest Turner DOB: [birth year]  Chief Complaint  Patient presents with  . Memory Loss    MMSE 19/30 - 13 animals.  He is here with his daughter, [name], to discuss his worsening memory.  He was started on donepezil 5mg  daily one month ago but [name] is unsure if he has been taking it correctly.     HISTORICAL  Ernest Turner is a 75 years old left-handed male, accompanied by his daughter [name], seen in refer by his primary care doctor Dr. Thressa Sheller,  for evaluation of memory loss in Aug 12 2015,  I reviewed and summarized referring note, he had a history of hypertension, had a history of prostate cancer, had treatment in the past   He presented to the emergency room in May 4rth 2017 for increased confusion, left hand pain  CAT scan of the brain showed generalized atrophy Laboratory evaluation showed low potassium 2.8, elevated creatinine 1.27, normal UA,  He was started on donepezil 5 mg every day since Jun 22, 2015, Tolerating it well,  He had a 14 years of education, currently work as a Theme park manager, lives alone at apartment, he was noted to has difficulty with memory since his wife passed away in 21-Jun-2012, gradually getting worse, he tends to repeat multiple times before her delivery sermon he still read his Bible.  He was noted to got lost while driving, he misses meals sometimes  Daughter also reported delusional episodes, he saw people in his attic,  He has family history of dementia, father suffered dementia in his old age   We also personally reviewed CAT scan in May 2017, moderate generalized atrophy  He also complains of left second and third finger pain  REVIEW OF SYSTEMS: Full 14 system review of systems performed and notable only for as above  ALLERGIES: Allergies  Allergen Reactions  . Androgel [Testosterone] Other (See Comments)    unknown  . Viagra [Sildenafil Citrate] Other (See Comments)    unknown    HOME MEDICATIONS: Current Outpatient  Prescriptions  Medication Sig Dispense Refill  . amLODipine (NORVASC) 10 MG tablet Take 10 mg by mouth every morning.     . donepezil (ARICEPT) 5 MG tablet Take 5 mg by mouth every evening.    . finasteride (PROSCAR) 5 MG tablet Take 5 mg by mouth every morning.     Marland Kitchen oxybutynin (DITROPAN-XL) 10 MG 24 hr tablet TK 1 T PO QD  2  . phenazopyridine (PYRIDIUM) 200 MG tablet Take 1 tablet (200 mg total) by mouth 3 (three) times daily as needed for pain. 30 tablet 3  . tamsulosin (FLOMAX) 0.4 MG CAPS capsule Take 1 capsule (0.4 mg total) by mouth daily after breakfast. Take tamsulosin only if having difficulty voiding. 30 capsule 11  . terazosin (HYTRIN) 5 MG capsule TK 1 C PO QD  1   No current facility-administered medications for this visit.    PAST MEDICAL HISTORY: Past Medical History  Diagnosis Date  . BPH (benign prostatic hyperplasia)   . Hypogonadism male   . Arthritis   . Depression   . Hypertension   . Heart murmur   . ED (erectile dysfunction)   . Full dentures   . HOH (hard of hearing)   . Wears glasses   . Hemorrhoids   . History of peptic ulcer   . History of radiation therapy to prostate 7800 cGy 40 sessions 04-11-2012 to 06-11-2012    EXTERNAL BEAM PELVIC AREA FOR PROSTATE CANCER  .  Hyperlipidemia   . Memory difficulty   . Lower urinary tract symptoms (LUTS)   . History of urinary retention   . Prostate cancer [facility]) dx 12/26/11  urologist-  dr Gaynelle Arabian  oncologist-  dr Valere Dross    GOLD SEED IMPLANT  03-07-2012--- Adenocarcinoma,gleason=3+3=6.,& 3+4=7,PSA=15.84--  s/p  external beam radiation 04-11-2012 to 06-11-2012  . Poor historian   . Memory loss     PAST SURGICAL HISTORY: Past Surgical History  Procedure Laterality Date  . Prostate biopsy  12/26/11    PSA=15.84,gleason=3+3=6,& 3+4=7,Volume=27.98cc,Adenocarcinoma  . Mouth surgery      gum surgery  . Multiple tooth extractions    . Colonoscopy    . Inguinal hernia repair Left 04/29/2014    Procedure: LEFT  INGUINAL HERNIA REPAIR;  Surgeon: Coralie Keens, MD;  Location: [place];  Service: General;  Laterality: Left;  . Insertion of mesh Left 04/29/2014    Procedure: INSERTION OF MESH;  Surgeon: Coralie Keens, MD;  Location: [place];  Service: General;  Laterality: Left;  . Circumcision  09-21-2006  . Cystoscopy Right 10/16/2014    Procedure: CYSTO, RIGHT RETROGRADE COOK BALLOON DILATION RIGHT PROXIMAL URETHERAL STRICTURE;  Surgeon: Carolan Clines, MD;  Location: [place];  Service: Urology;  Laterality: Right;    FAMILY HISTORY: Family History  Problem Relation Age of Onset  . Cancer Sister     liver to brain mets died late 47's  . Cancer Daughter     breast cancer died age 59  . Diabetes Mother     SOCIAL HISTORY:  Social History   Social History  . Marital Status: Widowed    Spouse Name: N/A  . Number of Children: 3  . Years of Education: 2 yrs clg   Occupational History  . Retired    Social History Main Topics  . Smoking status: Former Smoker -- 2.00 packs/day for 20 years    Types: Cigarettes    Quit date: 01/21/1984  . Smokeless tobacco: Never Used  . Alcohol Use: No  . Drug Use: No  . Sexual Activity: Not on file   Other Topics Concern  . Not on file   Social History Narrative   Lives at home alone.   Left-handed.   Rarely drinks caffeine.     PHYSICAL EXAM   Filed Vitals:   08/12/15 1348  BP: 130/72  Pulse: 68  Height: 5\' 9"  (1.753 m)  Weight: 180 lb 4 oz (81.761 kg)    Not recorded      Body mass index is 26.61 kg/(m^2).  PHYSICAL EXAMNIATION:  Gen: NAD, conversant, well nourised, obese, well groomed                     Cardiovascular: Regular rate rhythm, no peripheral edema, warm, nontender. Eyes: Conjunctivae clear without exudates or hemorrhage Neck: Supple, no carotid bruise. Pulmonary: Clear to auscultation bilaterally  Musculoskeletal: Left second and third finger joints swelling tenderness upon deep palpitation     NEUROLOGICAL EXAM:  MENTAL STATUS: Speech:    Speech is normal; fluent and spontaneous with normal comprehension.  Cognition:Mini-Mental Status Examination 19/30, animal naming 13     Orientation: He is not oriented to date, year months days season clinic      recent and remote memory: He missed 3/3 recall     Normal Attention span and concentration     Normal Language, naming, repeating,spontaneous speech: He has difficulty copy [employer] of knowledge  CRANIAL NERVES: CN II: Visual fields are full to confrontation. Fundoscopic exam is normal with sharp discs and no vascular changes. Pupils are round equal and briskly reactive to light. CN III, IV, VI: extraocular movement are normal. No ptosis. CN V: Facial sensation is intact to pinprick in all 3 divisions bilaterally. Corneal responses are intact.  CN VII: Face is symmetric with normal eye closure and smile. CN VIII: Hearing is normal to rubbing fingers CN IX, X: Palate elevates symmetrically. Phonation is normal. CN XI: Head turning and shoulder shrug are intact CN XII: Tongue is midline with normal movements and no atrophy.  MOTOR: There is no pronator drift of out-stretched arms. Muscle bulk and tone are normal. Muscle strength is normal.  REFLEXES: Reflexes are 2+ and symmetric at the biceps, triceps, knees, and ankles. Plantar responses are flexor.  SENSORY: Intact to light touch, pinprick, positional sensation and vibratory sensation are intact in fingers and toes.  COORDINATION: Rapid alternating movements and fine finger movements are intact. There is no dysmetria on finger-to-nose and heel-knee-shin.    GAIT/STANCE: Unit push up to get up from seated position, cautious,  DIAGNOSTIC DATA (LABS, IMAGING, TESTING) - I reviewed patient records, labs, notes, testing and imaging myself where available.   ASSESSMENT AND PLAN  Ernest Turner is a 75 y.o. male    Dementia  Mini-Mental Status Examination  19/30,  Most consistent with central nervous system degenerative disorder  MRI of the brain without contrast  Laboratory evaluations  Namenda 10 mg twice a day, Aricept 10 mg daily  Left hand pain  Most consistent with musculoskeletal etiology  Voltaren gel   Marcial Pacas, M.D. Ph.D.  [facility] [address], [place], [place] [address] Ph: [phone] Fax: [phone]  CC: Thressa Sheller, MD

## 2015-08-13 ENCOUNTER — Telehealth: Payer: Self-pay

## 2015-08-13 LAB — RPR: RPR Ser Ql: NONREACTIVE

## 2015-08-13 LAB — VITAMIN D 25 HYDROXY (VIT D DEFICIENCY, FRACTURES): VIT D 25 HYDROXY: 41.4 ng/mL (ref 30.0–100.0)

## 2015-08-13 LAB — TSH: TSH: 0.82 u[IU]/mL (ref 0.450–4.500)

## 2015-08-13 LAB — VITAMIN B12: Vitamin B-12: 186 pg/mL — ABNORMAL LOW (ref 211–946)

## 2015-08-13 NOTE — Telephone Encounter (Signed)
I spoke to patient's daughter. She will advise patient. I advised her that I will send results and recommendations to Dr. Doristine Devoid office.

## 2015-08-13 NOTE — Telephone Encounter (Signed)
-----   Message from Marcial Pacas, MD sent at 08/13/2015  7:59 AM EDT ----- Please call patient for low Vit B12, he needs B12 im supplement, 1040mcg qday xone week, then q week xone month, then monthly. He may get B12 intramuscular supplement at our office or his primary care's office

## 2015-08-18 ENCOUNTER — Encounter: Payer: Self-pay | Admitting: *Deleted

## 2015-08-22 ENCOUNTER — Inpatient Hospital Stay: Admission: RE | Admit: 2015-08-22 | Payer: Self-pay | Source: Ambulatory Visit

## 2015-08-25 ENCOUNTER — Ambulatory Visit: Payer: Self-pay | Admitting: Neurology

## 2015-08-31 ENCOUNTER — Inpatient Hospital Stay: Admission: RE | Admit: 2015-08-31 | Payer: Self-pay | Source: Ambulatory Visit

## 2015-09-01 DIAGNOSIS — F339 Major depressive disorder, recurrent, unspecified: Secondary | ICD-10-CM | POA: Diagnosis not present

## 2015-09-01 DIAGNOSIS — R634 Abnormal weight loss: Secondary | ICD-10-CM | POA: Diagnosis not present

## 2015-09-08 ENCOUNTER — Ambulatory Visit: Payer: Self-pay | Admitting: Neurology

## 2015-09-08 ENCOUNTER — Telehealth: Payer: Self-pay | Admitting: *Deleted

## 2015-09-08 NOTE — Telephone Encounter (Signed)
No showed follow up appointment. 

## 2015-09-09 ENCOUNTER — Encounter: Payer: Self-pay | Admitting: Neurology

## 2015-09-10 DIAGNOSIS — M79645 Pain in left finger(s): Secondary | ICD-10-CM | POA: Diagnosis not present

## 2015-09-10 DIAGNOSIS — R627 Adult failure to thrive: Secondary | ICD-10-CM | POA: Diagnosis not present

## 2015-09-10 DIAGNOSIS — H9313 Tinnitus, bilateral: Secondary | ICD-10-CM | POA: Diagnosis not present

## 2015-09-10 DIAGNOSIS — M19042 Primary osteoarthritis, left hand: Secondary | ICD-10-CM | POA: Diagnosis not present

## 2015-09-17 DIAGNOSIS — M79642 Pain in left hand: Secondary | ICD-10-CM | POA: Diagnosis not present

## 2015-09-17 DIAGNOSIS — R627 Adult failure to thrive: Secondary | ICD-10-CM | POA: Diagnosis not present

## 2016-01-05 DIAGNOSIS — I129 Hypertensive chronic kidney disease with stage 1 through stage 4 chronic kidney disease, or unspecified chronic kidney disease: Secondary | ICD-10-CM | POA: Diagnosis not present

## 2016-01-07 DIAGNOSIS — G309 Alzheimer's disease, unspecified: Secondary | ICD-10-CM | POA: Diagnosis not present

## 2016-01-07 DIAGNOSIS — Z23 Encounter for immunization: Secondary | ICD-10-CM | POA: Diagnosis not present

## 2016-01-07 DIAGNOSIS — N182 Chronic kidney disease, stage 2 (mild): Secondary | ICD-10-CM | POA: Diagnosis not present

## 2016-01-07 DIAGNOSIS — N400 Enlarged prostate without lower urinary tract symptoms: Secondary | ICD-10-CM | POA: Diagnosis not present

## 2016-01-07 DIAGNOSIS — I10 Essential (primary) hypertension: Secondary | ICD-10-CM | POA: Diagnosis not present

## 2016-07-28 DIAGNOSIS — I129 Hypertensive chronic kidney disease with stage 1 through stage 4 chronic kidney disease, or unspecified chronic kidney disease: Secondary | ICD-10-CM | POA: Diagnosis not present

## 2016-07-28 DIAGNOSIS — Z125 Encounter for screening for malignant neoplasm of prostate: Secondary | ICD-10-CM | POA: Diagnosis not present

## 2016-07-28 DIAGNOSIS — G309 Alzheimer's disease, unspecified: Secondary | ICD-10-CM | POA: Diagnosis not present

## 2016-07-28 DIAGNOSIS — N182 Chronic kidney disease, stage 2 (mild): Secondary | ICD-10-CM | POA: Diagnosis not present

## 2016-07-28 DIAGNOSIS — I10 Essential (primary) hypertension: Secondary | ICD-10-CM | POA: Diagnosis not present

## 2016-07-28 DIAGNOSIS — N400 Enlarged prostate without lower urinary tract symptoms: Secondary | ICD-10-CM | POA: Diagnosis not present

## 2016-09-04 ENCOUNTER — Other Ambulatory Visit: Payer: Self-pay | Admitting: Neurology

## 2017-06-19 DIAGNOSIS — S90822A Blister (nonthermal), left foot, initial encounter: Secondary | ICD-10-CM | POA: Diagnosis not present

## 2017-06-19 DIAGNOSIS — H6120 Impacted cerumen, unspecified ear: Secondary | ICD-10-CM | POA: Diagnosis not present

## 2017-06-19 DIAGNOSIS — K118 Other diseases of salivary glands: Secondary | ICD-10-CM | POA: Diagnosis not present

## 2017-06-19 DIAGNOSIS — R030 Elevated blood-pressure reading, without diagnosis of hypertension: Secondary | ICD-10-CM | POA: Diagnosis not present

## 2017-06-19 DIAGNOSIS — E638 Other specified nutritional deficiencies: Secondary | ICD-10-CM | POA: Diagnosis not present

## 2017-07-24 DIAGNOSIS — Z0000 Encounter for general adult medical examination without abnormal findings: Secondary | ICD-10-CM | POA: Diagnosis not present

## 2017-10-31 DIAGNOSIS — H6120 Impacted cerumen, unspecified ear: Secondary | ICD-10-CM | POA: Diagnosis not present

## 2017-10-31 DIAGNOSIS — I10 Essential (primary) hypertension: Secondary | ICD-10-CM | POA: Diagnosis not present

## 2017-10-31 DIAGNOSIS — D110 Benign neoplasm of parotid gland: Secondary | ICD-10-CM | POA: Diagnosis not present

## 2018-04-03 DIAGNOSIS — I10 Essential (primary) hypertension: Secondary | ICD-10-CM | POA: Diagnosis not present

## 2018-04-03 DIAGNOSIS — D110 Benign neoplasm of parotid gland: Secondary | ICD-10-CM | POA: Diagnosis not present

## 2018-04-03 DIAGNOSIS — H6123 Impacted cerumen, bilateral: Secondary | ICD-10-CM | POA: Diagnosis not present

## 2018-04-03 DIAGNOSIS — G3184 Mild cognitive impairment, so stated: Secondary | ICD-10-CM | POA: Diagnosis not present

## 2018-05-28 ENCOUNTER — Emergency Department (HOSPITAL_COMMUNITY): Payer: Medicare Other

## 2018-05-28 ENCOUNTER — Emergency Department (HOSPITAL_COMMUNITY)
Admission: EM | Admit: 2018-05-28 | Discharge: 2018-05-28 | Disposition: A | Discharge status: Home / Self Care | Payer: Medicare Other | Attending: Emergency Medicine | Admitting: Emergency Medicine

## 2018-05-28 ENCOUNTER — Other Ambulatory Visit: Payer: Self-pay

## 2018-05-28 ENCOUNTER — Encounter (HOSPITAL_COMMUNITY): Payer: Self-pay | Admitting: Emergency Medicine

## 2018-05-28 DIAGNOSIS — R7989 Other specified abnormal findings of blood chemistry: Secondary | ICD-10-CM | POA: Diagnosis not present

## 2018-05-28 DIAGNOSIS — Z87891 Personal history of nicotine dependence: Secondary | ICD-10-CM | POA: Diagnosis not present

## 2018-05-28 DIAGNOSIS — R0902 Hypoxemia: Secondary | ICD-10-CM | POA: Diagnosis not present

## 2018-05-28 DIAGNOSIS — N200 Calculus of kidney: Secondary | ICD-10-CM | POA: Diagnosis not present

## 2018-05-28 DIAGNOSIS — I10 Essential (primary) hypertension: Secondary | ICD-10-CM | POA: Diagnosis not present

## 2018-05-28 DIAGNOSIS — Z8546 Personal history of malignant neoplasm of prostate: Secondary | ICD-10-CM | POA: Insufficient documentation

## 2018-05-28 DIAGNOSIS — F0390 Unspecified dementia without behavioral disturbance: Secondary | ICD-10-CM | POA: Diagnosis not present

## 2018-05-28 DIAGNOSIS — R404 Transient alteration of awareness: Secondary | ICD-10-CM | POA: Diagnosis not present

## 2018-05-28 DIAGNOSIS — R4182 Altered mental status, unspecified: Secondary | ICD-10-CM | POA: Diagnosis present

## 2018-05-28 DIAGNOSIS — M25519 Pain in unspecified shoulder: Secondary | ICD-10-CM | POA: Diagnosis not present

## 2018-05-28 DIAGNOSIS — R410 Disorientation, unspecified: Secondary | ICD-10-CM | POA: Diagnosis not present

## 2018-05-28 LAB — CBC WITH DIFFERENTIAL/PLATELET
Abs Immature Granulocytes: 0.01 10*3/uL (ref 0.00–0.07)
BASOS ABS: 0 10*3/uL (ref 0.0–0.1)
BASOS PCT: 0 %
EOS ABS: 0.1 10*3/uL (ref 0.0–0.5)
Eosinophils Relative: 1 %
HEMATOCRIT: 43.7 % (ref 39.0–52.0)
Hemoglobin: 14.1 g/dL (ref 13.0–17.0)
Immature Granulocytes: 0 %
LYMPHS ABS: 1.5 10*3/uL (ref 0.7–4.0)
Lymphocytes Relative: 33 %
MCH: 30.3 pg (ref 26.0–34.0)
MCHC: 32.3 g/dL (ref 30.0–36.0)
MCV: 93.8 fL (ref 80.0–100.0)
Monocytes Absolute: 0.5 10*3/uL (ref 0.1–1.0)
Monocytes Relative: 10 %
NRBC: 0 % (ref 0.0–0.2)
Neutro Abs: 2.6 10*3/uL (ref 1.7–7.7)
Neutrophils Relative %: 56 %
Platelets: 125 10*3/uL — ABNORMAL LOW (ref 150–400)
RBC: 4.66 MIL/uL (ref 4.22–5.81)
RDW: 14.5 % (ref 11.5–15.5)
WBC: 4.6 10*3/uL (ref 4.0–10.5)

## 2018-05-28 LAB — COMPREHENSIVE METABOLIC PANEL
ALK PHOS: 49 U/L (ref 38–126)
ALT: 37 U/L (ref 0–44)
AST: 33 U/L (ref 15–41)
Albumin: 3.8 g/dL (ref 3.5–5.0)
Anion gap: 6 (ref 5–15)
BILIRUBIN TOTAL: 1.3 mg/dL — AB (ref 0.3–1.2)
BUN: 11 mg/dL (ref 8–23)
CALCIUM: 9.3 mg/dL (ref 8.9–10.3)
CO2: 27 mmol/L (ref 22–32)
CREATININE: 1.02 mg/dL (ref 0.61–1.24)
Chloride: 107 mmol/L (ref 98–111)
Glucose, Bld: 91 mg/dL (ref 70–99)
Potassium: 3.5 mmol/L (ref 3.5–5.1)
SODIUM: 140 mmol/L (ref 135–145)
TOTAL PROTEIN: 7 g/dL (ref 6.5–8.1)

## 2018-05-28 LAB — LIPASE, BLOOD: Lipase: 76 U/L — ABNORMAL HIGH (ref 11–51)

## 2018-05-28 MED ORDER — IOPAMIDOL (ISOVUE-300) INJECTION 61%
100.0000 mL | Freq: Once | INTRAVENOUS | Status: AC | PRN
  Administered 2018-05-28: 100 mL via INTRAVENOUS

## 2018-05-28 MED ORDER — IOPAMIDOL (ISOVUE-300) INJECTION 61%
INTRAVENOUS | Status: AC
  Filled 2018-05-28: qty 100

## 2018-05-28 MED ORDER — SODIUM CHLORIDE (PF) 0.9 % IJ SOLN
INTRAMUSCULAR | Status: AC
  Filled 2018-05-28: qty 50

## 2018-05-28 NOTE — ED Notes (Signed)
Emergency Contact:  Daughter Tylon Kemmerling [phone]

## 2018-05-28 NOTE — ED Provider Notes (Signed)
[facility] DEPT Provider Note   CSN: [id number] Arrival date & time: 05/28/18  1739    History   Chief Complaint Chief Complaint  Patient presents with  . Altered Mental Status    HPI Ernest Turner is a 78 y.o. male.     Patient brought in by EMS from home.  Has a history of Alzheimer's disease.  Patient has been elected to see doctors recently and refusing to take his meds.  Family got him to come in today they were concerned about increased confusion and abnormal behavior at home starting about 8:00 this morning.  Patient had no complaints for me.  For EMS he is apparently complained of right bicep pain.  Oxygen saturations on room air were 98% and his blood sugar was 96%.  No fever.  Hypertensive with blood pressure of 162/84 but normal heart rate normal respiratory rate.     Past Medical History:  Diagnosis Date  . Arthritis   . BPH (benign prostatic hyperplasia)   . Depression   . ED (erectile dysfunction)   . Full dentures   . Heart murmur   . Hemorrhoids   . History of peptic ulcer   . History of radiation therapy to prostate 7800 cGy 40 sessions 04-11-2012 to 06-11-2012   EXTERNAL BEAM PELVIC AREA FOR PROSTATE CANCER  . History of urinary retention   . HOH (hard of hearing)   . Hyperlipidemia   . Hypertension   . Hypogonadism male   . Lower urinary tract symptoms (LUTS)   . Memory difficulty   . Memory loss   . Poor historian   . Prostate cancer [facility]) dx 12/26/11  urologist-  dr Gaynelle Arabian  oncologist-  dr Valere Dross   GOLD SEED IMPLANT  03-07-2012--- Adenocarcinoma,gleason=3+3=6.,& 3+4=7,PSA=15.84--  s/p  external beam radiation 04-11-2012 to 06-11-2012  . Wears glasses     Patient Active Problem List   Diagnosis Date Noted  . Dementia ([place]) 08/12/2015  . Nausea & vomiting 04/04/2012  . BPH (benign prostatic hyperplasia)   . Hypogonadism male   . Allergy   . Arthritis   . Depression   . GERD (gastroesophageal reflux  disease)   . Hypercholesterolemia   . Heart murmur   . Ulcer   . ED (erectile dysfunction)   . Prostate cancer ([place]) 12/26/2011    Past Surgical History:  Procedure Laterality Date  . CIRCUMCISION  09-21-2006  . COLONOSCOPY    . CYSTOSCOPY Right 10/16/2014   Procedure: CYSTO, RIGHT RETROGRADE COOK BALLOON DILATION RIGHT PROXIMAL URETHERAL STRICTURE;  Surgeon: Carolan Clines, MD;  Location: [place];  Service: Urology;  Laterality: Right;  . INGUINAL HERNIA REPAIR Left 04/29/2014   Procedure: LEFT INGUINAL HERNIA REPAIR;  Surgeon: Coralie Keens, MD;  Location: [place];  Service: General;  Laterality: Left;  . INSERTION OF MESH Left 04/29/2014   Procedure: INSERTION OF MESH;  Surgeon: Coralie Keens, MD;  Location: [place];  Service: General;  Laterality: Left;  . MOUTH SURGERY     gum surgery  . MULTIPLE TOOTH EXTRACTIONS    . PROSTATE BIOPSY  12/26/11   PSA=15.84,gleason=3+3=6,& 3+4=7,Volume=27.98cc,Adenocarcinoma        Home Medications    Prior to Admission medications   Medication Sig Start Date End Date Taking? Authorizing Provider  diclofenac sodium (VOLTAREN) 1 % GEL Apply 4 g topically 4 (four) times daily. Patient not taking: Reported on 05/28/2018 08/12/15   Marcial Pacas, MD  donepezil (ARICEPT) 10 MG tablet  Take 1 tablet (10 mg total) by mouth every evening. Patient not taking: Reported on 05/28/2018 08/12/15   Marcial Pacas, MD  memantine (NAMENDA) 10 MG tablet Take 1 tablet (10 mg total) by mouth 2 (two) times daily. Patient not taking: Reported on 05/28/2018 08/12/15   Marcial Pacas, MD  phenazopyridine (PYRIDIUM) 200 MG tablet Take 1 tablet (200 mg total) by mouth 3 (three) times daily as needed for pain. Patient not taking: Reported on 05/28/2018 10/16/14   Carolan Clines, MD  tamsulosin (FLOMAX) 0.4 MG CAPS capsule Take 1 capsule (0.4 mg total) by mouth daily after breakfast. Take tamsulosin only if having difficulty voiding. Patient not taking: Reported on  05/28/2018 10/16/14   Carolan Clines, MD    Family History Family History  Problem Relation Age of Onset  . Cancer Sister        liver to brain mets died late 64's  . Cancer Daughter        breast cancer died age 35  . Diabetes Mother     Social History Social History   Tobacco Use  . Smoking status: Former Smoker    Packs/day: 2.00    Years: 20.00    Pack years: 40.00    Types: Cigarettes    Last attempt to quit: 01/21/1984    Years since quitting: 34.3  . Smokeless tobacco: Never Used  Substance Use Topics  . Alcohol use: No  . Drug use: No     Allergies   Androgel [testosterone] and Viagra [sildenafil citrate]   Review of Systems Review of Systems  Unable to perform ROS: Dementia     Physical Exam Updated Vital Signs BP (!) 158/90   Pulse 72   Temp (!) 97.5 F (36.4 C) (Oral)   Resp 19   SpO2 96%   Physical Exam Vitals signs and nursing note reviewed.  Constitutional:      Appearance: He is well-developed.  HENT:     Head: Normocephalic and atraumatic.     Nose: No congestion.     Mouth/Throat:     Mouth: Mucous membranes are moist.  Eyes:     Extraocular Movements: Extraocular movements intact.     Conjunctiva/sclera: Conjunctivae normal.     Pupils: Pupils are equal, round, and reactive to light.  Neck:     Musculoskeletal: Normal range of motion and neck supple.  Cardiovascular:     Rate and Rhythm: Normal rate and regular rhythm.     Heart sounds: Normal heart sounds. No murmur.  Pulmonary:     Effort: Pulmonary effort is normal. No respiratory distress.     Breath sounds: Normal breath sounds.  Abdominal:     Palpations: Abdomen is soft.     Tenderness: There is no abdominal tenderness.  Musculoskeletal: Normal range of motion.        General: No swelling.  Skin:    General: Skin is warm and dry.     Capillary Refill: Capillary refill takes less than 2 seconds.  Neurological:     General: No focal deficit present.     Mental  Status: He is alert. Mental status is at baseline.     Cranial Nerves: No cranial nerve deficit.     Sensory: No sensory deficit.     Motor: No weakness.     Gait: Gait normal.      ED Treatments / Results  Labs (all labs ordered are listed, but only abnormal results are displayed) Labs Reviewed  COMPREHENSIVE METABOLIC PANEL -  Abnormal; Notable for the following components:      Result Value   Total Bilirubin 1.3 (*)    All other components within normal limits  CBC WITH DIFFERENTIAL/PLATELET - Abnormal; Notable for the following components:   Platelets 125 (*)    All other components within normal limits  LIPASE, BLOOD - Abnormal; Notable for the following components:   Lipase 76 (*)    All other components within normal limits  URINALYSIS, ROUTINE W REFLEX MICROSCOPIC    EKG EKG Interpretation  Date/Time:  Monday May 28 2018 18:28:22 EDT Ventricular Rate:  65 PR Interval:    QRS Duration: 78 QT Interval:  531 QTC Calculation: 553 R Axis:   71 Text Interpretation:  Sinus rhythm Atrial premature complex Borderline short PR interval Probable left atrial enlargement Abnormal R-wave progression, early transition Borderline repolarization abnormality Prolonged QT interval Artifact Confirmed by Fredia Sorrow [phone]) on 05/28/2018 6:40:24 PM   Radiology Dg Chest 2 View  Result Date: 05/28/2018 CLINICAL DATA:  Altered mental status. EXAM: CHEST - 2 VIEW COMPARISON:  Radiographs of Jul 23, 2015. FINDINGS: Stable cardiomediastinal silhouette. No pneumothorax is noted. Elevated left hemidiaphragm is noted. No significant pleural effusion is noted. No definite consolidative process is noted. Bony thorax is unremarkable. IMPRESSION: Elevated left hemidiaphragm is noted concerning for possible diaphoretic paralysis. No acute pulmonary disease is noted. Electronically Signed   By: Marijo Conception, M.D.   On: 05/28/2018 19:28   Ct Head Wo Contrast  Result Date: 05/28/2018 CLINICAL  DATA:  78 y/o M; history of Alzheimer's. Increased confusion and abnormal behavior. EXAM: CT HEAD WITHOUT CONTRAST TECHNIQUE: Contiguous axial images were obtained from the base of the skull through the vertex without intravenous contrast. COMPARISON:  07/23/2015 CT head. FINDINGS: Brain: No evidence of acute infarction, hemorrhage, hydrocephalus, extra-axial collection or mass lesion/mass effect. Increased volume loss of the brain with prominent anteromedial temporal lobe volume loss. Stable mild chronic microvascular ischemic changes. Vascular: Calcific atherosclerosis of carotid siphons. No hyperdense vessel identified. Skull: Normal. Negative for fracture or focal lesion. Sinuses/Orbits: No acute finding. Other: Chronic right lamina papyracea fracture with mild medialization. Debris within the external auditory canals, likely cerumen. IMPRESSION: 1. No acute intracranial abnormality identified. 2. Stable mild chronic microvascular ischemic changes. Progression of volume loss of the brain prominent in the anteromedial temporal lobes. Electronically Signed   By: Kristine Garbe M.D.   On: 05/28/2018 19:52   Ct Abdomen Pelvis W Contrast  Result Date: 05/28/2018 CLINICAL DATA:  Acute abdominal pain. History of prostate cancer, inguinal hernia repair. EXAM: CT ABDOMEN AND PELVIS WITH CONTRAST TECHNIQUE: Multidetector CT imaging of the abdomen and pelvis was performed using the standard protocol following bolus administration of intravenous contrast. CONTRAST:  171mL ISOVUE-300 IOPAMIDOL (ISOVUE-300) INJECTION 61% COMPARISON:  CT abdomen and pelvis January 08, 2012 FINDINGS: LOWER CHEST: Large LEFT diaphragmatic hernia containing spleen, stomach and colon. Included heart is displaced to the RIGHT. Fluid distended esophagus. HEPATOBILIARY: Mild intrahepatic biliary dilatation, new from prior CT. Normal gallbladder. PANCREAS: Top normal pancreatic duct. SPLEEN: Normal. ADRENALS/URINARY TRACT: Kidneys are  orthotopic, demonstrating symmetric enhancement. 3 mm RIGHT upper pole nephrolithiasis. No hydronephrosis or solid renal masses. 3.8 cm homogeneously hypodense benign-appearing cyst upper pole LEFT kidney, similar. The unopacified ureters are normal in course and caliber. Delayed imaging through the kidneys demonstrates symmetric prompt contrast excretion within the proximal urinary collecting system. Urinary bladder is partially distended and unremarkable. Normal adrenal glands. STOMACH/BOWEL: The stomach, small and large bowel are  normal in caliber without inflammatory changes. Moderate colonic diverticulosis. Normal appendix. VASCULAR/LYMPHATIC: Aortoiliac vessels are normal in course and caliber. Mild calcific atherosclerosis. No lymphadenopathy by CT size criteria. REPRODUCTIVE: Mild prostatomegaly with brachy therapy seeds. OTHER: No intraperitoneal free fluid or free air. MUSCULOSKELETAL: Nonacute. Small fat containing umbilical hernia. Small RIGHT and moderate LEFT fat and fluid containing inguinal hernias. Grade 1 L4-5 anterolisthesis without spondylolysis. Severe L4-5 neural foraminal narrowing in severe canal stenosis. IMPRESSION: 1. No acute intra-abdominal/pelvic process. 2. New mild intrahepatic biliary dilatation, normal gallbladder. Findings may be secondary to diaphragmatic hernia and habitus. Consider RIGHT upper quadrant ultrasound. 3. 3 mm nonobstructing RIGHT nephrolithiasis. 4. Grade 1 L4-5 anterolisthesis, severe L4-5 canal stenosis and severe neural foraminal narrowing. Electronically Signed   By: Elon Alas M.D.   On: 05/28/2018 22:15    Procedures Procedures (including critical care time)  Medications Ordered in ED Medications  iopamidol (ISOVUE-300) 61 % injection (has no administration in time range)  sodium chloride (PF) 0.9 % injection (has no administration in time range)  iopamidol (ISOVUE-300) 61 % injection 100 mL (100 mLs Intravenous Contrast Given 05/28/18 2147)      Initial Impression / Assessment and Plan / ED Course  I have reviewed the triage vital signs and the nursing notes.  Pertinent labs & imaging results that were available during my care of the patient were reviewed by me and considered in my medical decision making (see chart for details).       Extensive work-up here to include chest x-ray CT head and due to an elevated bilirubin and slightly elevated lipase had CT of abdomen without any acute findings.  Patient certainly in no pain no discomfort able to walk around in the department.  Does require some redirection but is pleasant.  Clearly there is a component of dementia.  Spoke with his daughter.  They are okay with him going home I have arranged for physician to visit at home and I gave her some hints on how they could get pain medicine in him without him knowing it like through putting it in his food.  Patient is definitely hypertensive here and will need some follow-up for that.  May need some additional treatment for his dementia.  Urine was normal as well.  No explanation for his increased confusion.  But patient nontoxic no acute distress.   Final Clinical Impressions(s) / ED Diagnoses   Final diagnoses:  Dementia without behavioral disturbance, unspecified dementia type [facility])    ED Discharge Orders    None       Fredia Sorrow, MD 05/28/18 2340

## 2018-05-28 NOTE — ED Notes (Signed)
Attempted x 2 to obtain an IV and blood but unsuccessful. Robert, NT is attempting to obtain blood.

## 2018-05-28 NOTE — ED Triage Notes (Signed)
Patient is from home and has a history of Alzheimer's. He presents today with increased confusion and abnormal behavior the family noted starting about 0800 today. The patient has not been taking any medication due to his unwillingness to leave the house the last year, however he was willing to leave today. He also confused family members today which is abnormal for him. EMS noted that while in conversation he had a short attention span. They also noted the patient had on 7 shirts which caused his tempeture to run high. Once most of the shirts were removed his tempeture was within normal range. Patient complains of pain in the right bicep.   EMS vitals and CBG: 98.9 temp 162/84 BP 72 HR 16 Resp Rate 98% O2 sats on room air 96 CBG

## 2018-05-28 NOTE — ED Notes (Signed)
Bed: JK09 Expected date:  Expected time:  Means of arrival:  Comments: Ems AMS

## 2018-05-28 NOTE — Discharge Instructions (Addendum)
Work-up here today without any acute medical problems.  I printed out the labs and CT results.  Understand that you are planning to have a home visit Dr. come in.  Does appear there is a component of dementia.  Would recommend follow-up for that.  Also blood pressure is elevated this is most likely because he will not take his blood pressure medications.  This can be followed up by the visiting physician.

## 2018-05-29 DIAGNOSIS — I10 Essential (primary) hypertension: Secondary | ICD-10-CM | POA: Diagnosis not present

## 2018-05-29 DIAGNOSIS — G3184 Mild cognitive impairment, so stated: Secondary | ICD-10-CM | POA: Diagnosis not present

## 2018-10-01 DIAGNOSIS — I10 Essential (primary) hypertension: Secondary | ICD-10-CM | POA: Diagnosis not present

## 2018-10-01 DIAGNOSIS — G301 Alzheimer's disease with late onset: Secondary | ICD-10-CM | POA: Diagnosis not present

## 2018-10-12 ENCOUNTER — Telehealth: Payer: Self-pay | Admitting: Adult Health Nurse Practitioner

## 2018-10-12 NOTE — Telephone Encounter (Signed)
Spoke with Coy Saunas (daugh) and discussed Palliative services with her and she was in agreement with scheduling the Palliative Consult.  Telephone consult was scheduled for 10/17/27 @ 3:30 PM.

## 2018-10-17 ENCOUNTER — Other Ambulatory Visit: Payer: Self-pay | Admitting: Adult Health Nurse Practitioner

## 2018-11-07 ENCOUNTER — Other Ambulatory Visit: Payer: Self-pay

## 2018-11-07 ENCOUNTER — Other Ambulatory Visit: Payer: Medicare Other | Admitting: Adult Health Nurse Practitioner

## 2018-11-13 ENCOUNTER — Other Ambulatory Visit: Payer: Medicare Other | Admitting: Adult Health Nurse Practitioner

## 2018-11-13 ENCOUNTER — Other Ambulatory Visit: Payer: Self-pay

## 2018-11-13 DIAGNOSIS — Z515 Encounter for palliative care: Secondary | ICD-10-CM | POA: Diagnosis not present

## 2018-11-13 NOTE — Progress Notes (Signed)
[facility] Consult Note Telephone: [phone]  Fax: [phone]  PATIENT NAME: Ernest Turner DOB: May 22, 1940 MRN: [id number]  PRIMARY CARE PROVIDER:   Thressa Sheller, MD (Inactive)  REFERRING PROVIDER:  No referring provider defined for this encounter.  RESPONSIBLE PARTY:     ASSESSMENT:        RECOMMENDATIONS and PLAN:  1.  Advanced care planning.  Patient is a full code.  Left MOST and information on MOST with daughter.  Have appointment scheduled in 2 weeks to go over and fill out MOST  2.  Alzheimer's dementia. FAST 6.  Patient does live alone in an apartment.  His daughter visits often.  She did have to have the gas turned off to the stove for his safety.  He is able to take care of ADLs but not IADLs. Though does need some assistance with bathing. Walks without assistive devices.  Is continent of B&B. No changes in appetite or weight. He is A&O to person and place but sometimes will forget that he is at home when he sundowns.  Daughter states that he was found wandering in the evening twice in the past year.  Do have concerns for his safety and talked with daughter about possibility of family moving in with him or moving him in with family.  States that she really did not want to move him out of the home he is familiar with.  Talked about getting caregivers in the home especially in the evenings and at night when he sundowns and wanders.  States that she will look into finding someone.  Also contacted our SW to reach out to her with resources.  I spent 45 minutes providing this consultation,  from 12:30 to 1:15. More than 50% of the time in this consultation was spent coordinating communication.   HISTORY OF PRESENT ILLNESS:  Ernest Turner is a 78 y.o. year old male with multiple medical problems including Alzheimer's dementia, HTN h/o prostate cancer. Palliative Care was asked to help address goals of care.   CODE STATUS:  full code  PPS: 60% HOSPICE ELIGIBILITY/DIAGNOSIS: TBD  PHYSICAL EXAM:  BP 120/78  HR 72  O2 sat 97% on RA General: patient in NAD sitting in chair at home Cardiovascular: regular rate and rhythm Pulmonary: lung sounds clear with normal respiratory effort Abdomen: soft, nontender, + bowel sounds GU: no suprapubic tenderness Extremities: no edema, no joint deformities Skin: no rashes Neurological: A&O to person and place though at times he does forget he is at home  PAST MEDICAL HISTORY:  Past Medical History:  Diagnosis Date  . Arthritis   . BPH (benign prostatic hyperplasia)   . Depression   . ED (erectile dysfunction)   . Full dentures   . Heart murmur   . Hemorrhoids   . History of peptic ulcer   . History of radiation therapy to prostate 7800 cGy 40 sessions 04-11-2012 to 06-11-2012   EXTERNAL BEAM PELVIC AREA FOR PROSTATE CANCER  . History of urinary retention   . HOH (hard of hearing)   . Hyperlipidemia   . Hypertension   . Hypogonadism male   . Lower urinary tract symptoms (LUTS)   . Memory difficulty   . Memory loss   . Poor historian   . Prostate cancer [facility]) dx 12/26/11  urologist-  dr Gaynelle Arabian  oncologist-  dr Valere Dross   GOLD SEED IMPLANT  03-07-2012--- Adenocarcinoma,gleason=3+3=6.,& 3+4=7,PSA=15.84--  s/p  external beam radiation 04-11-2012 to  06-11-2012  . Wears glasses     SOCIAL HX:  Social History   Tobacco Use  . Smoking status: Former Smoker    Packs/day: 2.00    Years: 20.00    Pack years: 40.00    Types: Cigarettes    Quit date: 01/21/1984    Years since quitting: 34.8  . Smokeless tobacco: Never Used  Substance Use Topics  . Alcohol use: No    ALLERGIES:  Allergies  Allergen Reactions  . Androgel [Testosterone] Other (See Comments)    unknown  . Viagra [Sildenafil Citrate] Other (See Comments)    unknown     PERTINENT MEDICATIONS:  Outpatient Encounter Medications as of 11/13/2018  Medication Sig  . diclofenac sodium (VOLTAREN)  1 % GEL Apply 4 g topically 4 (four) times daily. (Patient not taking: Reported on 05/28/2018)  . donepezil (ARICEPT) 10 MG tablet Take 1 tablet (10 mg total) by mouth every evening. (Patient not taking: Reported on 05/28/2018)  . memantine (NAMENDA) 10 MG tablet Take 1 tablet (10 mg total) by mouth 2 (two) times daily. (Patient not taking: Reported on 05/28/2018)  . phenazopyridine (PYRIDIUM) 200 MG tablet Take 1 tablet (200 mg total) by mouth 3 (three) times daily as needed for pain. (Patient not taking: Reported on 05/28/2018)  . tamsulosin (FLOMAX) 0.4 MG CAPS capsule Take 1 capsule (0.4 mg total) by mouth daily after breakfast. Take tamsulosin only if having difficulty voiding. (Patient not taking: Reported on 05/28/2018)   No facility-administered encounter medications on file as of 11/13/2018.       Marthena Whitmyer Jenetta Downer, NP

## 2018-11-27 DIAGNOSIS — I10 Essential (primary) hypertension: Secondary | ICD-10-CM | POA: Diagnosis not present

## 2018-11-27 DIAGNOSIS — G301 Alzheimer's disease with late onset: Secondary | ICD-10-CM | POA: Diagnosis not present

## 2018-11-27 DIAGNOSIS — Z0289 Encounter for other administrative examinations: Secondary | ICD-10-CM | POA: Diagnosis not present

## 2018-11-29 ENCOUNTER — Telehealth: Payer: Self-pay | Admitting: Adult Health Nurse Practitioner

## 2018-11-29 NOTE — Telephone Encounter (Signed)
Called daughter about missed visit.  She said she was in the parking lot but no one answered the door when we knocked.  Tried calling number in Epic with no success.  She called and left message with new number.  Rescheduled visit for tomorrow at noon. Lavinia Mcneely K. Olena Heckle NP

## 2018-11-30 ENCOUNTER — Other Ambulatory Visit: Payer: Medicare Other | Admitting: Hospice

## 2018-11-30 ENCOUNTER — Other Ambulatory Visit: Payer: Self-pay

## 2018-11-30 ENCOUNTER — Other Ambulatory Visit: Payer: Medicare Other | Admitting: Adult Health Nurse Practitioner

## 2018-11-30 DIAGNOSIS — Z515 Encounter for palliative care: Secondary | ICD-10-CM | POA: Diagnosis not present

## 2018-11-30 NOTE — Progress Notes (Signed)
[place] Consult Note Telephone: [phone]  Fax: [phone]  PATIENT NAME: Ernest Turner DOB: 01-17-1941 MRN: [id number]  PRIMARY CARE PROVIDER:   Dr Reymundo Poll [facility]  REFERRING PROVIDER: Dr Reymundo Poll [facility] RESPONSIBLE PARTY:  Lei Wickizer [id number]      RECOMMENDATIONS and PLAN:  1. Advanced care planning.  Patient is a DNR. MOST form signed today after discussions and answers to questions raised by daughter. Full scope of treatment at the hospital, antibiotics IV fluid as indicated, Feeding if needed. She said she is looking into resources to get help in the home, Podiatrist and Dr Fredderick Phenix will be visiting within the next week. 2. 2.  Alzheimer's dementia. FAST 6a.  Patient does live alone in an apartment.  His daughter visits often.  She did have to have the gas turned off to the stove for his safety.  He is able to take care of ADLs but not IADLs. He is however unable to bath properly. Daughter states that he was found wandering in the evening twice in the past year and she does have concerns for his safety. She is arranging to bring in help at home. Patient walks without assistive devices.  Is continent of B&B. No changes in appetite or weight. He is A&O to person and place but sometimes will forget that he is at home when he sundowns. and talked with daughter about possibility of family moving in with him or moving him in with family. States that she really did not want to move him out of the home he is familiar with. Do continue to encourage caregiver in the home.  I spent 50 minutes providing this consultation,  from 1.00pm to 1.50pm. More than 50% of the time in this consultation was spent coordinating communication.  HISTORY OF PRESENT ILLNESS: SHEQUILLE WASDIN is a 78 y.o. year old male with multiple medical problems including Alzheimer's dementia, HTN h/o prostate cancer. Palliative Care was asked to help address goals of  care.   CODE STATUS: DNR  PPS: 60% HOSPICE ELIGIBILITY/DIAGNOSIS: TBD PHYSICAL EXAM:   General: Patient in no acute distress Extremities: no edema, no joint deformities Skin: no rashes in exposed skin. Neurological: Weakness, alert and oriented to person and place, pleasantly confused.   PAST MEDICAL HISTORY:  Past Medical History:  Diagnosis Date  . Arthritis   . BPH (benign prostatic hyperplasia)   . Depression   . ED (erectile dysfunction)   . Full dentures   . Heart murmur   . Hemorrhoids   . History of peptic ulcer   . History of radiation therapy to prostate 7800 cGy 40 sessions 04-11-2012 to 06-11-2012   EXTERNAL BEAM PELVIC AREA FOR PROSTATE CANCER  . History of urinary retention   . HOH (hard of hearing)   . Hyperlipidemia   . Hypertension   . Hypogonadism male   . Lower urinary tract symptoms (LUTS)   . Memory difficulty   . Memory loss   . Poor historian   . Prostate cancer [facility]) dx 12/26/11  urologist-  dr Gaynelle Arabian  oncologist-  dr Valere Dross   GOLD SEED IMPLANT  03-07-2012--- Adenocarcinoma,gleason=3+3=6.,& 3+4=7,PSA=15.84--  s/p  external beam radiation 04-11-2012 to 06-11-2012  . Wears glasses     SOCIAL HX:  Social History   Tobacco Use  . Smoking status: Former Smoker    Packs/day: 2.00    Years: 20.00    Pack years: 40.00    Types: Cigarettes  Quit date: 01/21/1984    Years since quitting: 34.8  . Smokeless tobacco: Never Used  Substance Use Topics  . Alcohol use: No    ALLERGIES:  Allergies  Allergen Reactions  . Androgel [Testosterone] Other (See Comments)    unknown  . Viagra [Sildenafil Citrate] Other (See Comments)    unknown     PERTINENT MEDICATIONS:  Outpatient Encounter Medications as of 11/30/2018  Medication Sig  . diclofenac sodium (VOLTAREN) 1 % GEL Apply 4 g topically 4 (four) times daily. (Patient not taking: Reported on 05/28/2018)  . donepezil (ARICEPT) 10 MG tablet Take 1 tablet (10 mg total) by mouth every evening.  (Patient not taking: Reported on 05/28/2018)  . memantine (NAMENDA) 10 MG tablet Take 1 tablet (10 mg total) by mouth 2 (two) times daily. (Patient not taking: Reported on 05/28/2018)  . phenazopyridine (PYRIDIUM) 200 MG tablet Take 1 tablet (200 mg total) by mouth 3 (three) times daily as needed for pain. (Patient not taking: Reported on 05/28/2018)  . tamsulosin (FLOMAX) 0.4 MG CAPS capsule Take 1 capsule (0.4 mg total) by mouth daily after breakfast. Take tamsulosin only if having difficulty voiding. (Patient not taking: Reported on 05/28/2018)   No facility-administered encounter medications on file as of 11/30/2018.     Teodoro Spray, NP

## 2018-12-01 DIAGNOSIS — I739 Peripheral vascular disease, unspecified: Secondary | ICD-10-CM | POA: Diagnosis not present

## 2018-12-01 DIAGNOSIS — B351 Tinea unguium: Secondary | ICD-10-CM | POA: Diagnosis not present

## 2019-01-29 DIAGNOSIS — R600 Localized edema: Secondary | ICD-10-CM | POA: Diagnosis not present

## 2019-01-29 DIAGNOSIS — I10 Essential (primary) hypertension: Secondary | ICD-10-CM | POA: Diagnosis not present

## 2019-01-29 DIAGNOSIS — Z23 Encounter for immunization: Secondary | ICD-10-CM | POA: Diagnosis not present

## 2019-01-29 DIAGNOSIS — D110 Benign neoplasm of parotid gland: Secondary | ICD-10-CM | POA: Diagnosis not present

## 2019-01-29 DIAGNOSIS — G301 Alzheimer's disease with late onset: Secondary | ICD-10-CM | POA: Diagnosis not present

## 2019-01-31 ENCOUNTER — Other Ambulatory Visit: Payer: Self-pay

## 2019-01-31 ENCOUNTER — Other Ambulatory Visit: Payer: Medicare Other | Admitting: Hospice

## 2019-01-31 DIAGNOSIS — Z515 Encounter for palliative care: Secondary | ICD-10-CM | POA: Diagnosis not present

## 2019-01-31 DIAGNOSIS — F0280 Dementia in other diseases classified elsewhere without behavioral disturbance: Secondary | ICD-10-CM

## 2019-01-31 NOTE — Progress Notes (Signed)
[place] Consult Note Telephone: [phone]  Fax: [phone]  PATIENT NAME: Ernest Turner DOB: 08-May-1940 MRN: [id number]  PRIMARY CARE PROVIDER:   Dr Reymundo Poll [facility]  REFERRING PROVIDER: Dr Reymundo Poll [facility] RESPONSIBLE PARTY:  [name] [id number] PARTY:     RECOMMENDATIONS and PLAN:  1. Advanced care planning. Patient remains a DNR. MOST form and DNR uploaded to Epic.  2. 2. Alzheimer's dementia. FAST 6a at baseline. Continues to live alone in an apartment. His daughter visits often. For patient's safety, the gas is  turned off to the stove. Daughter takes food to house everyday and helps him with ADLs as needed. She He is able to take care of ADLs but not IADLs. Patient walks without assistive devices. Is continent of B&B. [name] reported [facility] visited patient couple of days ago and wrote prescription - Triamterene for his left ankle edema. She is to go pick it up today. [name] said she is making arrangements to provide more caregiver help at home. [name] mentioned patients eats chips a lot. Education provided on saltvs elevated BP and edema. She agreed to cut back on his chips supply and augment with seasonal  fruits and vegetable.  I spent 25 minutes providing this consultation,  from 10.15a to 10.40am.  More than 50% of the time in this consultation was spent coordinating communication.  HISTORY OF PRESENT ILLNESS: Ernest Matzke Harrisonis a 66 year oldmalewith multiple medical problems including Alzheimer's dementia, HTN h/o prostate cancer. Palliative Care was asked to help address goals of care  CODE STATUS: DNR  PPS: 50% HOSPICE ELIGIBILITY/DIAGNOSIS: TBD  PAST MEDICAL HISTORY:  Past Medical History:  Diagnosis Date  . Arthritis   . BPH (benign prostatic hyperplasia)   . Depression   . ED (erectile dysfunction)   . Full dentures   . Heart murmur   . Hemorrhoids   . History of peptic ulcer   . History  of radiation therapy to prostate 7800 cGy 40 sessions 04-11-2012 to 06-11-2012   EXTERNAL BEAM PELVIC AREA FOR PROSTATE CANCER  . History of urinary retention   . HOH (hard of hearing)   . Hyperlipidemia   . Hypertension   . Hypogonadism male   . Lower urinary tract symptoms (LUTS)   . Memory difficulty   . Memory loss   . Poor historian   . Prostate cancer [facility]) dx 12/26/11  urologist-  dr Gaynelle Arabian  oncologist-  dr Valere Dross   GOLD SEED IMPLANT  03-07-2012--- Adenocarcinoma,gleason=3+3=6.,& 3+4=7,PSA=15.84--  s/p  external beam radiation 04-11-2012 to 06-11-2012  . Wears glasses     SOCIAL HX:  Social History   Tobacco Use  . Smoking status: Former Smoker    Packs/day: 2.00    Years: 20.00    Pack years: 40.00    Types: Cigarettes    Quit date: 01/21/1984    Years since quitting: 35.0  . Smokeless tobacco: Never Used  Substance Use Topics  . Alcohol use: No    ALLERGIES:  Allergies  Allergen Reactions  . Androgel [Testosterone] Other (See Comments)    unknown  . Viagra [Sildenafil Citrate] Other (See Comments)    unknown     PERTINENT MEDICATIONS:  Outpatient Encounter Medications as of 01/31/2019  Medication Sig  . diclofenac sodium (VOLTAREN) 1 % GEL Apply 4 g topically 4 (four) times daily. (Patient not taking: Reported on 05/28/2018)  . donepezil (ARICEPT) 10 MG tablet Take 1 tablet (10 mg total) by  mouth every evening. (Patient not taking: Reported on 05/28/2018)  . memantine (NAMENDA) 10 MG tablet Take 1 tablet (10 mg total) by mouth 2 (two) times daily. (Patient not taking: Reported on 05/28/2018)  . phenazopyridine (PYRIDIUM) 200 MG tablet Take 1 tablet (200 mg total) by mouth 3 (three) times daily as needed for pain. (Patient not taking: Reported on 05/28/2018)  . tamsulosin (FLOMAX) 0.4 MG CAPS capsule Take 1 capsule (0.4 mg total) by mouth daily after breakfast. Take tamsulosin only if having difficulty voiding. (Patient not taking: Reported on 05/28/2018)   No  facility-administered encounter medications on file as of 01/31/2019.     PHYSICAL EXAM:   General: Pleasant in no acute distress. Cardiovascular: denied chest pain/discomfort Pulmonary: no cough, no shortness of breath, normal respiratory effort Extremities: no edema, no joint deformities Skin: no rashes Neurological: Weakness but otherwise nonfocal, confused  Teodoro Spray, NP

## 2019-02-25 ENCOUNTER — Other Ambulatory Visit: Payer: Medicare Other | Admitting: Hospice

## 2019-02-25 ENCOUNTER — Other Ambulatory Visit: Payer: Self-pay

## 2019-02-25 DIAGNOSIS — Z515 Encounter for palliative care: Secondary | ICD-10-CM

## 2019-02-25 DIAGNOSIS — G309 Alzheimer's disease, unspecified: Secondary | ICD-10-CM

## 2019-02-25 DIAGNOSIS — F0280 Dementia in other diseases classified elsewhere without behavioral disturbance: Secondary | ICD-10-CM

## 2019-02-25 NOTE — Progress Notes (Addendum)
[place] Consult Note Telephone: [phone]  Fax: [phone]  PATIENT NAME: Ernest Turner DOB: Oct 05, 1940 MRN: [id number] PRIMARY CARE PROVIDER:Dr Reymundo Poll [facility]  REFERRING PROVIDER:Dr Reymundo Poll [facility] RESPONSIBLE PARTY:Ernest Turner [id number]  TELEHEALTH VISIT STATEMENT Due to the COVID-19 crisis, this visit was done via telephone from my office. It was initiated and consented to by this patient and/or family.  RECOMMENDATIONS/PLAN:   Advance Care Planning/Goals of Care:  Patient remains a DNR. Goals of care include to maximize quality of life and symptom management. Ernest Turner called to report she is so stressed out trying to figure out how to get help to watch patient at home and assist in his ADLs. She said she applied to Medicaid but application is no longer seen; that she needs help finding resources to help patient. Caregiver strain noted. Therapeutic listening and emotional support provided. Referral to [place]. Symptom management: Edema to left ankle improving since started on Triamterene. Ernest Turner wanted to know if it can be crushed and given to patient in apple sauce or ice cream because sometimes he refuses to take his medications. Advised to mixe the crushed med in a little portion of the apple sauce to make sure patient gets the prescribed dose. She verbalized understanding. Patient with no complaint of pain or discomfort. Dementia Fast 6a. Patient wanders and recently missed until he was found, per daughter. She said she has added a secured glass door that will prevent wandering outside the home. She and her sisters plan to be home with patient until care is arranged. Follow up: Palliative care will continue to follow patient for goals of care clarification and symptom management. I spent 30 minutes providing this consultation, from 11.30am to 12pm.  More than 50% of the time in this consultation was spent on  coordinating communication  HISTORY OF PRESENT ILLNESS:Ernest A Harrisonis a 13 year oldmalewith multiple medical problems including Alzheimer's dementia, HTN h/o prostate cancer. Palliative Care was asked to help address goals of care  CODE STATUS: DNR  PPS: 50% HOSPICE ELIGIBILITY/DIAGNOSIS: TBD  PAST MEDICAL HISTORY:  Past Medical History:  Diagnosis Date  . Arthritis   . BPH (benign prostatic hyperplasia)   . Depression   . ED (erectile dysfunction)   . Full dentures   . Heart murmur   . Hemorrhoids   . History of peptic ulcer   . History of radiation therapy to prostate 7800 cGy 40 sessions 04-11-2012 to 06-11-2012   EXTERNAL BEAM PELVIC AREA FOR PROSTATE CANCER  . History of urinary retention   . HOH (hard of hearing)   . Hyperlipidemia   . Hypertension   . Hypogonadism male   . Lower urinary tract symptoms (LUTS)   . Memory difficulty   . Memory loss   . Poor historian   . Prostate cancer [facility]) dx 12/26/11  urologist-  dr Gaynelle Arabian  oncologist-  dr Valere Dross   GOLD SEED IMPLANT  03-07-2012--- Adenocarcinoma,gleason=3+3=6.,& 3+4=7,PSA=15.84--  s/p  external beam radiation 04-11-2012 to 06-11-2012  . Wears glasses     SOCIAL HX:  Social History   Tobacco Use  . Smoking status: Former Smoker    Packs/day: 2.00    Years: 20.00    Pack years: 40.00    Types: Cigarettes    Quit date: 01/21/1984    Years since quitting: 35.1  . Smokeless tobacco: Never Used  Substance Use Topics  . Alcohol use: No    ALLERGIES:  Allergies  Allergen Reactions  . Androgel [Testosterone] Other (See Comments)    unknown  . Viagra [Sildenafil Citrate] Other (See Comments)    unknown     PERTINENT MEDICATIONS:  Outpatient Encounter Medications as of 02/25/2019  Medication Sig  . diclofenac sodium (VOLTAREN) 1 % GEL Apply 4 g topically 4 (four) times daily. (Patient not taking: Reported on 05/28/2018)  . donepezil (ARICEPT) 10 MG tablet Take 1 tablet (10 mg total) by mouth  every evening. (Patient not taking: Reported on 05/28/2018)  . memantine (NAMENDA) 10 MG tablet Take 1 tablet (10 mg total) by mouth 2 (two) times daily. (Patient not taking: Reported on 05/28/2018)  . phenazopyridine (PYRIDIUM) 200 MG tablet Take 1 tablet (200 mg total) by mouth 3 (three) times daily as needed for pain. (Patient not taking: Reported on 05/28/2018)  . tamsulosin (FLOMAX) 0.4 MG CAPS capsule Take 1 capsule (0.4 mg total) by mouth daily after breakfast. Take tamsulosin only if having difficulty voiding. (Patient not taking: Reported on 05/28/2018)   No facility-administered encounter medications on file as of 02/25/2019.    Teodoro Spray, NP

## 2019-02-26 ENCOUNTER — Other Ambulatory Visit: Payer: Medicare Other | Admitting: Licensed Clinical Social Worker

## 2019-02-26 ENCOUNTER — Other Ambulatory Visit: Payer: Self-pay

## 2019-02-26 DIAGNOSIS — Z515 Encounter for palliative care: Secondary | ICD-10-CM

## 2019-02-27 NOTE — Progress Notes (Signed)
COMMUNITY PALLIATIVE CARE SW NOTE  PATIENT NAME: RYON FAY DOB: December 10, 1940 MRN: [id number]  PRIMARY CARE PROVIDER: Thressa Sheller, MD (Inactive)  RESPONSIBLE PARTY:  Acct ID - Guarantor Home Phone Work Phone Relationship Acct Type  [id number] JAKUB, [id number]  Self P/F     [address], [place] [address]     PLAN OF CARE and INTERVENTIONS:             1. GOALS OF CARE/ ADVANCE CARE PLANNING:  Goal of daughter, [name], is to get patient admitted into an AL with a memory care unit.  Patient has a MOST form and DNR. 2. SOCIAL/EMOTIONAL/SPIRITUAL ASSESSMENT/ INTERVENTIONS:  SW conducted a Sales executive visit with patient's daughter, [name], per the request of NP [facility].  Social Services contacted her today and informed her that they were processing patient's Medicaid application.  [name] has been working with Dr. Fredderick Phenix to obtain an FL2 to seek placement in an AL.  She has also been working with her sister and brother to determine needs and interventions for patient.  [name] has spoken with [facility] and there is a bed available.  SW informed her of other ALs in [place] with memory care units that accept Medicaid.  SW provided active listening and supportive counseling.  Gave her encouragement for all of the steps she has taken to secure her father's safety and wellbeing.  3. PATIENT/CAREGIVER EDUCATION/ COPING:  SW provided education regarding placement process into an AL.  [name] copes by problem-solving. 4. PERSONAL EMERGENCY PLAN:  Family will contact EMS. 5. COMMUNITY RESOURCES COORDINATION/ HEALTH CARE NAVIGATION:  None. 6. FINANCIAL/LEGAL CONCERNS/INTERVENTIONS:  [name] expressed financial concerns.  She has applied for Medicaid for patient.     SOCIAL HX:  Social History   Tobacco Use  . Smoking status: Former Smoker    Packs/day: 2.00    Years: 20.00    Pack years: 40.00    Types: Cigarettes    Quit date: 01/21/1984    Years since  quitting: 35.1  . Smokeless tobacco: Never Used  Substance Use Topics  . Alcohol use: No    CODE STATUS:  DNR  ADVANCED DIRECTIVES: No MOST FORM COMPLETE:  Yes HOSPICE EDUCATION PROVIDED:  No Duration of visit and documentation:  30 minutes.      Creola Corn Brayten Komar, LCSW

## 2019-03-04 DIAGNOSIS — Z0289 Encounter for other administrative examinations: Secondary | ICD-10-CM | POA: Diagnosis not present

## 2019-03-04 DIAGNOSIS — G301 Alzheimer's disease with late onset: Secondary | ICD-10-CM | POA: Diagnosis not present

## 2019-03-04 DIAGNOSIS — I10 Essential (primary) hypertension: Secondary | ICD-10-CM | POA: Diagnosis not present

## 2019-03-14 ENCOUNTER — Other Ambulatory Visit: Payer: Self-pay

## 2019-03-14 ENCOUNTER — Other Ambulatory Visit: Payer: Medicare Other | Admitting: Hospice

## 2019-03-14 DIAGNOSIS — Z515 Encounter for palliative care: Secondary | ICD-10-CM

## 2019-03-14 DIAGNOSIS — F0280 Dementia in other diseases classified elsewhere without behavioral disturbance: Secondary | ICD-10-CM

## 2019-03-14 NOTE — Progress Notes (Signed)
[place] Consult Note Telephone: [phone]  Fax: [phone]  PATIENT NAME: Ernest Turner DOB: 1940/12/19 MRN: [id number]  PRIMARY CARE PROVIDER:Dr Reymundo Poll [facility]  REFERRING PROVIDER:Dr Reymundo Poll [facility] RESPONSIBLE PARTY:Wendy Martinez [id number]  TELEHEALTH VISIT STATEMENT Due to the COVID-19 crisis, this visit was done via telephone from my office. It was initiated and consented to by this patient and/or family.  RECOMMENDATIONS/PLAN:   Advance Care Planning/Goals of Care: Telehealth visit consist of building trust and follow up on patient's health status and plans for patient's placement in LTC. Briefing from [employer] and discussions today with [name] shows that [facility] had contacted [name] and is processing patient's medicaid application; patient has until Jan 17 '2021 to get his Medicaid and his LTC placement. [name] affirmed SW gave her options of available Memory Care facilities; Dr Fredderick Phenix working on [employer] form. [name] expressed satisfaction with support and guidance she is receiving and optimistic that patient will soon get placement. Meanwhile she continues to make arrangements to ensure patient's safety and wellbeing at home.  Patient remains a DNR. Goals of care include to maximize quality of life and symptom management.  Symptom management: Edema to left ankle continues to improve  since started on Triamterene. NP reiterated Dr Irwin Brakeman suggestion to get weighing scale to help monitor patient's weight/fluid status. [name] said it is in her priority to do list  Ongoing memory loss r/t Dementia, Fast 6a. Encouraged ongoing care. Patient's appetite fair to poor. Encouraged Peanut butter crackers which he likes; encouraged finger foods and small portions offered 5-6 times a day. Follow up: Palliative care will continue to follow patient for goals of care clarification and symptom management. I spent 30 minutes  providing this consultation, from 11.30am to 12pm.  More than 50% of the time in this consultation was spent on coordinating communication  HISTORY OF PRESENT ILLNESS:Ernest A Harrisonis a 52 year oldmalewith multiple medical problems including Alzheimer's dementia, HTN h/o prostate cancer. Palliative Care was asked to help address goals of care    CODE STATUS: DNR  PPS: 50% HOSPICE ELIGIBILITY/DIAGNOSIS: TBD  PAST MEDICAL HISTORY:  Past Medical History:  Diagnosis Date  . Arthritis   . BPH (benign prostatic hyperplasia)   . Depression   . ED (erectile dysfunction)   . Full dentures   . Heart murmur   . Hemorrhoids   . History of peptic ulcer   . History of radiation therapy to prostate 7800 cGy 40 sessions 04-11-2012 to 06-11-2012   EXTERNAL BEAM PELVIC AREA FOR PROSTATE CANCER  . History of urinary retention   . HOH (hard of hearing)   . Hyperlipidemia   . Hypertension   . Hypogonadism male   . Lower urinary tract symptoms (LUTS)   . Memory difficulty   . Memory loss   . Poor historian   . Prostate cancer [facility]) dx 12/26/11  urologist-  dr Gaynelle Arabian  oncologist-  dr Valere Dross   GOLD SEED IMPLANT  03-07-2012--- Adenocarcinoma,gleason=3+3=6.,& 3+4=7,PSA=15.84--  s/p  external beam radiation 04-11-2012 to 06-11-2012  . Wears glasses     SOCIAL HX:  Social History   Tobacco Use  . Smoking status: Former Smoker    Packs/day: 2.00    Years: 20.00    Pack years: 40.00    Types: Cigarettes    Quit date: 01/21/1984    Years since quitting: 35.1  . Smokeless tobacco: Never Used  Substance Use Topics  . Alcohol use: No  ALLERGIES:  Allergies  Allergen Reactions  . Androgel [Testosterone] Other (See Comments)    unknown  . Viagra [Sildenafil Citrate] Other (See Comments)    unknown     PERTINENT MEDICATIONS:  Outpatient Encounter Medications as of 03/14/2019  Medication Sig  . diclofenac sodium (VOLTAREN) 1 % GEL Apply 4 g topically 4 (four) times daily.  (Patient not taking: Reported on 05/28/2018)  . donepezil (ARICEPT) 10 MG tablet Take 1 tablet (10 mg total) by mouth every evening. (Patient not taking: Reported on 05/28/2018)  . memantine (NAMENDA) 10 MG tablet Take 1 tablet (10 mg total) by mouth 2 (two) times daily. (Patient not taking: Reported on 05/28/2018)  . phenazopyridine (PYRIDIUM) 200 MG tablet Take 1 tablet (200 mg total) by mouth 3 (three) times daily as needed for pain. (Patient not taking: Reported on 05/28/2018)  . tamsulosin (FLOMAX) 0.4 MG CAPS capsule Take 1 capsule (0.4 mg total) by mouth daily after breakfast. Take tamsulosin only if having difficulty voiding. (Patient not taking: Reported on 05/28/2018)   No facility-administered encounter medications on file as of 03/14/2019.    Teodoro Spray, NP

## 2019-04-02 DIAGNOSIS — B351 Tinea unguium: Secondary | ICD-10-CM | POA: Diagnosis not present

## 2019-04-02 DIAGNOSIS — I739 Peripheral vascular disease, unspecified: Secondary | ICD-10-CM | POA: Diagnosis not present

## 2019-04-15 ENCOUNTER — Other Ambulatory Visit: Payer: Medicare Other | Admitting: Hospice

## 2019-04-15 ENCOUNTER — Other Ambulatory Visit: Payer: Self-pay

## 2019-04-15 DIAGNOSIS — Z515 Encounter for palliative care: Secondary | ICD-10-CM

## 2019-04-15 DIAGNOSIS — F0280 Dementia in other diseases classified elsewhere without behavioral disturbance: Secondary | ICD-10-CM

## 2019-04-15 DIAGNOSIS — G309 Alzheimer's disease, unspecified: Secondary | ICD-10-CM

## 2019-04-15 NOTE — Progress Notes (Signed)
[place] Consult Note Telephone: [phone]  Fax: [phone]  PATIENT NAME: Ernest Turner DOB: 07-04-1940 MRN: [id number]  PRIMARY CARE PROVIDER:   Thressa Sheller, MD (Inactive)  REFERRING PROVIDER:  No referring provider defined for this encounter.  RESPONSIBLE PARTY:     ASSESSMENT:     PRIMARY CARE PROVIDER:Dr Reymundo Poll [facility]  REFERRING PROVIDER:Dr Reymundo Poll [facility] RESPONSIBLE PARTY:Wendy Cassone [id number]  TELEHEALTH VISIT STATEMENT Due to the COVID-19 crisis, this visit was done via telephone from my office. It was initiated and consented to by this patient and/or family.  RECOMMENDATIONS/PLAN:  Advance Care Planning/Goals of Care:Telehealth visit consist of building trust and follow up on palliative care and patient's placement in LTC. [name] said COVID-19 has slowed the process for placement and she continues to work with [place]. Meanwhile she continues to make arrangements to ensure patient's safety and wellbeing at home. Patient remains a DNR.Goals of care include to maximize quality of life and symptom management. Symptom management:No acute concerns. Patient continues on Triamterene for left ankle edema. NP reiterated Dr Irwin Brakeman suggestion to get weighing scale to help monitor patient's weight/fluid status. Ongoing memory loss r/t Dementia, Fast 6a. Encouraged ongoing care. Patient's appetite has improved to fair. Encouraged Peanut butter crackers which he likes; encouraged finger foods and small portions offered 5-6 times a day. Follow [id number] care will continue to follow patient for goals of care clarification and symptom management. In person visit scheduled for 05/20/2019 at 3pm.  I spent22minutes providing this consultation.More than 50% of the time in this consultation was spent on coordinating communication  HISTORY OF PRESENT ILLNESS:Ernest A Harrisonis a 68 year  oldmalewith multiple medical problems including Alzheimer's dementia, HTN h/o prostate cancer. Palliative Care was asked to help address goals of care    CODE STATUS: DNR  PPS: 50% HOSPICE ELIGIBILITY/DIAGNOSIS: TBD  PAST MEDICAL HISTORY:  Past Medical History:  Diagnosis Date  . Arthritis   . BPH (benign prostatic hyperplasia)   . Depression   . ED (erectile dysfunction)   . Full dentures   . Heart murmur   . Hemorrhoids   . History of peptic ulcer   . History of radiation therapy to prostate 7800 cGy 40 sessions 04-11-2012 to 06-11-2012   EXTERNAL BEAM PELVIC AREA FOR PROSTATE CANCER  . History of urinary retention   . HOH (hard of hearing)   . Hyperlipidemia   . Hypertension   . Hypogonadism male   . Lower urinary tract symptoms (LUTS)   . Memory difficulty   . Memory loss   . Poor historian   . Prostate cancer [facility]) dx 12/26/11  urologist-  dr Gaynelle Arabian  oncologist-  dr Valere Dross   GOLD SEED IMPLANT  03-07-2012--- Adenocarcinoma,gleason=3+3=6.,& 3+4=7,PSA=15.84--  s/p  external beam radiation 04-11-2012 to 06-11-2012  . Wears glasses     SOCIAL HX:  Social History   Tobacco Use  . Smoking status: Former Smoker    Packs/day: 2.00    Years: 20.00    Pack years: 40.00    Types: Cigarettes    Quit date: 01/21/1984    Years since quitting: 35.2  . Smokeless tobacco: Never Used  Substance Use Topics  . Alcohol use: No    ALLERGIES:  Allergies  Allergen Reactions  . Androgel [Testosterone] Other (See Comments)    unknown  . Viagra [Sildenafil Citrate] Other (See Comments)    unknown     PERTINENT MEDICATIONS:  Outpatient Encounter Medications as of  04/15/2019  Medication Sig  . diclofenac sodium (VOLTAREN) 1 % GEL Apply 4 g topically 4 (four) times daily. (Patient not taking: Reported on 05/28/2018)  . donepezil (ARICEPT) 10 MG tablet Take 1 tablet (10 mg total) by mouth every evening. (Patient not taking: Reported on 05/28/2018)  . memantine (NAMENDA) 10 MG  tablet Take 1 tablet (10 mg total) by mouth 2 (two) times daily. (Patient not taking: Reported on 05/28/2018)  . phenazopyridine (PYRIDIUM) 200 MG tablet Take 1 tablet (200 mg total) by mouth 3 (three) times daily as needed for pain. (Patient not taking: Reported on 05/28/2018)  . tamsulosin (FLOMAX) 0.4 MG CAPS capsule Take 1 capsule (0.4 mg total) by mouth daily after breakfast. Take tamsulosin only if having difficulty voiding. (Patient not taking: Reported on 05/28/2018)   No facility-administered encounter medications on file as of 04/15/2019.    Teodoro Spray, NP

## 2019-05-21 ENCOUNTER — Other Ambulatory Visit: Payer: Self-pay

## 2019-05-21 ENCOUNTER — Non-Acute Institutional Stay: Payer: Medicare Other | Admitting: Hospice

## 2019-05-21 DIAGNOSIS — F0280 Dementia in other diseases classified elsewhere without behavioral disturbance: Secondary | ICD-10-CM

## 2019-05-21 DIAGNOSIS — Z515 Encounter for palliative care: Secondary | ICD-10-CM | POA: Diagnosis not present

## 2019-05-21 NOTE — Progress Notes (Signed)
[place] Consult Note Telephone: [phone]  Fax: [phone]  PATIENT NAME: Ernest Turner DOB: April 08, 1940 MRN: [id number]  PRIMARY CARE PROVIDER:Dr Reymundo Poll [facility]  REFERRING PROVIDER:Dr Reymundo Poll [facility] RESPONSIBLE PARTY:Wendy Stoen [id number]   RECOMMENDATIONS/PLAN:  Advance Care Planning/Goals of Care: visit consist of building trust and follow up on palliative care and patient's placement in LTC.Patient remains a DNR.Goals of care include to maximize quality of life and symptom management. Symptom management:2+ pitting edema in left ankle/bilateral lower extremities. Called and spoke with daughter [name] who confirmed patient has not taken Triamterene in more than 2 weeks. Education provided on the need to to take diuretic as ordered. [name] agreed she will go and make sure patient takes it throughout this week and as ordered; she verbalized understanding to alert PCP if edema did not decrease with Triamterene; Cutting back on salt and elavation of BLE encouraged. She is still working on getting LTC placement for patient. Patient pleasantly confused during visit; in no acute distress.  NP reiterated to [name] Dr Irwin Brakeman suggestion to get weighing scale to help monitor patient's weight/fluid status. Ongoing memory loss r/tDementia,Fast 6a. Encouraged ongoing care. Patient's appetite has improved to fair; he continues on  Peanut butter crackers which he likes; encouraged finger foods and small portions offered 5-6 times a day. Follow [id number] care will continue to follow patient for goals of care clarification and symptom management.   I spent 71minutes providing this consultation.More than 50% of the time in this consultation was spent on coordinating communication  HISTORY OF PRESENT ILLNESS:Ernest A Harrisonis a 38 year oldmalewith multiple medical problems including Alzheimer's dementia, HTN h/o  prostate cancer. Palliative Care was asked to help address goals of care CODE STATUS: DNR  PPS: 50% HOSPICE ELIGIBILITY/DIAGNOSIS: TBD  PAST MEDICAL HISTORY:  Past Medical History:  Diagnosis Date  . Arthritis   . BPH (benign prostatic hyperplasia)   . Depression   . ED (erectile dysfunction)   . Full dentures   . Heart murmur   . Hemorrhoids   . History of peptic ulcer   . History of radiation therapy to prostate 7800 cGy 40 sessions 04-11-2012 to 06-11-2012   EXTERNAL BEAM PELVIC AREA FOR PROSTATE CANCER  . History of urinary retention   . HOH (hard of hearing)   . Hyperlipidemia   . Hypertension   . Hypogonadism male   . Lower urinary tract symptoms (LUTS)   . Memory difficulty   . Memory loss   . Poor historian   . Prostate cancer [facility]) dx 12/26/11  urologist-  dr Gaynelle Arabian  oncologist-  dr Valere Dross   GOLD SEED IMPLANT  03-07-2012--- Adenocarcinoma,gleason=3+3=6.,& 3+4=7,PSA=15.84--  s/p  external beam radiation 04-11-2012 to 06-11-2012  . Wears glasses     SOCIAL HX:  Social History   Tobacco Use  . Smoking status: Former Smoker    Packs/day: 2.00    Years: 20.00    Pack years: 40.00    Types: Cigarettes    Quit date: 01/21/1984    Years since quitting: 35.3  . Smokeless tobacco: Never Used  Substance Use Topics  . Alcohol use: No    ALLERGIES:  Allergies  Allergen Reactions  . Androgel [Testosterone] Other (See Comments)    unknown  . Viagra [Sildenafil Citrate] Other (See Comments)    unknown     PERTINENT MEDICATIONS:  Outpatient Encounter Medications as of 05/21/2019  Medication Sig  . diclofenac sodium (VOLTAREN) 1 % GEL Apply  4 g topically 4 (four) times daily. (Patient not taking: Reported on 05/28/2018)  . donepezil (ARICEPT) 10 MG tablet Take 1 tablet (10 mg total) by mouth every evening. (Patient not taking: Reported on 05/28/2018)  . memantine (NAMENDA) 10 MG tablet Take 1 tablet (10 mg total) by mouth 2 (two) times daily. (Patient not taking:  Reported on 05/28/2018)  . phenazopyridine (PYRIDIUM) 200 MG tablet Take 1 tablet (200 mg total) by mouth 3 (three) times daily as needed for pain. (Patient not taking: Reported on 05/28/2018)  . tamsulosin (FLOMAX) 0.4 MG CAPS capsule Take 1 capsule (0.4 mg total) by mouth daily after breakfast. Take tamsulosin only if having difficulty voiding. (Patient not taking: Reported on 05/28/2018)   No facility-administered encounter medications on file as of 05/21/2019.    PHYSICAL EXAM:   General: pleasant, in no acute distress Cardiovascular: regular rate and rhythm; denies pain/discomfort Pulmonary: no adventitious lung sounds, no coughing , no SOB Abdomen: soft, nontender, + bowel sounds Extremities:  Edema 2+ to bilateral LE,  Skin: no rashes to exposed skin Neurological: Weakness but otherwise nonfocal, confused, a&o x 2  Teodoro Spray, NP

## 2019-07-22 DIAGNOSIS — G301 Alzheimer's disease with late onset: Secondary | ICD-10-CM | POA: Diagnosis not present

## 2019-07-22 DIAGNOSIS — I10 Essential (primary) hypertension: Secondary | ICD-10-CM | POA: Diagnosis not present

## 2019-07-22 DIAGNOSIS — R600 Localized edema: Secondary | ICD-10-CM | POA: Diagnosis not present

## 2019-07-26 DIAGNOSIS — R0989 Other specified symptoms and signs involving the circulatory and respiratory systems: Secondary | ICD-10-CM | POA: Diagnosis not present

## 2019-07-31 ENCOUNTER — Emergency Department (HOSPITAL_COMMUNITY): Payer: Medicare Other

## 2019-07-31 ENCOUNTER — Emergency Department (HOSPITAL_COMMUNITY)
Admission: EM | Admit: 2019-07-31 | Discharge: 2019-08-02 | Disposition: A | Discharge status: Other Acute Inpatient Hospital | Payer: Medicare Other | Attending: Emergency Medicine | Admitting: Emergency Medicine

## 2019-07-31 DIAGNOSIS — I517 Cardiomegaly: Secondary | ICD-10-CM | POA: Diagnosis not present

## 2019-07-31 DIAGNOSIS — Z046 Encounter for general psychiatric examination, requested by authority: Secondary | ICD-10-CM | POA: Diagnosis not present

## 2019-07-31 DIAGNOSIS — R2243 Localized swelling, mass and lump, lower limb, bilateral: Secondary | ICD-10-CM | POA: Insufficient documentation

## 2019-07-31 DIAGNOSIS — R32 Unspecified urinary incontinence: Secondary | ICD-10-CM | POA: Diagnosis not present

## 2019-07-31 DIAGNOSIS — Z8546 Personal history of malignant neoplasm of prostate: Secondary | ICD-10-CM | POA: Diagnosis not present

## 2019-07-31 DIAGNOSIS — R451 Restlessness and agitation: Secondary | ICD-10-CM | POA: Insufficient documentation

## 2019-07-31 DIAGNOSIS — R410 Disorientation, unspecified: Secondary | ICD-10-CM | POA: Diagnosis present

## 2019-07-31 DIAGNOSIS — F329 Major depressive disorder, single episode, unspecified: Secondary | ICD-10-CM | POA: Insufficient documentation

## 2019-07-31 DIAGNOSIS — F419 Anxiety disorder, unspecified: Secondary | ICD-10-CM | POA: Diagnosis not present

## 2019-07-31 DIAGNOSIS — Z87891 Personal history of nicotine dependence: Secondary | ICD-10-CM | POA: Insufficient documentation

## 2019-07-31 DIAGNOSIS — Z20822 Contact with and (suspected) exposure to covid-19: Secondary | ICD-10-CM | POA: Diagnosis not present

## 2019-07-31 DIAGNOSIS — G309 Alzheimer's disease, unspecified: Secondary | ICD-10-CM | POA: Insufficient documentation

## 2019-07-31 DIAGNOSIS — Z03818 Encounter for observation for suspected exposure to other biological agents ruled out: Secondary | ICD-10-CM | POA: Diagnosis not present

## 2019-07-31 DIAGNOSIS — R456 Violent behavior: Secondary | ICD-10-CM | POA: Insufficient documentation

## 2019-07-31 DIAGNOSIS — F0280 Dementia in other diseases classified elsewhere without behavioral disturbance: Secondary | ICD-10-CM | POA: Diagnosis not present

## 2019-07-31 DIAGNOSIS — I10 Essential (primary) hypertension: Secondary | ICD-10-CM | POA: Insufficient documentation

## 2019-07-31 LAB — COMPREHENSIVE METABOLIC PANEL
ALT: 17 U/L (ref 0–44)
AST: 27 U/L (ref 15–41)
Albumin: 3.5 g/dL (ref 3.5–5.0)
Alkaline Phosphatase: 42 U/L (ref 38–126)
Anion gap: 10 (ref 5–15)
BUN: 6 mg/dL — ABNORMAL LOW (ref 8–23)
CO2: 30 mmol/L (ref 22–32)
Calcium: 9.5 mg/dL (ref 8.9–10.3)
Chloride: 107 mmol/L (ref 98–111)
Creatinine, Ser: 1.14 mg/dL (ref 0.61–1.24)
GFR calc Af Amer: >60 mL/min (ref >60)
GFR calc non Af Amer: >60 mL/min (ref >60)
Glucose, Bld: 106 mg/dL — ABNORMAL HIGH (ref 70–99)
Potassium: 3.6 mmol/L (ref 3.5–5.1)
Sodium: 147 mmol/L — ABNORMAL HIGH (ref 135–145)
Total Bilirubin: 1.2 mg/dL (ref 0.3–1.2)
Total Protein: 7.4 g/dL (ref 6.5–8.1)

## 2019-07-31 LAB — CBC
HCT: 46.6 % (ref 39.0–52.0)
Hemoglobin: 14.3 g/dL (ref 13.0–17.0)
MCH: 29.4 pg (ref 26.0–34.0)
MCHC: 30.7 g/dL (ref 30.0–36.0)
MCV: 95.9 fL (ref 80.0–100.0)
Platelets: 122 10*3/uL — ABNORMAL LOW (ref 150–400)
RBC: 4.86 MIL/uL (ref 4.22–5.81)
RDW: 14.6 % (ref 11.5–15.5)
WBC: 5.2 10*3/uL (ref 4.0–10.5)
nRBC: 0 % (ref 0.0–0.2)

## 2019-07-31 LAB — ACETAMINOPHEN LEVEL: Acetaminophen (Tylenol), Serum: <10 ug/mL — ABNORMAL LOW (ref 10–30)

## 2019-07-31 LAB — ETHANOL: Alcohol, Ethyl (B): <10 mg/dL (ref <10)

## 2019-07-31 LAB — SARS CORONAVIRUS 2 BY RT PCR (HOSPITAL ORDER, PERFORMED IN ~~LOC~~ HOSPITAL LAB): SARS Coronavirus 2: NEGATIVE

## 2019-07-31 LAB — SALICYLATE LEVEL: Salicylate Lvl: <7 mg/dL — ABNORMAL LOW (ref 7.0–30.0)

## 2019-07-31 MED ORDER — TRIAMTERENE-HCTZ 37.5-25 MG PO TABS
1.0000 | ORAL_TABLET | Freq: Every morning | ORAL | Status: DC
  Filled 2019-07-31 (×4): qty 1

## 2019-07-31 NOTE — ED Notes (Signed)
Daughter wanting pt to go to [facility]. They do inpatient IVCs for Alzheimers and Dementia.

## 2019-07-31 NOTE — ED Notes (Signed)
The pt is very confused he does not know why or where he is at present

## 2019-07-31 NOTE — Care Management (Addendum)
ED TOC team noted consult for placement, reviewed record noted patient was brought in by [facility] IVC'd by daughter due to patient running out into traffic. Patient does not have any skill needs therefore would not meet criteria for a SNF placement.  Spoke with daughter Pryce Folts [phone] she states patient has a hx of dementia/ Alzheimer which has gotten increasingly progressive, with some aggression of recent.  TOC team updated with Dr. Johnney Killian EDP who states that EDP who was on earlier did not think patient met criteria for IVC, but patient remains IVC'd. Dr. Colvin Caroli states she will review IVC paperwork. TOC team will continue to follow up to assist with safe discharge from the ED

## 2019-07-31 NOTE — ED Notes (Signed)
Pt up out of bed  Safety sitter at his bedside gets back in the bed

## 2019-07-31 NOTE — ED Provider Notes (Addendum)
[place] EMERGENCY DEPARTMENT Provider Note   CSN: [id number] Arrival date & time: 07/31/19  1212     History Chief Complaint  Patient presents with  . Psychiatric Evaluation    Ernest Turner is a 79 y.o. male.  Patient with a known history of advanced dementia.  Seems to be Alzheimer's type.  Patient has been followed by palliative care at home for the past several months.  Brought into day because patient was wandering around on the streets daughter is working try to get him into a facility however patient always refuses Covid swab and therefore cannot be placed.  Per [employer] patient was found wandering on the highway earlier today.  Patient pleasant on arrival but usually is past notes state that he is.  Past medical history is significant for hypertension prostate cancer hyperlipidemia.  Patient is former smoker quit 1985.        Past Medical History:  Diagnosis Date  . Arthritis   . BPH (benign prostatic hyperplasia)   . Depression   . ED (erectile dysfunction)   . Full dentures   . Heart murmur   . Hemorrhoids   . History of peptic ulcer   . History of radiation therapy to prostate 7800 cGy 40 sessions 04-11-2012 to 06-11-2012   EXTERNAL BEAM PELVIC AREA FOR PROSTATE CANCER  . History of urinary retention   . HOH (hard of hearing)   . Hyperlipidemia   . Hypertension   . Hypogonadism male   . Lower urinary tract symptoms (LUTS)   . Memory difficulty   . Memory loss   . Poor historian   . Prostate cancer [facility]) dx 12/26/11  urologist-  dr Gaynelle Arabian  oncologist-  dr Valere Dross   GOLD SEED IMPLANT  03-07-2012--- Adenocarcinoma,gleason=3+3=6.,& 3+4=7,PSA=15.84--  s/p  external beam radiation 04-11-2012 to 06-11-2012  . Wears glasses     Patient Active Problem List   Diagnosis Date Noted  . Dementia ([place]) 08/12/2015  . Nausea & vomiting 04/04/2012  . BPH (benign prostatic hyperplasia)   . Hypogonadism male   . Allergy     . Arthritis   . Depression   . GERD (gastroesophageal reflux disease)   . Hypercholesterolemia   . Heart murmur   . Ulcer   . ED (erectile dysfunction)   . Prostate cancer ([place]) 12/26/2011    Past Surgical History:  Procedure Laterality Date  . CIRCUMCISION  09-21-2006  . COLONOSCOPY    . CYSTOSCOPY Right 10/16/2014   Procedure: CYSTO, RIGHT RETROGRADE COOK BALLOON DILATION RIGHT PROXIMAL URETHERAL STRICTURE;  Surgeon: Carolan Clines, MD;  Location: [place];  Service: Urology;  Laterality: Right;  . INGUINAL HERNIA REPAIR Left 04/29/2014   Procedure: LEFT INGUINAL HERNIA REPAIR;  Surgeon: Coralie Keens, MD;  Location: [place];  Service: General;  Laterality: Left;  . INSERTION OF MESH Left 04/29/2014   Procedure: INSERTION OF MESH;  Surgeon: Coralie Keens, MD;  Location: [place];  Service: General;  Laterality: Left;  . MOUTH SURGERY     gum surgery  . MULTIPLE TOOTH EXTRACTIONS    . PROSTATE BIOPSY  12/26/11   PSA=15.84,gleason=3+3=6,& 3+4=7,Volume=27.98cc,Adenocarcinoma       Family History  Problem Relation Age of Onset  . Cancer Sister        liver to brain mets died late 31's  . Cancer Daughter        breast cancer died age 75  . Diabetes Mother     Social  History   Tobacco Use  . Smoking status: Former Smoker    Packs/day: 2.00    Years: 20.00    Pack years: 40.00    Types: Cigarettes    Quit date: 01/21/1984    Years since quitting: 35.5  . Smokeless tobacco: Never Used  Substance Use Topics  . Alcohol use: No  . Drug use: No    Home Medications Prior to Admission medications   Medication Sig Start Date End Date Taking? Authorizing Provider  diclofenac sodium (VOLTAREN) 1 % GEL Apply 4 g topically 4 (four) times daily. Patient not taking: Reported on 05/28/2018 08/12/15   Marcial Pacas, MD  donepezil (ARICEPT) 10 MG tablet Take 1 tablet (10 mg total) by mouth every evening. Patient not taking: Reported on 05/28/2018 08/12/15   Marcial Pacas,  MD  memantine (NAMENDA) 10 MG tablet Take 1 tablet (10 mg total) by mouth 2 (two) times daily. Patient not taking: Reported on 05/28/2018 08/12/15   Marcial Pacas, MD  phenazopyridine (PYRIDIUM) 200 MG tablet Take 1 tablet (200 mg total) by mouth 3 (three) times daily as needed for pain. Patient not taking: Reported on 05/28/2018 10/16/14   Carolan Clines, MD  tamsulosin (FLOMAX) 0.4 MG CAPS capsule Take 1 capsule (0.4 mg total) by mouth daily after breakfast. Take tamsulosin only if having difficulty voiding. Patient not taking: Reported on 05/28/2018 10/16/14   Carolan Clines, MD    Allergies    Androgel [testosterone] and Viagra [sildenafil citrate]  Review of Systems   Review of Systems  Unable to perform ROS: Dementia    Physical Exam Updated Vital Signs BP (!) 155/80 (BP Location: Left Arm)   Pulse 80   Temp 97.6 F (36.4 C) (Oral)   Resp 16   SpO2 98%   Physical Exam Vitals and nursing note reviewed.  Constitutional:      Appearance: Normal appearance. He is well-developed.  HENT:     Head: Normocephalic and atraumatic.  Eyes:     Extraocular Movements: Extraocular movements intact.     Conjunctiva/sclera: Conjunctivae normal.     Pupils: Pupils are equal, round, and reactive to light.  Cardiovascular:     Rate and Rhythm: Normal rate and regular rhythm.     Heart sounds: No murmur.  Pulmonary:     Effort: Pulmonary effort is normal. No respiratory distress.     Breath sounds: Normal breath sounds.  Abdominal:     Palpations: Abdomen is soft.     Tenderness: There is no abdominal tenderness.  Musculoskeletal:     Cervical back: Neck supple.     Right lower leg: Edema present.     Left lower leg: Edema present.  Skin:    General: Skin is warm and dry.     Capillary Refill: Capillary refill takes less than 2 seconds.  Neurological:     Mental Status: He is alert.     Comments: Clearly has dementia confusion pleasant.  But will move all 4 extremities.      ED Results / Procedures / Treatments   Labs (all labs ordered are listed, but only abnormal results are displayed) Labs Reviewed  COMPREHENSIVE METABOLIC PANEL - Abnormal; Notable for the following components:      Result Value   Sodium 147 (*)    Glucose, Bld 106 (*)    BUN 6 (*)    All other components within normal limits  SALICYLATE LEVEL - Abnormal; Notable for the following components:   Salicylate Lvl Q000111Q (*)  All other components within normal limits  ACETAMINOPHEN LEVEL - Abnormal; Notable for the following components:   Acetaminophen (Tylenol), Serum <10 (*)    All other components within normal limits  CBC - Abnormal; Notable for the following components:   Platelets 122 (*)    All other components within normal limits  SARS CORONAVIRUS 2 BY RT PCR (HOSPITAL ORDER, [place] LAB)  ETHANOL  RAPID URINE DRUG SCREEN, HOSP PERFORMED   Results for orders placed or performed during the hospital encounter of 07/31/19  SARS Coronavirus 2 by RT PCR (hospital order, performed in [place] hospital lab) Nasopharyngeal Nasopharyngeal Swab   Specimen: Nasopharyngeal Swab  Result Value Ref Range   SARS Coronavirus 2 NEGATIVE NEGATIVE  Comprehensive metabolic panel  Result Value Ref Range   Sodium 147 (H) 135 - 145 mmol/L   Potassium 3.6 3.5 - 5.1 mmol/L   Chloride 107 98 - 111 mmol/L   CO2 30 22 - 32 mmol/L   Glucose, Bld 106 (H) 70 - 99 mg/dL   BUN 6 (L) 8 - 23 mg/dL   Creatinine, Ser 1.14 0.61 - 1.24 mg/dL   Calcium 9.5 8.9 - 10.3 mg/dL   Total Protein 7.4 6.5 - 8.1 g/dL   Albumin 3.5 3.5 - 5.0 g/dL   AST 27 15 - 41 U/L   ALT 17 0 - 44 U/L   Alkaline Phosphatase 42 38 - 126 U/L   Total Bilirubin 1.2 0.3 - 1.2 mg/dL   GFR calc non Af Amer >60 >60 mL/min   GFR calc Af Amer >60 >60 mL/min   Anion gap 10 5 - 15  Ethanol  Result Value Ref Range   Alcohol, Ethyl (B) Q000111Q Q000111Q mg/dL  Salicylate level  Result Value Ref Range   Salicylate  Lvl Q000111Q (L) 7.0 - 30.0 mg/dL  Acetaminophen level  Result Value Ref Range   Acetaminophen (Tylenol), Serum <10 (L) 10 - 30 ug/mL  cbc  Result Value Ref Range   WBC 5.2 4.0 - 10.5 K/uL   RBC 4.86 4.22 - 5.81 MIL/uL   Hemoglobin 14.3 13.0 - 17.0 g/dL   HCT 46.6 39.0 - 52.0 %   MCV 95.9 80.0 - 100.0 fL   MCH 29.4 26.0 - 34.0 pg   MCHC 30.7 30.0 - 36.0 g/dL   RDW 14.6 11.5 - 15.5 %   Platelets 122 (L) 150 - 400 K/uL   nRBC 0.0 0.0 - 0.2 %    EKG None  Radiology No results found.  Procedures Procedures (including critical care time)  Medications Ordered in ED Medications - No data to display  ED Course  I have reviewed the triage vital signs and the nursing notes.  Pertinent labs & imaging results that were available during my care of the patient were reviewed by me and considered in my medical decision making (see chart for details).    MDM Rules/Calculators/A&P                      Covid test is negative.  Patient's labs without significant abnormalities.  Placed a consult into case management social worker for assistance with placement into nursing facility.  Do not feel that patient needs psychiatric evaluation.  Feel that all this is secondary to dementia.  Feel that patient needs EKG and chest x-ray performed totally medically cleared.  These have been ordered.  We will have evening ED physician follow-up on these.  Final Clinical Impression(s) /  ED Diagnoses Final diagnoses:  Alzheimer's dementia without behavioral disturbance, unspecified timing of dementia onset [facility])    Rx / DC Orders ED Discharge Orders    None       Fredia Sorrow, MD 07/31/19 1452    Fredia Sorrow, MD 07/31/19 1453

## 2019-07-31 NOTE — ED Notes (Signed)
Ambulated to restroom with staff.

## 2019-07-31 NOTE — ED Provider Notes (Addendum)
Long h/o Alzheimer dementia. Wandered onto highway today. Not aggressive, needs SNF placement. SW is already consulted.  I was called by [facility] Police counselor Donald Prose.  He advises that he did IVC papers for the patient based on significant risk of self injury to the patient.  He advises the patient has walked into traffic 3 times within the past week.  [employer] has gone to assist the patient but there is significant concern for the patient's safety and possibly the safety of others vis--vis the patient being out in traffic and possibly causing an accident.  He also advises that the patient has not been eating and the patient's daughter is not able to keep him safe at home. Physical Exam  BP (!) 155/80 (BP Location: Left Arm)   Pulse 80   Temp 97.6 F (36.4 C) (Oral)   Resp 16   SpO2 98%   Physical Exam Constitutional:      Comments: Patient is sitting calmly at the edge of the bed.  No respiratory distress.  Pleasantly interactive.  He laughs but his conversation is difficult to follow and not situationally oriented.  HENT:     Head: Normocephalic and atraumatic.     Mouth/Throat:     Pharynx: Oropharynx is clear.  Eyes:     Extraocular Movements: Extraocular movements intact.     Conjunctiva/sclera: Conjunctivae normal.  Cardiovascular:     Rate and Rhythm: Normal rate and regular rhythm.  Pulmonary:     Effort: Pulmonary effort is normal.     Breath sounds: Normal breath sounds.  Abdominal:     General: There is no distension.     Palpations: Abdomen is soft.     Tenderness: There is no abdominal tenderness. There is no guarding.  Musculoskeletal:     Comments: Trace to 1+ edema bilateral ankles calves soft nontender.  Skin:    General: Skin is warm and dry.  Neurological:     Comments: Patient is alert.  He is conversant but his speech is rambling and soft and difficult to understand.  Content is variable.  He does follow commands.  No focal motor deficits.   Psychiatric:     Comments: Mood is good.  Patient is pleasant and interactive.  He laughs frequently.     ED Course/Procedures     Procedures  MDM   Patient is brought to the emergency department by [facility] Department for wandering into traffic with concern for injury to self or possibly motorist by causing an accident.  This has been happening recurrently over the past week or more.  Patient has severe dementia and his daughter is no longer able to keep him in the home and safe.  Reportedly he has also stopped eating.  After discussion with counselor who first evaluated the patient for IVC, I will do IVC paperwork.  The [employer] counselor had discussed the case with the dementia/memory care unit at [facility].       Charlesetta Shanks, MD 07/31/19 [place], MD 07/31/19 2200

## 2019-07-31 NOTE — ED Notes (Signed)
Pt has a Air cabin crew

## 2019-07-31 NOTE — ED Notes (Signed)
Pt incontinent of urine  He has been into the br several times .  His dinner has just arrived we are unable to get him to eat

## 2019-07-31 NOTE — ED Notes (Signed)
Pt cleaned up dry scrubs and dry socks placed on  The pt  We cannot interest him in his tray of food the pt wants to go outside of the room difficulty getting to stay in the room

## 2019-07-31 NOTE — ED Notes (Signed)
Dr. Zackowski at bedside  

## 2019-07-31 NOTE — ED Notes (Signed)
Pt refusing to sit, pt pleasant but confused. Pt wanting to leave, does not seem to understand what is going on.

## 2019-07-31 NOTE — ED Triage Notes (Signed)
Pt bib [employer] IVC, pt with advanced dementia, has been wandering around on the streets. Daughter is working on trying to get pt into a facility, however pt always refuses covid swab and therefore cannot be placed. Per [employer], pt was found wandering on the highway earlier today. Pt pleasant on arrival.

## 2019-08-01 ENCOUNTER — Other Ambulatory Visit: Payer: Self-pay

## 2019-08-01 MED ORDER — ZIPRASIDONE MESYLATE 20 MG IM SOLR
INTRAMUSCULAR | Status: AC
  Administered 2019-08-01: 10 mg via INTRAMUSCULAR
  Filled 2019-08-01: qty 20

## 2019-08-01 MED ORDER — ZIPRASIDONE MESYLATE 20 MG IM SOLR: 10.0000 mg | Freq: Once | INTRAMUSCULAR | Status: AC

## 2019-08-01 MED ORDER — ZIPRASIDONE MESYLATE 20 MG IM SOLR: 10.0000 mg | Freq: Once | INTRAMUSCULAR | Status: AC | PRN

## 2019-08-01 MED ORDER — STERILE WATER FOR INJECTION IJ SOLN
INTRAMUSCULAR | Status: AC
  Administered 2019-08-01: 0.6 mL
  Filled 2019-08-01: qty 10

## 2019-08-01 NOTE — ED Notes (Signed)
Pt still awake and pacing the room. Pt will not sit down or lay in bed. Sitter at doorway. Pt a high fall risk. Will speak with md about something for sleep for pt.

## 2019-08-01 NOTE — ED Notes (Signed)
This RN and three NT changed brief and pants for pt. He is very uncooperative for this activity.

## 2019-08-01 NOTE — ED Notes (Signed)
IVC  bfast ordered  

## 2019-08-01 NOTE — ED Notes (Signed)
This RN, the sitter and the NT all explained the need for dry pants that fit to the pt. The pt was uncooperative. Eventually, the pants were changed.

## 2019-08-01 NOTE — ED Notes (Signed)
Pt required redirection and intervention from this RN, the sitter and the off duty [employer] officer to return to his room.

## 2019-08-01 NOTE — Progress Notes (Signed)
Patient will not be seen by TTS due to his advanced dementia, medication adjustments have been recommended.  CSW was advised by Benjamine Mola, RN that this patient's daughter has been in contact with staff at [facility] for possible admission.   CSW spoke with Tori at [facility] who states there are beds available at this time and she is agreeable to review the referral.   CSW sent clinical information to [phone] for review.  Madilyn Fireman, MSW, LCSW-A Transitions of Care  Clinical Social Worker  [facility] Emergency Departments  Medical ICU [phone]

## 2019-08-01 NOTE — ED Notes (Signed)
Requested medication to keep pt and staff safe as pt behavior is escalating.

## 2019-08-01 NOTE — BH Assessment (Signed)
Called to follow up with patient's status. His nurse states that he is now up and ready to be assessed.

## 2019-08-01 NOTE — NC FL2 (Signed)
[place] LEVEL OF CARE SCREENING TOOL     IDENTIFICATION  Patient Name: Ernest Turner Birthdate: Sep 10, 1940 Sex: male Admission Date (Current Location): 07/31/2019  [facility] and [place] Number:  Kathleen Argue [id number] [place] and Address:         Provider Number: [id number]  Attending Physician Name and Address:  Default, Provider, MD  Relative Name and Phone Number:  Zacharie Totino, [phone]    Current Level of Care: Hospital Recommended Level of Care: Memory Care Prior Approval Number:    Date Approved/Denied:   PASRR Number:    Discharge Plan:      Current Diagnoses: Patient Active Problem List   Diagnosis Date Noted  . Dementia ([place]) 08/12/2015  . Nausea & vomiting 04/04/2012  . BPH (benign prostatic hyperplasia)   . Hypogonadism male   . Allergy   . Arthritis   . Depression   . GERD (gastroesophageal reflux disease)   . Hypercholesterolemia   . Heart murmur   . Ulcer   . ED (erectile dysfunction)   . Prostate cancer ([place]) 12/26/2011    Orientation RESPIRATION BLADDER Height & Weight        Normal Incontinent Weight:   Height:     BEHAVIORAL SYMPTOMS/MOOD NEUROLOGICAL BOWEL NUTRITION STATUS  Wanderer   Incontinent    AMBULATORY STATUS COMMUNICATION OF NEEDS Skin   Independent Verbally Other (Comment)(Edema to both legs)                       Personal Care Assistance Level of Assistance  Bathing, Feeding, Dressing Bathing Assistance: Limited assistance Feeding assistance: Independent Dressing Assistance: Limited assistance     Functional Limitations Info  Sight, Hearing, Speech Sight Info: Impaired(Wears glasses) Hearing Info: Adequate Speech Info: Adequate    SPECIAL CARE FACTORS FREQUENCY                       Contractures Contractures Info: Not present    Additional Factors Info  Allergies   Allergies Info: Viagra, Testosterone, Nutritional Supplements           Current Medications  (08/01/2019):  This is the current hospital active medication list Current Facility-Administered Medications  Medication Dose Route Frequency Provider Last Rate Last Admin  . triamterene-hydrochlorothiazide (MAXZIDE-25) 37.5-25 MG per tablet 1 tablet  1 tablet Oral q AM Pfeiffer, Marcy, MD      . ziprasidone (GEODON) injection 10 mg  10 mg Intramuscular Once PRN Davonna Belling, MD       Current Outpatient Medications  Medication Sig Dispense Refill  . triamterene-hydrochlorothiazide (MAXZIDE-25) 37.5-25 MG tablet Take 1 tablet by mouth in the morning.    . diclofenac sodium (VOLTAREN) 1 % GEL Apply 4 g topically 4 (four) times daily. (Patient not taking: Reported on 07/31/2019) 100 g 6  . donepezil (ARICEPT) 10 MG tablet Take 1 tablet (10 mg total) by mouth every evening. (Patient not taking: Reported on 07/31/2019) 30 tablet 11  . memantine (NAMENDA) 10 MG tablet Take 1 tablet (10 mg total) by mouth 2 (two) times daily. (Patient not taking: Reported on 07/31/2019) 60 tablet 11  . phenazopyridine (PYRIDIUM) 200 MG tablet Take 1 tablet (200 mg total) by mouth 3 (three) times daily as needed for pain. (Patient not taking: Reported on 07/31/2019) 30 tablet 3  . tamsulosin (FLOMAX) 0.4 MG CAPS capsule Take 1 capsule (0.4 mg total) by mouth daily after breakfast. Take tamsulosin only if having difficulty voiding. (Patient not taking:  Reported on 07/31/2019) 30 capsule 11     Discharge Medications: Please see discharge summary for a list of discharge medications.  Relevant Imaging Results:  Relevant Lab Results:   Additional Information SSN: [id number]  Archie Endo, LCSW

## 2019-08-01 NOTE — ED Notes (Signed)
Pt dtr Ernest Turner, indicated that contact had been made with [facility]) via Dr. Fredderick Phenix had filled out SL2 for this pt. I will msg SW to inform them.

## 2019-08-01 NOTE — ED Notes (Addendum)
Pt is now hitting staff members trying to leave the room. MD made aware and asked again for medication for agitation. Security called to [place].

## 2019-08-01 NOTE — ED Notes (Signed)
Lunch Tray Ordered @ 1703. 

## 2019-08-01 NOTE — ED Notes (Signed)
Lunch Tray Ordered @ 1033. 

## 2019-08-01 NOTE — ED Provider Notes (Signed)
79 yo M with a chief complaints of worsening dementia.  Family can no longer take care of him.  He is currently IVC than waiting placement.  I was called by the nursing staff because the patient was becoming increasingly irritable and had tried to strike some of the staff.  Seems directable on my initial exam but refusing to take p.o.  Continues to be agitated and not following commands.  Will give a dose of Geodon.   Deno Etienne, DO 08/01/19 [phone]

## 2019-08-01 NOTE — Discharge Planning (Signed)
Pt daughter called for update on pt status.

## 2019-08-01 NOTE — ED Notes (Addendum)
Texted md to ask for medication for pt to help with increase in anxiety, restless and sleep.

## 2019-08-01 NOTE — BH Assessment (Signed)
Reviewed notes regarding pt hx. TTS assessment unable to be obtained due to pt's advanced dementia (Alzheimer's). Consulted with Mordecai Maes, NP who recommends pt's home meds (pertaining to dementia) be restarted and add Seroquel 25 mg q hs.

## 2019-08-01 NOTE — BH Assessment (Signed)
Patient given Geodon at 0220 due to combative behaviors.Marland Kitchen He is not able to be assessed. Patient's nurse will call TTS when he is aroused and ready to bee seen.

## 2019-08-01 NOTE — Care Management (Signed)
ED TOC team spoke with daugther [name], she was updated that a psych eval was declined due to advanced dementia. Also made her aware that [facility] declined the referral due to not being able to meet patient's level of care.  Daughter states, she cannot take him to stay with her. Explained that the [facility] team will follow up in the am.

## 2019-08-01 NOTE — ED Provider Notes (Signed)
  Physical Exam  BP (!) 143/117 (BP Location: Right Arm)   Pulse 75   Temp 98.2 F (36.8 C) (Oral)   Resp 20   SpO2 97%   Physical Exam  ED Course/Procedures     Procedures  MDM  Nursing states patient behavior is escalating.  Requested as needed medication.  Appears of Geodon has worked previously.  Wrote for 10 mg IM as needed.       Davonna Belling, MD 08/01/19 1146

## 2019-08-02 ENCOUNTER — Emergency Department: Payer: Medicare Other

## 2019-08-02 DIAGNOSIS — Z743 Need for continuous supervision: Secondary | ICD-10-CM | POA: Diagnosis not present

## 2019-08-02 DIAGNOSIS — R279 Unspecified lack of coordination: Secondary | ICD-10-CM | POA: Diagnosis not present

## 2019-08-02 DIAGNOSIS — R410 Disorientation, unspecified: Secondary | ICD-10-CM | POA: Diagnosis not present

## 2019-08-02 DIAGNOSIS — Z23 Encounter for immunization: Secondary | ICD-10-CM

## 2019-08-02 DIAGNOSIS — I10 Essential (primary) hypertension: Secondary | ICD-10-CM | POA: Diagnosis not present

## 2019-08-02 LAB — URINALYSIS, ROUTINE W REFLEX MICROSCOPIC
Bilirubin Urine: NEGATIVE
Glucose, UA: NEGATIVE mg/dL
Hgb urine dipstick: NEGATIVE
Ketones, ur: 5 mg/dL — AB
Leukocytes,Ua: NEGATIVE
Nitrite: NEGATIVE
Protein, ur: NEGATIVE mg/dL
Specific Gravity, Urine: 1.018 (ref 1.005–1.030)
pH: 6 (ref 5.0–8.0)

## 2019-08-02 MED ORDER — TUBERCULIN PPD 5 UNIT/0.1ML ID SOLN
5.0000 [IU] | INTRADERMAL | Status: DC
  Administered 2019-08-02: 5 [IU] via INTRADERMAL
  Filled 2019-08-02: qty 0.1

## 2019-08-02 MED ORDER — STERILE WATER FOR INJECTION IJ SOLN
INTRAMUSCULAR | Status: AC
  Administered 2019-08-02: 0.6 mL
  Filled 2019-08-02: qty 10

## 2019-08-02 MED ORDER — ZIPRASIDONE MESYLATE 20 MG IM SOLR
INTRAMUSCULAR | Status: AC
  Administered 2019-08-02: 10 mg via INTRAMUSCULAR
  Filled 2019-08-02: qty 20

## 2019-08-02 MED ORDER — RISPERIDONE 0.25 MG PO TABS
0.2500 mg | ORAL_TABLET | Freq: Every day | ORAL | 0 refills | Status: DC
Start: 2019-08-02 — End: 2019-10-23

## 2019-08-02 NOTE — ED Notes (Signed)
Pt cooperative at this time. Listening to music at the computer.

## 2019-08-02 NOTE — ED Notes (Signed)
Lunch ordered 

## 2019-08-02 NOTE — ED Provider Notes (Addendum)
10:21 AM patient holding for placement in [place]. Patient has been agitated at times, requiring Geodon. Reviewed labs. Noted he has not had UA. Will obtain if possible.   Social work continues to look for placement.  BP (!) (P) 153/87 (BP Location: Left Arm)   Pulse (P) 69   Temp (P) 97.6 F (36.4 C) (Axillary)   Resp (P) 18   SpO2 (P) 97%   2:40 PM UA negative.    Carlisle Cater, PA-C 08/02/19 1024    Carlisle Cater, PA-C 08/02/19 1440    Tegeler, Gwenyth Allegra, MD 08/02/19 [phone]

## 2019-08-02 NOTE — ED Notes (Signed)
Lunch Tray Ordered @ 1051. 

## 2019-08-02 NOTE — Progress Notes (Signed)
Pt is going to [place] # for report [phone]

## 2019-08-02 NOTE — Progress Notes (Addendum)
2:40pm The patient will go to room 403 at [facility]. The number to call for report is [phone]. The patient will be transported by [facility].   CSW will arrange for transportation whenever the patient's IVC has been rescinded.  2pm: CSW finalized discharge plan with patient's daughter Ernest Turner. Ernest Turner is in route to [facility] to complete paperwork.  Patient received his COVID vaccine and a TB skin test.  Transportation can be arranged for patient to [facility] via [place] after CSW is notified that everything has been completed.  12:45pm: CSW received return call from [place] at [facility] who states the facility does not have any Medicaid beds available at this time.  11:40am: Patient needs TB skin test to be placed prior to admission to [facility], once placed it can be read at the facility.   CSW notified Dr. Maryan Rued of request.  11:15am: CSW notified [facility] leadership of this patient.  CSW completed PASSR screening for patient - screening still running. The required documents will be submitted.  10am: CSW spoke with Claudette Head at [facility] who agreed to review this patient's referral. CSW sent information over for review.  8am: CSW spoke with Pamala Hurry at [facility] who states that residents must be fully vaccinated before admission is considered.  CSW spoke with patient's daughter Ernest Turner who states the patient has not received a COVID vaccine. Ernest Turner requested CSW reach out to [facility] to determine their bed availability and request a review of the patient.  CSW left voicemail with Freda Munro at [facility] to inquire about bed availability in the locked unit. CSW sent clinical information in the hub for review.  CSW spoke with Chantel at [place] [facility] about patient - patient's diagnosis is too severe for admission consideration.   CSW left voicemail for admissions at [facility] requesting a return call.  Madilyn Fireman, MSW, LCSW-A Transitions of Care   Clinical Social Worker  [facility] Emergency Departments  Medical ICU [phone]

## 2019-08-02 NOTE — ED Notes (Signed)
Noted pt has pending ordered urine specimen. Sitter to assist and encourage pt to provide sample per MD order.

## 2019-08-02 NOTE — ED Notes (Signed)
IVC  bfast ordered  

## 2019-08-02 NOTE — ED Notes (Signed)
Pt becoming increasingly more agitated wanting to grab sitter and computer, and not wanting to follow direction or stay in room. Will try to redirect, if that does not work, will see about medication.

## 2019-08-02 NOTE — Progress Notes (Signed)
   Covid-19 Vaccination Clinic  Name:  Ernest Turner    MRN: [id number] DOB: 06-Nov-1940  08/02/2019  [name] was observed post Covid-19 immunization for 15 minutes without incident. He was provided with Vaccine Information Sheet and instruction to access the V-Safe system.   [name] was instructed to call 911 with any severe reactions post vaccine: Marland Kitchen Difficulty breathing  . Swelling of face and throat  . A fast heartbeat  . A bad rash all over body  . Dizziness and weakness   Immunizations Administered    Name Date Dose VIS Date Route   JANSSEN COVID-19 VACCINE 08/02/2019 12:14 PM 0.5 mL 05/18/2019 Intramuscular   Manufacturer: [employer]   Lot: [id number]   [place]: [phone]

## 2019-08-02 NOTE — BHH Counselor (Signed)
Patient seen by TTS for re-assessment, along with Priscille Loveless, PMHNP, who recommends patient be psych cleared with a consult to social work.

## 2019-08-02 NOTE — Progress Notes (Signed)
CSW called [facility] for transfer

## 2019-08-02 NOTE — ED Notes (Signed)
TB injection test to pt left inner lower arm. To be read in 48 hours on 08/04/19 @ 1325

## 2019-08-02 NOTE — ED Notes (Signed)
Dinner tray ordered.

## 2019-08-02 NOTE — ED Notes (Signed)
Sitter at bedside assisting pt with lunch,

## 2019-08-04 NOTE — ED Notes (Signed)
This RN spoke with Coy Saunas patient's daughter to inform that patient left belongings when transferred to [facility]. Mrs.Beichner will come by to pick up belongings on 08/05/2019; Staff at [facility] was also contacted in regards to left belongings; Belongings will be left in [place] [address] in purple zone-Monique,RN

## 2019-08-05 DIAGNOSIS — J302 Other seasonal allergic rhinitis: Secondary | ICD-10-CM | POA: Diagnosis not present

## 2019-08-05 DIAGNOSIS — G301 Alzheimer's disease with late onset: Secondary | ICD-10-CM | POA: Diagnosis not present

## 2019-08-07 NOTE — Progress Notes (Addendum)
CSW opened chart in order to assist [facility] in obtaining a record of TB test for Pt chart at facility.  CSW faxed a copy of med order of TB test to Jimmie Molly of [facility] @ [phone]

## 2019-09-06 ENCOUNTER — Emergency Department (HOSPITAL_COMMUNITY)
Admission: EM | Admit: 2019-09-06 | Discharge: 2019-09-06 | Disposition: A | Discharge status: Home / Self Care | Payer: Medicare Other | Attending: Emergency Medicine | Admitting: Emergency Medicine

## 2019-09-06 ENCOUNTER — Emergency Department (HOSPITAL_COMMUNITY): Payer: Medicare Other

## 2019-09-06 ENCOUNTER — Encounter (HOSPITAL_COMMUNITY): Payer: Self-pay | Admitting: Emergency Medicine

## 2019-09-06 ENCOUNTER — Other Ambulatory Visit: Payer: Self-pay

## 2019-09-06 DIAGNOSIS — R1084 Generalized abdominal pain: Secondary | ICD-10-CM | POA: Diagnosis not present

## 2019-09-06 DIAGNOSIS — I517 Cardiomegaly: Secondary | ICD-10-CM | POA: Diagnosis not present

## 2019-09-06 DIAGNOSIS — Z743 Need for continuous supervision: Secondary | ICD-10-CM | POA: Diagnosis not present

## 2019-09-06 DIAGNOSIS — F0390 Unspecified dementia without behavioral disturbance: Secondary | ICD-10-CM | POA: Insufficient documentation

## 2019-09-06 DIAGNOSIS — R404 Transient alteration of awareness: Secondary | ICD-10-CM | POA: Diagnosis not present

## 2019-09-06 DIAGNOSIS — I10 Essential (primary) hypertension: Secondary | ICD-10-CM | POA: Insufficient documentation

## 2019-09-06 DIAGNOSIS — J9811 Atelectasis: Secondary | ICD-10-CM | POA: Diagnosis not present

## 2019-09-06 DIAGNOSIS — R4182 Altered mental status, unspecified: Secondary | ICD-10-CM | POA: Diagnosis not present

## 2019-09-06 DIAGNOSIS — R6889 Other general symptoms and signs: Secondary | ICD-10-CM | POA: Diagnosis not present

## 2019-09-06 DIAGNOSIS — R531 Weakness: Secondary | ICD-10-CM | POA: Diagnosis not present

## 2019-09-06 DIAGNOSIS — R0902 Hypoxemia: Secondary | ICD-10-CM | POA: Diagnosis not present

## 2019-09-06 DIAGNOSIS — Z7401 Bed confinement status: Secondary | ICD-10-CM | POA: Diagnosis not present

## 2019-09-06 DIAGNOSIS — M2550 Pain in unspecified joint: Secondary | ICD-10-CM | POA: Diagnosis not present

## 2019-09-06 DIAGNOSIS — Z79899 Other long term (current) drug therapy: Secondary | ICD-10-CM | POA: Diagnosis not present

## 2019-09-06 DIAGNOSIS — R410 Disorientation, unspecified: Secondary | ICD-10-CM | POA: Diagnosis not present

## 2019-09-06 DIAGNOSIS — R569 Unspecified convulsions: Secondary | ICD-10-CM | POA: Diagnosis not present

## 2019-09-06 DIAGNOSIS — K449 Diaphragmatic hernia without obstruction or gangrene: Secondary | ICD-10-CM | POA: Diagnosis not present

## 2019-09-06 LAB — RAPID URINE DRUG SCREEN, HOSP PERFORMED
Amphetamines: NOT DETECTED
Barbiturates: NOT DETECTED
Benzodiazepines: NOT DETECTED
Cocaine: NOT DETECTED
Opiates: NOT DETECTED
Tetrahydrocannabinol: NOT DETECTED

## 2019-09-06 LAB — CBC WITH DIFFERENTIAL/PLATELET
Abs Immature Granulocytes: 0.02 10*3/uL (ref 0.00–0.07)
Basophils Absolute: 0 10*3/uL (ref 0.0–0.1)
Basophils Relative: 0 %
Eosinophils Absolute: 0 10*3/uL (ref 0.0–0.5)
Eosinophils Relative: 1 %
HCT: 39.1 % (ref 39.0–52.0)
Hemoglobin: 12.4 g/dL — ABNORMAL LOW (ref 13.0–17.0)
Immature Granulocytes: 0 %
Lymphocytes Relative: 43 %
Lymphs Abs: 2.8 10*3/uL (ref 0.7–4.0)
MCH: 30.4 pg (ref 26.0–34.0)
MCHC: 31.7 g/dL (ref 30.0–36.0)
MCV: 95.8 fL (ref 80.0–100.0)
Monocytes Absolute: 0.4 10*3/uL (ref 0.1–1.0)
Monocytes Relative: 7 %
Neutro Abs: 3.2 10*3/uL (ref 1.7–7.7)
Neutrophils Relative %: 49 %
Platelets: 124 10*3/uL — ABNORMAL LOW (ref 150–400)
RBC: 4.08 MIL/uL — ABNORMAL LOW (ref 4.22–5.81)
RDW: 15.1 % (ref 11.5–15.5)
WBC: 6.5 10*3/uL (ref 4.0–10.5)
nRBC: 0 % (ref 0.0–0.2)

## 2019-09-06 LAB — LIPASE, BLOOD: Lipase: 49 U/L (ref 11–51)

## 2019-09-06 LAB — COMPREHENSIVE METABOLIC PANEL
ALT: 13 U/L (ref 0–44)
AST: 21 U/L (ref 15–41)
Albumin: 3.3 g/dL — ABNORMAL LOW (ref 3.5–5.0)
Alkaline Phosphatase: 32 U/L — ABNORMAL LOW (ref 38–126)
Anion gap: 11 (ref 5–15)
BUN: 8 mg/dL (ref 8–23)
CO2: 23 mmol/L (ref 22–32)
Calcium: 9.1 mg/dL (ref 8.9–10.3)
Chloride: 108 mmol/L (ref 98–111)
Creatinine, Ser: 1.4 mg/dL — ABNORMAL HIGH (ref 0.61–1.24)
GFR calc Af Amer: 56 mL/min — ABNORMAL LOW (ref >60)
GFR calc non Af Amer: 48 mL/min — ABNORMAL LOW (ref >60)
Glucose, Bld: 161 mg/dL — ABNORMAL HIGH (ref 70–99)
Potassium: 3.5 mmol/L (ref 3.5–5.1)
Sodium: 142 mmol/L (ref 135–145)
Total Bilirubin: 1 mg/dL (ref 0.3–1.2)
Total Protein: 6 g/dL — ABNORMAL LOW (ref 6.5–8.1)

## 2019-09-06 LAB — URINALYSIS, ROUTINE W REFLEX MICROSCOPIC
Bacteria, UA: NONE SEEN
Bilirubin Urine: NEGATIVE
Glucose, UA: NEGATIVE mg/dL
Ketones, ur: NEGATIVE mg/dL
Leukocytes,Ua: NEGATIVE
Nitrite: NEGATIVE
Protein, ur: NEGATIVE mg/dL
Specific Gravity, Urine: 1.016 (ref 1.005–1.030)
pH: 5 (ref 5.0–8.0)

## 2019-09-06 LAB — CBG MONITORING, ED: Glucose-Capillary: 140 mg/dL — ABNORMAL HIGH (ref 70–99)

## 2019-09-06 MED ORDER — SODIUM CHLORIDE 0.9 % IV BOLUS
500.0000 mL | Freq: Once | INTRAVENOUS | Status: AC
  Administered 2019-09-06: 500 mL via INTRAVENOUS

## 2019-09-06 NOTE — ED Notes (Signed)
[facility] called @ 1137-per Thurmond Butts, RN called by Levada Dy

## 2019-09-06 NOTE — ED Triage Notes (Signed)
Pt BIB [facility] from [facility]. Per staff, pt with possible seizures. Staff stated pt just wasn't acting normal. Pt GCS 10 upon EMS arrival. Pt more alert with EMS. Pt with hx of dementia. VSS. NAD.

## 2019-09-06 NOTE — ED Notes (Signed)
Pt discharge instructions reviewed with family prior to [facility] transporting pt. Pt discharged.

## 2019-09-06 NOTE — Discharge Instructions (Addendum)
Your laboratory results were within normal limits today.  There was a small amount of blood in your urine, you will need to have this rechecked by your primary care physician.  The CT of your head, chest x-ray was within normal limits. All results were called to your daughter [name].

## 2019-09-06 NOTE — ED Provider Notes (Signed)
[facility] EMERGENCY DEPARTMENT Provider Note   CSN: [id number] Arrival date & time:        History Chief Complaint  Patient presents with  . Seizures    Ernest Turner is a 79 y.o. male.  79 y.o male with a PMH of Prostate CA, Dementia presents via EMS from [place] facility after an alleged "seizure-like activity ", per EMS report.  Patient on arrival had a GCS of 10, became more responsive with EMS after arrival into the ED, responding to questions.  Level 5 caveat due to dementia.  The history is provided by medical records.       Past Medical History:  Diagnosis Date  . Arthritis   . BPH (benign prostatic hyperplasia)   . Depression   . ED (erectile dysfunction)   . Full dentures   . Heart murmur   . Hemorrhoids   . History of peptic ulcer   . History of radiation therapy to prostate 7800 cGy 40 sessions 04-11-2012 to 06-11-2012   EXTERNAL BEAM PELVIC AREA FOR PROSTATE CANCER  . History of urinary retention   . HOH (hard of hearing)   . Hyperlipidemia   . Hypertension   . Hypogonadism male   . Lower urinary tract symptoms (LUTS)   . Memory difficulty   . Memory loss   . Poor historian   . Prostate cancer [facility]) dx 12/26/11  urologist-  dr Gaynelle Arabian  oncologist-  dr Valere Dross   GOLD SEED IMPLANT  03-07-2012--- Adenocarcinoma,gleason=3+3=6.,& 3+4=7,PSA=15.84--  s/p  external beam radiation 04-11-2012 to 06-11-2012  . Wears glasses     Patient Active Problem List   Diagnosis Date Noted  . Dementia ([place]) 08/12/2015  . Nausea & vomiting 04/04/2012  . BPH (benign prostatic hyperplasia)   . Hypogonadism male   . Allergy   . Arthritis   . Depression   . GERD (gastroesophageal reflux disease)   . Hypercholesterolemia   . Heart murmur   . Ulcer   . ED (erectile dysfunction)   . Prostate cancer ([place]) 12/26/2011    Past Surgical History:  Procedure Laterality Date  . CIRCUMCISION  09-21-2006  . COLONOSCOPY    .  CYSTOSCOPY Right 10/16/2014   Procedure: CYSTO, RIGHT RETROGRADE COOK BALLOON DILATION RIGHT PROXIMAL URETHERAL STRICTURE;  Surgeon: Carolan Clines, MD;  Location: [place];  Service: Urology;  Laterality: Right;  . INGUINAL HERNIA REPAIR Left 04/29/2014   Procedure: LEFT INGUINAL HERNIA REPAIR;  Surgeon: Coralie Keens, MD;  Location: [place];  Service: General;  Laterality: Left;  . INSERTION OF MESH Left 04/29/2014   Procedure: INSERTION OF MESH;  Surgeon: Coralie Keens, MD;  Location: [place];  Service: General;  Laterality: Left;  . MOUTH SURGERY     gum surgery  . MULTIPLE TOOTH EXTRACTIONS    . PROSTATE BIOPSY  12/26/11   PSA=15.84,gleason=3+3=6,& 3+4=7,Volume=27.98cc,Adenocarcinoma       Family History  Problem Relation Age of Onset  . Cancer Sister        liver to brain mets died late 68's  . Cancer Daughter        breast cancer died age 81  . Diabetes Mother     Social History   Tobacco Use  . Smoking status: Former Smoker    Packs/day: 2.00    Years: 20.00    Pack years: 40.00    Types: Cigarettes    Quit date: 01/21/1984    Years since quitting: 35.6  .  Smokeless tobacco: Never Used  Substance Use Topics  . Alcohol use: No  . Drug use: No    Home Medications Prior to Admission medications   Medication Sig Start Date End Date Taking? Authorizing Provider  diclofenac sodium (VOLTAREN) 1 % GEL Apply 4 g topically 4 (four) times daily. Patient not taking: Reported on 07/31/2019 08/12/15   Marcial Pacas, MD  donepezil (ARICEPT) 10 MG tablet Take 1 tablet (10 mg total) by mouth every evening. Patient not taking: Reported on 07/31/2019 08/12/15   Marcial Pacas, MD  memantine (NAMENDA) 10 MG tablet Take 1 tablet (10 mg total) by mouth 2 (two) times daily. Patient not taking: Reported on 07/31/2019 08/12/15   Marcial Pacas, MD  phenazopyridine (PYRIDIUM) 200 MG tablet Take 1 tablet (200 mg total) by mouth 3 (three) times daily as needed for pain. Patient not  taking: Reported on 07/31/2019 10/16/14   Carolan Clines, MD  risperiDONE (RISPERDAL) 0.25 MG tablet Take 1 tablet (0.25 mg total) by mouth at bedtime. 08/02/19   Blanchie Dessert, MD  tamsulosin (FLOMAX) 0.4 MG CAPS capsule Take 1 capsule (0.4 mg total) by mouth daily after breakfast. Take tamsulosin only if having difficulty voiding. Patient not taking: Reported on 07/31/2019 10/16/14   Carolan Clines, MD  triamterene-hydrochlorothiazide (MAXZIDE-25) 37.5-25 MG tablet Take 1 tablet by mouth in the morning. 05/21/19   [provider]    Allergies    Nutritional supplements, Androgel [testosterone], and Viagra [sildenafil citrate]  Review of Systems   Review of Systems  Unable to perform ROS: Dementia    Physical Exam Updated Vital Signs BP (!) 142/105   Pulse 60   Temp (!) 96.3 F (35.7 C) (Rectal)   Resp 18   SpO2 96%   Physical Exam Vitals and nursing note reviewed.  HENT:     Head: Normocephalic and atraumatic.     Mouth/Throat:     Mouth: Mucous membranes are dry.  Eyes:     Pupils: Pupils are equal, round, and reactive to light.  Cardiovascular:     Rate and Rhythm: Bradycardia present.     Pulses:          Dorsalis pedis pulses are 2+ on the right side and 2+ on the left side.     Comments: No bilateral pitting edema noted. Pulmonary:     Effort: Pulmonary effort is normal.     Breath sounds: No wheezing or rales.     Comments: Breath sounds are slightly diminished throughout. Abdominal:     General: Abdomen is flat.     Tenderness: There is abdominal tenderness.     Comments: Response to pain with pushing of his abdomen, without focal point of tenderness.  Musculoskeletal:     Cervical back: Normal range of motion and neck supple. No tenderness.  Skin:    General: Skin is warm and dry.     Findings: No erythema.  Neurological:     Mental Status: He is disoriented.     Comments: Alert to place.  Decrease strength to BLUE.      ED Results /  Procedures / Treatments   Labs (all labs ordered are listed, but only abnormal results are displayed) Labs Reviewed  CBC WITH DIFFERENTIAL/PLATELET - Abnormal; Notable for the following components:      Result Value   RBC 4.08 (*)    Hemoglobin 12.4 (*)    Platelets 124 (*)    All other components within normal limits  COMPREHENSIVE METABOLIC PANEL -  Abnormal; Notable for the following components:   Glucose, Bld 161 (*)    Creatinine, Ser 1.40 (*)    Total Protein 6.0 (*)    Albumin 3.3 (*)    Alkaline Phosphatase 32 (*)    GFR calc non Af Amer 48 (*)    GFR calc Af Amer 56 (*)    All other components within normal limits  URINALYSIS, ROUTINE W REFLEX MICROSCOPIC - Abnormal; Notable for the following components:   Hgb urine dipstick SMALL (*)    All other components within normal limits  CBG MONITORING, ED - Abnormal; Notable for the following components:   Glucose-Capillary 140 (*)    All other components within normal limits  LIPASE, BLOOD  RAPID URINE DRUG SCREEN, HOSP PERFORMED    EKG EKG Interpretation  Date/Time:  Friday September 06 2019 07:44:21 EDT Ventricular Rate:  60 PR Interval:    QRS Duration: 86 QT Interval:  558 QTC Calculation: 558 R Axis:   77 Text Interpretation: Sinus rhythm Atrial premature complexes Probable left atrial enlargement Nonspecific T abnrm, anterolateral leads Prolonged QT interval No significant change since last tracing Confirmed by Gareth Morgan [phone]) on 09/06/2019 8:36:32 AM   Radiology DG Chest 2 View  Result Date: 09/06/2019 CLINICAL DATA:  Altered mental status EXAM: CHEST - 2 VIEW COMPARISON:  07/31/2019, 05/28/2018 FINDINGS: Chronic elevation of the left hemidiaphragm with resultant rightward shift of the heart and mediastinal structures. Large hiatal hernia. Atelectatic changes within the left lung base. Stable cardiomegaly. No superimposed acute airspace opacity. No pleural effusion or pneumothorax. No new or acute osseous  findings. IMPRESSION: Chronic elevation of the left hemidiaphragm with associated left basilar atelectasis. Electronically Signed   By: Davina Poke D.O.   On: 09/06/2019 08:36   CT Head Wo Contrast  Result Date: 09/06/2019 CLINICAL DATA:  Altered mental status. EXAM: CT HEAD WITHOUT CONTRAST TECHNIQUE: Contiguous axial images were obtained from the base of the skull through the vertex without intravenous contrast. COMPARISON:  05/28/2018 FINDINGS: Brain: No evidence of acute infarction, hemorrhage, hydrocephalus, extra-axial collection, or mass lesion/mass effect. Moderate cerebral atrophy and mild chronic small vessel disease remain stable. Vascular:  No hyperdense vessel or other acute findings. Skull: No evidence of fracture or other significant bone abnormality. Sinuses/Orbits:  No acute findings. Other: None. IMPRESSION: 1. No acute intracranial abnormality. 2. Stable cerebral atrophy and chronic small vessel disease. Electronically Signed   By: Marlaine Hind M.D.   On: 09/06/2019 08:56    Procedures Procedures (including critical care time)  Medications Ordered in ED Medications  sodium chloride 0.9 % bolus 500 mL (500 mLs Intravenous New Bag/Given 09/06/19 5027)    ED Course  I have reviewed the triage vital signs and the nursing notes.  Pertinent labs & imaging results that were available during my care of the patient were reviewed by me and considered in my medical decision making (see chart for details).  Clinical Course as of Sep 05 1105  Fri Sep 06, 2019  7412 Provided with 578ml bolus  Creatinine(!): 1.40 [JS]    Clinical Course User Index [JS] Janeece Fitting, PA-C   MDM Rules/Calculators/A&P    Patient with a past medical history of dementia, prostate cancer presents to the ED via EMS from [place] after "seizure-like activity ", last seen normal approximately 6:30 AM this morning.  Unknown what the seizure act like activity entailed.  He was found by EMS with a  GCS of 10, they report he became more  alert upon transportation.  No medication was received.  CBG on arrival ranged around the 150s.  During primary evaluation patient appears altered, he is alerted to self.  Reports pain upon palpation of his abdomen, breath sounds are slightly diminished to all lung fields.  Vitals without any signs of hypotension, bradycardia is noted.  8:03 AM Call placed to [facility] at [phone]. York Cerise RN "they said shaking, sweating, eye roll to the back of his head, lasted for a couple of second, whole left side was tightening, arms were straight and couldn't move it same as his legs.". No blood thinners, no medication.   Patient has been, responsive during his stay in the ED.  Screening labs were obtained.  A CT head was also ordered as he has no prior history of seizures and this seizure-like activity was reported by nursing staff at [facility].  Interpretation of his labs revealed a CMP without any electrolyte derangement, slight elevation in his creatinine, he was provided with a 500 mL bolus, no prior history of heart failure.  LFTs are unremarkable.  Lipase level is within normal limits.  CBC without any leukocytosis, slight decrease in his hemoglobin.  Interpretation and review of his chest x-ray shows a chronic left hemidiaphragm.  9:02 AM spoke to daughter [name] at the bedside who was informed of results of laboratory and chest xray, pending UA.  CT head results showed: 1. No acute intracranial abnormality.  2. Stable cerebral atrophy and chronic small vessel disease.     Urinalysis with small amount of hemoglobin, no bacteria, no nitrites.  Patient has not had any seizure-like activity while in the ED.  11:03 AM Spoke to daughter [name], she was informed of the results.  Patient's daughter does report he is currently on blood pressure medication, I see none listed on his chart, his last blood pressure reading is somewhat elevated but without any signs  of pretense of urgency.  We discussed having patient receive medication while at the facility.  He is otherwise back to his baseline, in stable condition.  No signs of CVA, no signs of UTI.    Patient stable for discharge at this time, in stable condition.    Portions of this note were generated with [employer]. Dictation errors may occur despite best attempts at proofreading.  Final Clinical Impression(s) / ED Diagnoses Final diagnoses:  Altered mental status, unspecified altered mental status type    Rx / DC Orders ED Discharge Orders    None       Janeece Fitting, PA-C 09/06/19 1117    Gareth Morgan, MD 09/06/19 2208

## 2019-09-09 DIAGNOSIS — G301 Alzheimer's disease with late onset: Secondary | ICD-10-CM | POA: Diagnosis not present

## 2019-09-09 DIAGNOSIS — Z79899 Other long term (current) drug therapy: Secondary | ICD-10-CM | POA: Diagnosis not present

## 2019-10-03 DIAGNOSIS — R2681 Unsteadiness on feet: Secondary | ICD-10-CM | POA: Diagnosis not present

## 2019-10-03 DIAGNOSIS — M6281 Muscle weakness (generalized): Secondary | ICD-10-CM | POA: Diagnosis not present

## 2019-10-07 DIAGNOSIS — M6281 Muscle weakness (generalized): Secondary | ICD-10-CM | POA: Diagnosis not present

## 2019-10-07 DIAGNOSIS — R2681 Unsteadiness on feet: Secondary | ICD-10-CM | POA: Diagnosis not present

## 2019-10-11 DIAGNOSIS — R2681 Unsteadiness on feet: Secondary | ICD-10-CM | POA: Diagnosis not present

## 2019-10-11 DIAGNOSIS — M6281 Muscle weakness (generalized): Secondary | ICD-10-CM | POA: Diagnosis not present

## 2019-10-14 DIAGNOSIS — R278 Other lack of coordination: Secondary | ICD-10-CM | POA: Diagnosis not present

## 2019-10-14 DIAGNOSIS — R2681 Unsteadiness on feet: Secondary | ICD-10-CM | POA: Diagnosis not present

## 2019-10-14 DIAGNOSIS — R293 Abnormal posture: Secondary | ICD-10-CM | POA: Diagnosis not present

## 2019-10-14 DIAGNOSIS — M6281 Muscle weakness (generalized): Secondary | ICD-10-CM | POA: Diagnosis not present

## 2019-10-18 DIAGNOSIS — R293 Abnormal posture: Secondary | ICD-10-CM | POA: Diagnosis not present

## 2019-10-18 DIAGNOSIS — R2681 Unsteadiness on feet: Secondary | ICD-10-CM | POA: Diagnosis not present

## 2019-10-18 DIAGNOSIS — M6281 Muscle weakness (generalized): Secondary | ICD-10-CM | POA: Diagnosis not present

## 2019-10-18 DIAGNOSIS — R278 Other lack of coordination: Secondary | ICD-10-CM | POA: Diagnosis not present

## 2019-10-20 ENCOUNTER — Emergency Department (HOSPITAL_COMMUNITY): Payer: Medicare Other

## 2019-10-20 ENCOUNTER — Other Ambulatory Visit: Payer: Self-pay

## 2019-10-20 ENCOUNTER — Observation Stay (HOSPITAL_COMMUNITY)
Admission: EM | Admit: 2019-10-20 | Discharge: 2019-10-23 | Disposition: A | Discharge status: Home / Self Care | Payer: Medicare Other | Attending: Family Medicine | Admitting: Family Medicine

## 2019-10-20 DIAGNOSIS — Z8546 Personal history of malignant neoplasm of prostate: Secondary | ICD-10-CM | POA: Insufficient documentation

## 2019-10-20 DIAGNOSIS — Z20822 Contact with and (suspected) exposure to covid-19: Secondary | ICD-10-CM | POA: Diagnosis not present

## 2019-10-20 DIAGNOSIS — N400 Enlarged prostate without lower urinary tract symptoms: Secondary | ICD-10-CM | POA: Diagnosis present

## 2019-10-20 DIAGNOSIS — I10 Essential (primary) hypertension: Secondary | ICD-10-CM | POA: Diagnosis present

## 2019-10-20 DIAGNOSIS — B9689 Other specified bacterial agents as the cause of diseases classified elsewhere: Secondary | ICD-10-CM | POA: Diagnosis present

## 2019-10-20 DIAGNOSIS — J189 Pneumonia, unspecified organism: Secondary | ICD-10-CM | POA: Diagnosis present

## 2019-10-20 DIAGNOSIS — R531 Weakness: Secondary | ICD-10-CM | POA: Diagnosis not present

## 2019-10-20 DIAGNOSIS — Z79899 Other long term (current) drug therapy: Secondary | ICD-10-CM | POA: Diagnosis not present

## 2019-10-20 DIAGNOSIS — R05 Cough: Secondary | ICD-10-CM | POA: Diagnosis not present

## 2019-10-20 DIAGNOSIS — R410 Disorientation, unspecified: Secondary | ICD-10-CM | POA: Insufficient documentation

## 2019-10-20 DIAGNOSIS — Z87891 Personal history of nicotine dependence: Secondary | ICD-10-CM | POA: Insufficient documentation

## 2019-10-20 DIAGNOSIS — J918 Pleural effusion in other conditions classified elsewhere: Secondary | ICD-10-CM | POA: Diagnosis not present

## 2019-10-20 DIAGNOSIS — F0390 Unspecified dementia without behavioral disturbance: Secondary | ICD-10-CM | POA: Diagnosis not present

## 2019-10-20 DIAGNOSIS — R2681 Unsteadiness on feet: Secondary | ICD-10-CM | POA: Diagnosis not present

## 2019-10-20 DIAGNOSIS — J90 Pleural effusion, not elsewhere classified: Secondary | ICD-10-CM | POA: Diagnosis not present

## 2019-10-20 DIAGNOSIS — Z743 Need for continuous supervision: Secondary | ICD-10-CM | POA: Diagnosis not present

## 2019-10-20 DIAGNOSIS — M6281 Muscle weakness (generalized): Secondary | ICD-10-CM | POA: Insufficient documentation

## 2019-10-20 DIAGNOSIS — J168 Pneumonia due to other specified infectious organisms: Secondary | ICD-10-CM | POA: Diagnosis not present

## 2019-10-20 DIAGNOSIS — H109 Unspecified conjunctivitis: Secondary | ICD-10-CM | POA: Diagnosis present

## 2019-10-20 DIAGNOSIS — E872 Acidosis, unspecified: Secondary | ICD-10-CM | POA: Diagnosis present

## 2019-10-20 DIAGNOSIS — K228 Other specified diseases of esophagus: Secondary | ICD-10-CM | POA: Diagnosis not present

## 2019-10-20 DIAGNOSIS — G9341 Metabolic encephalopathy: Secondary | ICD-10-CM | POA: Diagnosis present

## 2019-10-20 LAB — COMPREHENSIVE METABOLIC PANEL
ALT: 18 U/L (ref 0–44)
AST: 34 U/L (ref 15–41)
Albumin: 3.7 g/dL (ref 3.5–5.0)
Alkaline Phosphatase: 63 U/L (ref 38–126)
Anion gap: 11 (ref 5–15)
BUN: 14 mg/dL (ref 8–23)
CO2: 24 mmol/L (ref 22–32)
Calcium: 9.7 mg/dL (ref 8.9–10.3)
Chloride: 105 mmol/L (ref 98–111)
Creatinine, Ser: 1.34 mg/dL — ABNORMAL HIGH (ref 0.61–1.24)
GFR calc Af Amer: 59 mL/min — ABNORMAL LOW (ref >60)
GFR calc non Af Amer: 51 mL/min — ABNORMAL LOW (ref >60)
Glucose, Bld: 118 mg/dL — ABNORMAL HIGH (ref 70–99)
Potassium: 4.3 mmol/L (ref 3.5–5.1)
Sodium: 140 mmol/L (ref 135–145)
Total Bilirubin: 1.5 mg/dL — ABNORMAL HIGH (ref 0.3–1.2)
Total Protein: 7 g/dL (ref 6.5–8.1)

## 2019-10-20 LAB — CBC WITH DIFFERENTIAL/PLATELET
Abs Immature Granulocytes: 0.03 10*3/uL (ref 0.00–0.07)
Basophils Absolute: 0 10*3/uL (ref 0.0–0.1)
Basophils Relative: 0 %
Eosinophils Absolute: 0 10*3/uL (ref 0.0–0.5)
Eosinophils Relative: 0 %
HCT: 44.4 % (ref 39.0–52.0)
Hemoglobin: 14.2 g/dL (ref 13.0–17.0)
Immature Granulocytes: 0 %
Lymphocytes Relative: 16 %
Lymphs Abs: 1.5 10*3/uL (ref 0.7–4.0)
MCH: 30.5 pg (ref 26.0–34.0)
MCHC: 32 g/dL (ref 30.0–36.0)
MCV: 95.5 fL (ref 80.0–100.0)
Monocytes Absolute: 0.8 10*3/uL (ref 0.1–1.0)
Monocytes Relative: 8 %
Neutro Abs: 7.1 10*3/uL (ref 1.7–7.7)
Neutrophils Relative %: 76 %
Platelets: 145 10*3/uL — ABNORMAL LOW (ref 150–400)
RBC: 4.65 MIL/uL (ref 4.22–5.81)
RDW: 15.2 % (ref 11.5–15.5)
WBC: 9.4 10*3/uL (ref 4.0–10.5)
nRBC: 0 % (ref 0.0–0.2)

## 2019-10-20 LAB — SARS CORONAVIRUS 2 BY RT PCR (HOSPITAL ORDER, PERFORMED IN ~~LOC~~ HOSPITAL LAB): SARS Coronavirus 2: NEGATIVE

## 2019-10-20 LAB — LACTIC ACID, PLASMA: Lactic Acid, Venous: 2 mmol/L (ref 0.5–1.9)

## 2019-10-20 MED ORDER — SODIUM CHLORIDE 0.9 % IV SOLN
2.0000 g | Freq: Once | INTRAVENOUS | Status: AC
  Administered 2019-10-20: 2 g via INTRAVENOUS
  Filled 2019-10-20: qty 20

## 2019-10-20 MED ORDER — SODIUM CHLORIDE 0.9 % IV SOLN
500.0000 mg | INTRAVENOUS | Status: DC
  Administered 2019-10-20: 500 mg via INTRAVENOUS
  Filled 2019-10-20: qty 500

## 2019-10-20 MED ORDER — DOXYCYCLINE HYCLATE 100 MG PO TABS: 100.0000 mg | ORAL_TABLET | Freq: Once | ORAL | Status: DC

## 2019-10-20 NOTE — ED Triage Notes (Signed)
PT from [place]. Staff reported Pt has a productive cough. EMS temp 100.2 . Staff reported  Several pt. At SNF have PNA.  Pt at base line with dementia .  On arrival to room Pt observedd to have drainage to bil eyes.

## 2019-10-20 NOTE — ED Provider Notes (Signed)
[facility] EMERGENCY DEPARTMENT Provider Note   CSN: [id number] Arrival date & time: 10/20/19  1900     History Chief Complaint  Patient presents with  . Cough    Ernest Turner is a 79 y.o. male with history of Alzheimer's, hypertension brought to the ED from [place] for evaluation of cough.  This was noted by staff today.  Reportedly productive.  Low-grade fever 100.2 orally by EMS on route.  Per report, several patients SNF have pneumonia.  Otherwise patient reportedly at baseline with history of dementia.  Level 5 caveat due to dementia.   HPI     Past Medical History:  Diagnosis Date  . Arthritis   . BPH (benign prostatic hyperplasia)   . Depression   . ED (erectile dysfunction)   . Full dentures   . Heart murmur   . Hemorrhoids   . History of peptic ulcer   . History of radiation therapy to prostate 7800 cGy 40 sessions 04-11-2012 to 06-11-2012   EXTERNAL BEAM PELVIC AREA FOR PROSTATE CANCER  . History of urinary retention   . HOH (hard of hearing)   . Hyperlipidemia   . Hypertension   . Hypogonadism male   . Lower urinary tract symptoms (LUTS)   . Memory difficulty   . Memory loss   . Poor historian   . Prostate cancer [facility]) dx 12/26/11  urologist-  dr Gaynelle Arabian  oncologist-  dr Valere Dross   GOLD SEED IMPLANT  03-07-2012--- Adenocarcinoma,gleason=3+3=6.,& 3+4=7,PSA=15.84--  s/p  external beam radiation 04-11-2012 to 06-11-2012  . Wears glasses     Patient Active Problem List   Diagnosis Date Noted  . Dementia ([place]) 08/12/2015  . Nausea & vomiting 04/04/2012  . BPH (benign prostatic hyperplasia)   . Hypogonadism male   . Allergy   . Arthritis   . Depression   . GERD (gastroesophageal reflux disease)   . Hypercholesterolemia   . Heart murmur   . Ulcer   . ED (erectile dysfunction)   . Prostate cancer ([place]) 12/26/2011    Past Surgical History:  Procedure Laterality Date  . CIRCUMCISION  09-21-2006  . COLONOSCOPY    .  CYSTOSCOPY Right 10/16/2014   Procedure: CYSTO, RIGHT RETROGRADE COOK BALLOON DILATION RIGHT PROXIMAL URETHERAL STRICTURE;  Surgeon: Carolan Clines, MD;  Location: [place];  Service: Urology;  Laterality: Right;  . INGUINAL HERNIA REPAIR Left 04/29/2014   Procedure: LEFT INGUINAL HERNIA REPAIR;  Surgeon: Coralie Keens, MD;  Location: [place];  Service: General;  Laterality: Left;  . INSERTION OF MESH Left 04/29/2014   Procedure: INSERTION OF MESH;  Surgeon: Coralie Keens, MD;  Location: [place];  Service: General;  Laterality: Left;  . MOUTH SURGERY     gum surgery  . MULTIPLE TOOTH EXTRACTIONS    . PROSTATE BIOPSY  12/26/11   PSA=15.84,gleason=3+3=6,& 3+4=7,Volume=27.98cc,Adenocarcinoma       Family History  Problem Relation Age of Onset  . Cancer Sister        liver to brain mets died late 35's  . Cancer Daughter        breast cancer died age 91  . Diabetes Mother     Social History   Tobacco Use  . Smoking status: Former Smoker    Packs/day: 2.00    Years: 20.00    Pack years: 40.00    Types: Cigarettes    Quit date: 01/21/1984    Years since quitting: 35.7  . Smokeless tobacco: Never Used  Substance Use Topics  . Alcohol use: No  . Drug use: No    Home Medications Prior to Admission medications   Medication Sig Start Date End Date Taking? Authorizing Provider  diclofenac sodium (VOLTAREN) 1 % GEL Apply 4 g topically 4 (four) times daily. Patient not taking: Reported on 07/31/2019 08/12/15   Marcial Pacas, MD  donepezil (ARICEPT) 10 MG tablet Take 1 tablet (10 mg total) by mouth every evening. Patient not taking: Reported on 07/31/2019 08/12/15   Marcial Pacas, MD  memantine (NAMENDA) 10 MG tablet Take 1 tablet (10 mg total) by mouth 2 (two) times daily. Patient not taking: Reported on 07/31/2019 08/12/15   Marcial Pacas, MD  phenazopyridine (PYRIDIUM) 200 MG tablet Take 1 tablet (200 mg total) by mouth 3 (three) times daily as needed for pain. Patient not  taking: Reported on 07/31/2019 10/16/14   Carolan Clines, MD  risperiDONE (RISPERDAL) 0.25 MG tablet Take 1 tablet (0.25 mg total) by mouth at bedtime. 08/02/19   Blanchie Dessert, MD  tamsulosin (FLOMAX) 0.4 MG CAPS capsule Take 1 capsule (0.4 mg total) by mouth daily after breakfast. Take tamsulosin only if having difficulty voiding. Patient not taking: Reported on 07/31/2019 10/16/14   Carolan Clines, MD  triamterene-hydrochlorothiazide (MAXZIDE-25) 37.5-25 MG tablet Take 1 tablet by mouth in the morning. 05/21/19   [provider]    Allergies    Nutritional supplements, Androgel [testosterone], and Viagra [sildenafil citrate]  Review of Systems   Review of Systems  Unable to perform ROS: Dementia  Constitutional: Positive for fever.  Respiratory: Positive for cough.   Psychiatric/Behavioral: Positive for confusion.  All other systems reviewed and are negative.   Physical Exam Updated Vital Signs BP (!) 131/69   Pulse 74   Temp 100.1 F (37.8 C) (Rectal)   Resp 18   SpO2 97%   Physical Exam Vitals and nursing note reviewed.  Constitutional:      General: He is not in acute distress.    Appearance: He is well-developed.     Comments: Nontoxic but appears tired.  Snoring.  Does not wake up with verbal stimuli, arouses with shoulder rub.  HENT:     Head: Normocephalic and atraumatic.     Right Ear: External ear normal.     Left Ear: External ear normal.     Nose: Nose normal.     Mouth/Throat:     Mouth: Mucous membranes are dry.     Comments: Dry lips, moist mucous membranes.  Patient does not open up his mouth to evaluate oropharynx. Eyes:     General: No scleral icterus.    Conjunctiva/sclera: Conjunctivae normal.  Cardiovascular:     Rate and Rhythm: Normal rate and regular rhythm.     Heart sounds: Normal heart sounds.     Comments: No peripheral edema. Pulmonary:     Effort: Pulmonary effort is normal.     Breath sounds: Rhonchi present.      Comments: SPO2 greater than 95% on room air.  No obvious increased work of breathing or respiratory distress.  Diffuse rhonchi bilaterally, diminished air sounds in bilateral lower lobes.  Difficult exam, limited due to patient positioning. Abdominal:     Palpations: Abdomen is soft.     Tenderness: There is no abdominal tenderness.  Musculoskeletal:        General: Normal range of motion.     Cervical back: Normal range of motion and neck supple.  Skin:    General: Skin is warm  and dry.     Capillary Refill: Capillary refill takes less than 2 seconds.  Neurological:     Mental Status: He is alert. He is disoriented.     Comments: Patient nods his head when asked if he is in pain but cannot point to any areas.  He can wiggle his toes but cannot lift his legs off the bed.  Attempts to squeeze grip bilaterally but diminished strength bilaterally.  Does not follow any other commands.     ED Results / Procedures / Treatments   Labs (all labs ordered are listed, but only abnormal results are displayed) Labs Reviewed  CBC WITH DIFFERENTIAL/PLATELET - Abnormal; Notable for the following components:      Result Value   Platelets 145 (*)    All other components within normal limits  LACTIC ACID, PLASMA - Abnormal; Notable for the following components:   Lactic Acid, Venous 2.0 (*)    All other components within normal limits  COMPREHENSIVE METABOLIC PANEL - Abnormal; Notable for the following components:   Glucose, Bld 118 (*)    Creatinine, Ser 1.34 (*)    Total Bilirubin 1.5 (*)    GFR calc non Af Amer 51 (*)    GFR calc Af Amer 59 (*)    All other components within normal limits  SARS CORONAVIRUS 2 BY RT PCR (HOSPITAL ORDER, [facility])  LACTIC ACID, PLASMA    EKG EKG Interpretation  Date/Time:  Sunday October 20 2019 20:03:20 EDT Ventricular Rate:  78 PR Interval:    QRS Duration: 80 QT Interval:  414 QTC Calculation: 472 R Axis:   64 Text  Interpretation: Sinus rhythm Probable left atrial enlargement Low voltage, precordial leads Borderline repolarization abnormality No acute changes No significant change since last tracing Confirmed by Varney Biles [phone]) on 10/20/2019 9:04:40 PM   Radiology DG Chest Portable 1 View  Result Date: 10/20/2019 CLINICAL DATA:  Fever and shortness of breath EXAM: PORTABLE CHEST 1 VIEW COMPARISON:  September 06, 2019 FINDINGS: The heart size and mediastinal contours are unchanged with mild cardiomegaly. There is stable elevation of the left hemidiaphragm. A probable loculated small left basilar effusion is seen with airspace opacity. The right lung is clear. IMPRESSION: Elevation of the left hemidiaphragm with a small left pleural effusion and basilar opacity which could be atelectasis and/or layering effusion. Electronically Signed   By: Prudencio Pair M.D.   On: 10/20/2019 20:24    Procedures Procedures (including critical care time)  Medications Ordered in ED Medications  azithromycin (ZITHROMAX) 500 mg in sodium chloride 0.9 % 250 mL IVPB (0 mg Intravenous Stopped 10/20/19 2321)  cefTRIAXone (ROCEPHIN) 2 g in sodium chloride 0.9 % 100 mL IVPB (0 g Intravenous Stopped 10/20/19 2320)    ED Course  I have reviewed the triage vital signs and the nursing notes.  Pertinent labs & imaging results that were available during my care of the patient were reviewed by me and considered in my medical decision making (see chart for details).  Clinical Course as of Oct 20 2350  Nancy Fetter Oct 20, 2019  2054 Temp: 100.1 F (37.8 C) [CG]  2055 WBC: 9.4 [CG]  2055 Hemoglobin: 14.2 [CG]  2055 FINDINGS: The heart size and mediastinal contours are unchanged with mild cardiomegaly. There is stable elevation of the left hemidiaphragm. A probable loculated small left basilar effusion is seen with airspace opacity. The right lung is clear.     DG Chest Portable 1 View [  CG]  2151 Spoke to staff at [facility].  They do  not seem to be too familiar with symptoms that prompted 911 call.  Staff tells me that they were told that he was "not feeling good" and he had a cough.  They confirmed he is a full code.  They confirmed emergency contact is [name], daughter.  They state patient has a normal diet and takes oral pills.  They should be able to give him oral antibiotics if discharged from the ED.   [CG]    Clinical Course User Index [CG] Arlean Hopping   MDM Rules/Calculators/A&P                          79 year old gentleman presents for worsening confusion, cough and low-grade fever.  Resident of [facility].  Level 5 caveat due to underlying dementia.  I obtained additional history from staff at [facility].  See above. They state patient has been more confused at home, wet cough. Confirmed full code status.   I have ordered labs, CXR, EKG, COVID swab.   ER work up personally interpreted.   CXR shows pleural effusion, opacity. He has rhonchi on exam, oral temp 100.1. Normal SpO2.  No leukocytosis. Lactic acid 2.0.  HD stable. Stable creatinine.  COVID negative.  Attempted to give patient oral antibiotics here in anticipation of discharge back to [place] but he is confused and not opening mouth.  Called [facility] staff they state patient always refuses oral meds.  Patient discussed with EDP. Given failed PO challenge, acute confusion unable to discharge.    Discussed with hospitalist Dr Cyd Silence who will admit patient. Appreciate his assistance.   Final Clinical Impression(s) / ED Diagnoses Final diagnoses:  Confusion  Pneumonia of left lower lobe due to infectious organism    Rx / DC Orders ED Discharge Orders    None       Kinnie Feil, PA-C 10/20/19 [place], Ankit, MD 10/21/19 0010

## 2019-10-21 ENCOUNTER — Observation Stay (HOSPITAL_COMMUNITY): Payer: Medicare Other

## 2019-10-21 ENCOUNTER — Encounter (HOSPITAL_COMMUNITY): Payer: Self-pay | Admitting: Internal Medicine

## 2019-10-21 DIAGNOSIS — N400 Enlarged prostate without lower urinary tract symptoms: Secondary | ICD-10-CM

## 2019-10-21 DIAGNOSIS — J168 Pneumonia due to other specified infectious organisms: Secondary | ICD-10-CM | POA: Diagnosis not present

## 2019-10-21 DIAGNOSIS — R410 Disorientation, unspecified: Secondary | ICD-10-CM | POA: Diagnosis not present

## 2019-10-21 DIAGNOSIS — E872 Acidosis, unspecified: Secondary | ICD-10-CM | POA: Diagnosis present

## 2019-10-21 DIAGNOSIS — G9341 Metabolic encephalopathy: Secondary | ICD-10-CM

## 2019-10-21 DIAGNOSIS — J90 Pleural effusion, not elsewhere classified: Secondary | ICD-10-CM | POA: Diagnosis not present

## 2019-10-21 DIAGNOSIS — J189 Pneumonia, unspecified organism: Secondary | ICD-10-CM | POA: Diagnosis not present

## 2019-10-21 DIAGNOSIS — J439 Emphysema, unspecified: Secondary | ICD-10-CM | POA: Diagnosis not present

## 2019-10-21 DIAGNOSIS — H109 Unspecified conjunctivitis: Secondary | ICD-10-CM

## 2019-10-21 DIAGNOSIS — I10 Essential (primary) hypertension: Secondary | ICD-10-CM | POA: Diagnosis not present

## 2019-10-21 DIAGNOSIS — J918 Pleural effusion in other conditions classified elsewhere: Secondary | ICD-10-CM

## 2019-10-21 LAB — CBC WITH DIFFERENTIAL/PLATELET
Abs Immature Granulocytes: 0.02 10*3/uL (ref 0.00–0.07)
Basophils Absolute: 0 10*3/uL (ref 0.0–0.1)
Basophils Relative: 0 %
Eosinophils Absolute: 0.1 10*3/uL (ref 0.0–0.5)
Eosinophils Relative: 1 %
HCT: 40.4 % (ref 39.0–52.0)
Hemoglobin: 12.8 g/dL — ABNORMAL LOW (ref 13.0–17.0)
Immature Granulocytes: 0 %
Lymphocytes Relative: 16 %
Lymphs Abs: 1.1 10*3/uL (ref 0.7–4.0)
MCH: 30 pg (ref 26.0–34.0)
MCHC: 31.7 g/dL (ref 30.0–36.0)
MCV: 94.6 fL (ref 80.0–100.0)
Monocytes Absolute: 0.7 10*3/uL (ref 0.1–1.0)
Monocytes Relative: 10 %
Neutro Abs: 4.8 10*3/uL (ref 1.7–7.7)
Neutrophils Relative %: 73 %
Platelets: ADEQUATE 10*3/uL (ref 150–400)
RBC: 4.27 MIL/uL (ref 4.22–5.81)
RDW: 15.1 % (ref 11.5–15.5)
WBC: 6.6 10*3/uL (ref 4.0–10.5)
nRBC: 0 % (ref 0.0–0.2)

## 2019-10-21 LAB — PROCALCITONIN: Procalcitonin: <0.1 ng/mL

## 2019-10-21 LAB — COMPREHENSIVE METABOLIC PANEL
ALT: 46 U/L — ABNORMAL HIGH (ref 0–44)
AST: 116 U/L — ABNORMAL HIGH (ref 15–41)
Albumin: 3.2 g/dL — ABNORMAL LOW (ref 3.5–5.0)
Alkaline Phosphatase: 66 U/L (ref 38–126)
Anion gap: 9 (ref 5–15)
BUN: 13 mg/dL (ref 8–23)
CO2: 25 mmol/L (ref 22–32)
Calcium: 9.1 mg/dL (ref 8.9–10.3)
Chloride: 105 mmol/L (ref 98–111)
Creatinine, Ser: 1.11 mg/dL (ref 0.61–1.24)
GFR calc Af Amer: >60 mL/min (ref >60)
GFR calc non Af Amer: >60 mL/min (ref >60)
Glucose, Bld: 110 mg/dL — ABNORMAL HIGH (ref 70–99)
Potassium: 3.9 mmol/L (ref 3.5–5.1)
Sodium: 139 mmol/L (ref 135–145)
Total Bilirubin: 1.4 mg/dL — ABNORMAL HIGH (ref 0.3–1.2)
Total Protein: 6.5 g/dL (ref 6.5–8.1)

## 2019-10-21 LAB — LACTIC ACID, PLASMA
Lactic Acid, Venous: 1.4 mmol/L (ref 0.5–1.9)
Lactic Acid, Venous: 1.5 mmol/L (ref 0.5–1.9)

## 2019-10-21 LAB — FOLATE: Folate: 15 ng/mL (ref >5.9)

## 2019-10-21 LAB — VITAMIN B12: Vitamin B-12: 149 pg/mL — ABNORMAL LOW (ref 180–914)

## 2019-10-21 LAB — TSH: TSH: 0.439 u[IU]/mL (ref 0.350–4.500)

## 2019-10-21 LAB — HIV ANTIBODY (ROUTINE TESTING W REFLEX): HIV Screen 4th Generation wRfx: NONREACTIVE

## 2019-10-21 LAB — C-REACTIVE PROTEIN: CRP: 1.9 mg/dL — ABNORMAL HIGH (ref <1.0)

## 2019-10-21 MED ORDER — ONDANSETRON HCL 4 MG/2ML IJ SOLN: 4.0000 mg | Freq: Four times a day (QID) | INTRAMUSCULAR | Status: DC | PRN

## 2019-10-21 MED ORDER — HALOPERIDOL 1 MG PO TABS
2.0000 mg | ORAL_TABLET | Freq: Two times a day (BID) | ORAL | Status: DC
  Administered 2019-10-21 – 2019-10-23 (×5): 2 mg via ORAL
  Filled 2019-10-21 (×5): qty 2

## 2019-10-21 MED ORDER — VITAMIN B-12 1000 MCG PO TABS
1000.0000 ug | ORAL_TABLET | Freq: Every day | ORAL | Status: DC
  Administered 2019-10-22 – 2019-10-23 (×2): 1000 ug via ORAL
  Filled 2019-10-21 (×2): qty 1

## 2019-10-21 MED ORDER — ALBUTEROL SULFATE (2.5 MG/3ML) 0.083% IN NEBU: 2.5000 mg | INHALATION_SOLUTION | RESPIRATORY_TRACT | Status: DC | PRN

## 2019-10-21 MED ORDER — HYDRALAZINE HCL 20 MG/ML IJ SOLN: 10.0000 mg | Freq: Four times a day (QID) | INTRAMUSCULAR | Status: DC | PRN

## 2019-10-21 MED ORDER — AMLODIPINE BESYLATE 10 MG PO TABS
10.0000 mg | ORAL_TABLET | Freq: Every day | ORAL | Status: DC
  Administered 2019-10-21 – 2019-10-23 (×3): 10 mg via ORAL
  Filled 2019-10-21 (×2): qty 1
  Filled 2019-10-21: qty 2

## 2019-10-21 MED ORDER — CYANOCOBALAMIN 1000 MCG/ML IJ SOLN
1000.0000 ug | Freq: Once | INTRAMUSCULAR | Status: AC
  Administered 2019-10-21: 1000 ug via INTRAMUSCULAR
  Filled 2019-10-21: qty 1

## 2019-10-21 MED ORDER — BACITRACIN-NEOMYCIN-POLYMYXIN OINTMENT TUBE: 1.0000 "application " | TOPICAL_OINTMENT | CUTANEOUS | Status: DC | PRN

## 2019-10-21 MED ORDER — SODIUM CHLORIDE 0.9 % IV SOLN
500.0000 mg | INTRAVENOUS | Status: DC
  Administered 2019-10-21: 500 mg via INTRAVENOUS
  Filled 2019-10-21 (×2): qty 500

## 2019-10-21 MED ORDER — ENOXAPARIN SODIUM 40 MG/0.4ML ~~LOC~~ SOLN
40.0000 mg | Freq: Every day | SUBCUTANEOUS | Status: DC
  Administered 2019-10-21 – 2019-10-23 (×3): 40 mg via SUBCUTANEOUS
  Filled 2019-10-21 (×3): qty 0.4

## 2019-10-21 MED ORDER — SODIUM CHLORIDE 0.9 % IV SOLN
2.0000 g | INTRAVENOUS | Status: DC
  Administered 2019-10-21 – 2019-10-22 (×2): 2 g via INTRAVENOUS
  Filled 2019-10-21 (×3): qty 20

## 2019-10-21 MED ORDER — IOHEXOL 300 MG/ML  SOLN
75.0000 mL | Freq: Once | INTRAMUSCULAR | Status: AC | PRN
  Administered 2019-10-21: 75 mL via INTRAVENOUS

## 2019-10-21 MED ORDER — POLYETHYLENE GLYCOL 3350 17 G PO PACK: 17.0000 g | PACK | Freq: Every day | ORAL | Status: DC | PRN

## 2019-10-21 MED ORDER — BACID PO TABS
2.0000 | ORAL_TABLET | Freq: Three times a day (TID) | ORAL | Status: DC
  Administered 2019-10-21 – 2019-10-23 (×6): 2 via ORAL
  Filled 2019-10-21 (×9): qty 2

## 2019-10-21 MED ORDER — ACETAMINOPHEN 325 MG PO TABS: 650.0000 mg | ORAL_TABLET | ORAL | Status: DC | PRN

## 2019-10-21 MED ORDER — POLYMYXIN B-TRIMETHOPRIM 10000-0.1 UNIT/ML-% OP SOLN
1.0000 [drp] | Freq: Four times a day (QID) | OPHTHALMIC | Status: DC
  Administered 2019-10-21 – 2019-10-23 (×7): 1 [drp] via OPHTHALMIC
  Filled 2019-10-21: qty 10

## 2019-10-21 MED ORDER — LACTATED RINGERS IV BOLUS
500.0000 mL | Freq: Once | INTRAVENOUS | Status: AC
  Administered 2019-10-21: 500 mL via INTRAVENOUS

## 2019-10-21 MED ORDER — LACTATED RINGERS IV SOLN: INTRAVENOUS | Status: AC

## 2019-10-21 NOTE — ED Notes (Signed)
Pt is combative when attempting blood draws. Pt pulls away and will not sit still allowing staff to stick. MD Notified.

## 2019-10-21 NOTE — Progress Notes (Signed)
PROGRESS NOTE    Patient: Ernest Turner                            PCP: [facility]                    DOB: [birth year]            DOA: 10/20/2019 WVP:[id number]             DOS: 10/21/2019, 12:11 PM   LOS: 0 days   Date of Service: The patient was seen and examined on 10/21/2019  Subjective:   The patient was seen and examined this Am. Pleasantly confused, hemodynamically stable No major complaints No events overnight, nursing staff present at bedside , Cath in place  Brief Narrative:  Per HPI: Ernest Turner ia s 79 year old male with past medical history of advanced infection prostate cancer in the past (2014), current diagnosis of BPH, GERD, HTN,  Alzheimer's dementia who presented from [facility] skilled nursing facility for new onset of cough. ED: Chest x-ray revealed Tem of 100.1 F.  COVID-19; negative.  Chest x-ray has revealed left lower lobe pneumonia with parapneumonic effusion.  Patient is a known history of elevated left hemidiaphragm obscuring interpretation somewhat.  -Cultures were obtained patient was initiated on IV antibiotics of Rocephin and azithromycin,  10/21/2019 Patient remained hemodynamically stable, on IV antibiotics CT of the chest-reviewed consistent with pneumonia  Assessment & Plan:   Principal Problem:   Pneumonia of left lower lobe due to infectious organism Active Problems:   BPH (benign prostatic hyperplasia)   Acute metabolic encephalopathy   Parapneumonic effusion   Lactic acidosis   Bacterial conjunctivitis of both eyes   Essential hypertension   Pneumonia of left lower lobe due to infectious organism Patient was evaluated for early signs of SIRS/ SEPSIS Temp 100.1, pulse 56, RR 19, BP 121/72, satting 97% on room air, lactic acid 2.0, WBC 6.6 -Therefore sepsis was ruled out   On admission mild lethargic, confusion in setting of severe dementia >> back to baseline  Metabolic encephalopathy was noted in the  setting of pneumonia, dementia.  Chest x-ray revealing left lower lobe infiltrate with possible parapneumonic effusion.  CT of the chest reviewed: Consolidation greater on the left, esophageal dilatation, hepatic bile duct dilatation, emphysema--chronic changes were noted  Continuing IV of antibiotics of Rocephin and azithromycin  Continue gentle IV fluid hydration  We will follow the blood cultures try to obtain sputum culture   Supplemental oxygen for bouts of hypoxia  As needed bronchodilator therapy.  Active Problems:    Acute metabolic encephalopathy -in the setting of infection, severe dementia   Per my discussion with the daughter who is the patient's decision-maker, patient is typically pleasantly confused and "very bubbly." ...  Seems to be back at baseline  Metabolic encephalopathy with underlying dementia and infection -c  Patient is likely suffering from acute metabolic encephalopathy secondary to underlying pneumonia.  CT head; reviewed, chronic changes, no acute findings   TSH; nl 0.439, B12; 149 (low) folate  Monitoring for clinical improvement with intravenous antibiotic therapy and intravenous fluids.  Patient has been n.p.o., mentation has improved, will evaluate at bedside, likely to initiate p.o. with precaution    Parapneumonic effusion   CT abdomen pelvis   Mild Lactic acidosis   LA 2.0 >> 1.4, 1.5  No overt signs of sepsis  Continue IV fluid resuscitation, broad-spectrum antibiotics  Bacterial conjunctivitis of both eyes   Significant purulent crusting of both eyes noted on admission examination  Will place patient on a 7-day course of Polytrim eyedrops twice daily.    Essential hypertension   We will remain conservative, monitoring closely, reinitiating BP meds accordingly  Added Norvasc 5 mg daily  Continue as needed hydralazine    BPH (benign prostatic hyperplasia)  Patient has both a remote history of  prostate cancer and active history of benign prostatic hyperplasia  Patient is typically on Flomax at home however we are withholding this while patient is n.p.o. due to altered mental status  .  Stable      Nutritional status:         Cultures; Blood Cultures x 2 >> NGT We will try to obtain sputum for culture   Antimicrobials: -10/21/2019 IV antibiotic Rocephin/azithromycin    Consultants: None   -----------------------------------------------------------------------------------------------------------------------------------------  DVT prophylaxis:  SCD/Compression stockings and Lovenox SQ Code Status:   Code Status: DNR Family Communication: No family member present at bedside- attempt will be made to update daily. On admission case was discussed with patient's daughter via phone confirm DNR status    Admission status:    Status is: Observation  The patient remains OBS appropriate and will d/c before 2 midnights.  Dispo: The patient is from: SNF              Anticipated d/c is to: SNF              Anticipated d/c date is: 2 days              Patient currently is not medically stable to d/c.        Procedures:   No admission procedures for hospital encounter.     Antimicrobials:  Anti-infectives (From admission, onward)   Start     Dose/Rate Route Frequency Ordered Stop   10/21/19 2200  cefTRIAXone (ROCEPHIN) 2 g in sodium chloride 0.9 % 100 mL IVPB     Discontinue     2 g 200 mL/hr over 30 Minutes Intravenous Every 24 hours 10/21/19 0026 10/26/19 2159   10/21/19 2200  azithromycin (ZITHROMAX) 500 mg in sodium chloride 0.9 % 250 mL IVPB     Discontinue     500 mg 250 mL/hr over 60 Minutes Intravenous Every 24 hours 10/21/19 0026 10/26/19 2159   10/20/19 2200  azithromycin (ZITHROMAX) 500 mg in sodium chloride 0.9 % 250 mL IVPB  Status:  Discontinued        500 mg 250 mL/hr over 60 Minutes Intravenous Every 24 hours 10/20/19 2154 10/21/19 0026     10/20/19 2115  cefTRIAXone (ROCEPHIN) 2 g in sodium chloride 0.9 % 100 mL IVPB        2 g 200 mL/hr over 30 Minutes Intravenous  Once 10/20/19 2112 10/20/19 2320   10/20/19 2115  doxycycline (VIBRA-TABS) tablet 100 mg  Status:  Discontinued        100 mg Oral  Once 10/20/19 2112 10/20/19 2154       Medication:  . amLODipine  10 mg Oral Daily  . enoxaparin (LOVENOX) injection  40 mg Subcutaneous Daily  . haloperidol  2 mg Oral BID  . lactobacillus acidophilus  2 tablet Oral TID  . trimethoprim-polymyxin b  1 drop Both Eyes Q6H    acetaminophen, albuterol, hydrALAZINE, neomycin-bacitracin-polymyxin, ondansetron (ZOFRAN) IV, polyethylene glycol   Objective:   Vitals:   10/21/19 0415 10/21/19 0534 10/21/19 0630 10/21/19 0830  BP: 121/72 (!) 172/126 (!) 179/117 (!) 118/99  Pulse: 64 100 79 56  Resp: 23 19 19 19   Temp:      TempSrc:      SpO2: 98% 95% 94% 97%   No intake or output data in the 24 hours ending 10/21/19 1211 There were no vitals filed for this visit.   Examination:   Physical Exam  Constitution:  Alert, cooperative, no distress,  Appears calm and comfortable  Psychiatric: Pleasantly confused, mood stable HEENT: Normocephalic, PERRL, otherwise with in Normal limits  Chest:Chest symmetric Cardio vascular:  S1/S2, RRR, No murmure, No Rubs or Gallops  pulmonary: Clear to auscultation bilaterally, respirations unlabored, negative wheezes / crackles Abdomen: Soft, non-tender, non-distended, bowel sounds,no masses, no organomegaly Muscular skeletal: Limited exam - in bed, able to move all 4 extremities, Normal strength,  Neuro: CNII-XII intact. , normal motor and sensation, reflexes intact  Extremities: No pitting edema lower extremities, +2 pulses  Skin: Dry, warm to touch, negative for any Rashes, No open wounds Wounds: per nursing documentation     ------------------------------------------------------------------------------------------------------------------------------------------    LABs:  CBC Latest Ref Rng & Units 10/21/2019 10/20/2019 09/06/2019  WBC 4.0 - 10.5 K/uL 6.6 9.4 6.5  Hemoglobin 13.0 - 17.0 g/dL 12.8(L) 14.2 12.4(L)  Hematocrit 39 - 52 % 40.4 44.4 39.1  Platelets 150 - 400 K/uL PLATELET CLUMPS NOTED ON SMEAR, COUNT APPEARS ADEQUATE 145(L) 124(L)   CMP Latest Ref Rng & Units 10/21/2019 10/20/2019 09/06/2019  Glucose 70 - 99 mg/dL 110(H) 118(H) 161(H)  BUN 8 - 23 mg/dL 13 14 8   Creatinine 0.61 - 1.24 mg/dL 1.11 1.34(H) 1.40(H)  Sodium 135 - 145 mmol/L 139 140 142  Potassium 3.5 - 5.1 mmol/L 3.9 4.3 3.5  Chloride 98 - 111 mmol/L 105 105 108  CO2 22 - 32 mmol/L 25 24 23   Calcium 8.9 - 10.3 mg/dL 9.1 9.7 9.1  Total Protein 6.5 - 8.1 g/dL 6.5 7.0 6.0(L)  Total Bilirubin 0.3 - 1.2 mg/dL 1.4(H) 1.5(H) 1.0  Alkaline Phos 38 - 126 U/L 66 63 32(L)  AST 15 - 41 U/L 116(H) 34 21  ALT 0 - 44 U/L 46(H) 18 13       Micro Results Recent Results (from the past 240 hour(s))  SARS Coronavirus 2 by RT PCR (hospital order, performed in [facility] hospital lab) Nasopharyngeal Nasopharyngeal Swab     Status: None   Collection Time: 10/20/19  8:04 PM   Specimen: Nasopharyngeal Swab  Result Value Ref Range Status   SARS Coronavirus 2 NEGATIVE NEGATIVE Final    Comment: (NOTE) SARS-CoV-2 target nucleic acids are NOT DETECTED.  The SARS-CoV-2 RNA is generally detectable in upper and lower respiratory specimens during the acute phase of infection. The lowest concentration of SARS-CoV-2 viral copies this assay can detect is 250 copies / mL. A negative result does not preclude SARS-CoV-2 infection and should not be used as the sole basis for treatment or other patient management decisions.  A negative result may occur with improper specimen collection / handling, submission of specimen other than nasopharyngeal swab, presence of  viral mutation(s) within the areas targeted by this assay, and inadequate number of viral copies (<250 copies / mL). A negative result must be combined with clinical observations, patient history, and epidemiological information.  Fact Sheet for Patients:   StrictlyIdeas.no  Fact Sheet for Healthcare Providers: BankingDealers.co.za  This test is not yet approved or  cleared by the [place] FDA and has been authorized for  detection and/or diagnosis of SARS-CoV-2 by FDA under an Emergency Use Authorization (EUA).  This EUA will remain in effect (meaning this test can be used) for the duration of the COVID-19 declaration under Section 564(b)(1) of the Act, 21 U.S.C. section 360bbb-3(b)(1), unless the authorization is terminated or revoked sooner.  Performed at [facility], [place] [address] [place],  [address]     Radiology Reports CT HEAD WO CONTRAST  Result Date: 10/21/2019 CLINICAL DATA:  delirium EXAM: CT HEAD WITHOUT CONTRAST TECHNIQUE: Contiguous axial images were obtained from the base of the skull through the vertex without intravenous contrast. COMPARISON:  September 06, 2019 FINDINGS: Somewhat limited due to patient motion Brain: No evidence of acute territorial infarction, hemorrhage, hydrocephalus,extra-axial collection or mass lesion/mass effect. There is dilatation the ventricles and sulci consistent with age-related atrophy. Low-attenuation changes in the deep white matter consistent with small vessel ischemia. Vascular: No hyperdense vessel or unexpected calcification. Skull: The skull is intact. No fracture or focal lesion identified. Sinuses/Orbits: The visualized paranasal sinuses and mastoid air cells are clear. The orbits and globes intact. Other: None IMPRESSION: No acute intracranial abnormality. Findings consistent with age related atrophy and chronic small vessel ischemia Electronically Signed   By: Prudencio Pair  M.D.   On: 10/21/2019 01:13   CT CHEST W CONTRAST  Result Date: 10/21/2019 CLINICAL DATA:  Unresolved pneumonia with left parapneumonic effusion. Could not cul concern for underlying malignancy. Productive cough and fever. EXAM: CT CHEST WITH CONTRAST TECHNIQUE: Multidetector CT imaging of the chest was performed during intravenous contrast administration. CONTRAST:  35mL OMNIPAQUE IOHEXOL 300 MG/ML  SOLN COMPARISON:  Chest radiograph 10/20/2019 FINDINGS: Cardiovascular: Mild right heart enlargement. No pericardial effusions. Normal caliber thoracic aorta. Scattered aortic calcifications. Coronary artery calcifications. Mediastinum/Nodes: No significant mediastinal lymphadenopathy. Esophagus is mildly dilated and gas filled with decompression distally. This could indicate reflux or dysmotility. If there is a high suspicion of malignancy, could consider endoscopy to exclude an esophageal neoplasm. There is a moderate-sized hiatal hernia and elevation of the left hemidiaphragm with most of the stomach in the chest. Lungs/Pleura: Emphysematous changes in the lung apices. Atelectasis or consolidation in both lung bases, greater on the left. Small left pleural effusion. Upper Abdomen: Elevation of the left hemidiaphragm accounts for most of the density in the left lower chest on the previous chest radiograph. Most of the stomach and the splenic flexure of the colon are included. Diverticulosis of the visualized colon. Suspicion of hepatic bile duct dilatation. Motion artifact limits examination. Musculoskeletal: Degenerative changes throughout the thoracic spine. No destructive bone lesions appreciated. IMPRESSION: 1. Atelectasis or consolidation in both lung bases, greater on the left, with small left pleural effusion. 2. Dilated esophagus. Likely reflux or dysmotility but consider endoscopy for exclusion of focal esophageal lesion if there is high suspicion of malignancy. Moderate-sized hiatal hernia and elevation  of the left hemidiaphragm with most of the stomach in the chest. 3. Suspicion of hepatic bile duct dilatation. 4. Emphysematous changes in the lung apices. 5. Colonic diverticulosis. 6. Emphysema and aortic atherosclerosis. Aortic Atherosclerosis (ICD10-I70.0) and Emphysema (ICD10-J43.9). Electronically Signed   By: Lucienne Capers M.D.   On: 10/21/2019 01:18   DG Chest Portable 1 View  Result Date: 10/20/2019 CLINICAL DATA:  Fever and shortness of breath EXAM: PORTABLE CHEST 1 VIEW COMPARISON:  September 06, 2019 FINDINGS: The heart size and mediastinal contours are unchanged with mild cardiomegaly. There is stable elevation of the left hemidiaphragm. A probable loculated small left  basilar effusion is seen with airspace opacity. The right lung is clear. IMPRESSION: Elevation of the left hemidiaphragm with a small left pleural effusion and basilar opacity which could be atelectasis and/or layering effusion. Electronically Signed   By: Prudencio Pair M.D.   On: 10/20/2019 20:24    SIGNED: Deatra James, MD, FACP, FHM. [facility],  Pager (please use [email or web address] to page/text)  If 7PM-7AM, please contact night-coverage [email or web address] 10/21/2019, 12:11 PM

## 2019-10-21 NOTE — Plan of Care (Signed)
  Problem: Activity: Goal: Ability to tolerate increased activity will improve Outcome: Progressing   Problem: Clinical Measurements: Goal: Ability to maintain a body temperature in the normal range will improve Outcome: Progressing   Problem: Respiratory: Goal: Ability to maintain adequate ventilation will improve Outcome: Progressing Goal: Ability to maintain a clear airway will improve Outcome: Progressing   

## 2019-10-21 NOTE — H&P (Signed)
History and Physical    Ernest Turner NLZ:[id number] DOB: Dec 26, 1940 DOA: 10/20/2019  PCP: Orvis Brill, [facility]  Patient coming from: [facility]   Chief Complaint:  Chief Complaint  Patient presents with  . Cough     HPI:    79 year old male with past medical history of prostate cancer in the past (2014), current diagnosis of BPH, gastroesophageal reflux disease, hypertension, Alzheimer's dementia who presents to [facility] emergency department from [facility] facility for new onset of cough.  Per the emergency department staff, cough is only been ongoing for 1 to 2 days.  Patient is extremely lethargic on arrival, the dramatic change from his reported baseline of being talkative and pleasantly confused at baseline.  Patient is unable to provide a history at this time.  Upon evaluation in the emergency department, chest x-ray reveals low-grade temperature of 100.1 F.  COVID-19 testing has been negative.  Chest x-ray has revealed left lower lobe pneumonia with parapneumonic effusion.  Patient is a known history of elevated left hemidiaphragm obscuring interpretation somewhat.  Patient has been initiated on intravenous ceftriaxone and azithromycin the hospitalist department called to assess the patient for admission the hospital.   Review of Systems: Unable to obtain due to patient being extremely lethargic with known history of advanced dementia.   Past Medical History:  Diagnosis Date  . Arthritis   . BPH (benign prostatic hyperplasia)   . Depression   . ED (erectile dysfunction)   . Full dentures   . Heart murmur   . Hemorrhoids   . History of peptic ulcer   . History of radiation therapy to prostate 7800 cGy 40 sessions 04-11-2012 to 06-11-2012   EXTERNAL BEAM PELVIC AREA FOR PROSTATE CANCER  . History of urinary retention   . HOH (hard of hearing)   . Hyperlipidemia   . Hypertension   . Hypogonadism male   . Lower urinary  tract symptoms (LUTS)   . Memory difficulty   . Memory loss   . Poor historian   . Prostate cancer [facility]) dx 12/26/11  urologist-  dr Gaynelle Arabian  oncologist-  dr Valere Dross   GOLD SEED IMPLANT  03-07-2012--- Adenocarcinoma,gleason=3+3=6.,& 3+4=7,PSA=15.84--  s/p  external beam radiation 04-11-2012 to 06-11-2012  . Wears glasses     Past Surgical History:  Procedure Laterality Date  . CIRCUMCISION  09-21-2006  . COLONOSCOPY    . CYSTOSCOPY Right 10/16/2014   Procedure: CYSTO, RIGHT RETROGRADE COOK BALLOON DILATION RIGHT PROXIMAL URETHERAL STRICTURE;  Surgeon: Carolan Clines, MD;  Location: [place];  Service: Urology;  Laterality: Right;  . INGUINAL HERNIA REPAIR Left 04/29/2014   Procedure: LEFT INGUINAL HERNIA REPAIR;  Surgeon: Coralie Keens, MD;  Location: [place];  Service: General;  Laterality: Left;  . INSERTION OF MESH Left 04/29/2014   Procedure: INSERTION OF MESH;  Surgeon: Coralie Keens, MD;  Location: [place];  Service: General;  Laterality: Left;  . MOUTH SURGERY     gum surgery  . MULTIPLE TOOTH EXTRACTIONS    . PROSTATE BIOPSY  12/26/11   PSA=15.84,gleason=3+3=6,& 3+4=7,Volume=27.98cc,Adenocarcinoma     reports that he quit smoking about 35 years ago. His smoking use included cigarettes. He has a 40.00 pack-year smoking history. He has never used smokeless tobacco. He reports that he does not drink alcohol and does not use drugs.  Allergies  Allergen Reactions  . Nutritional Supplements Other (See Comments)    Prostate supplement (Daughter is unable to confirm)  . Androgel [  Testosterone] Other (See Comments)    (Daughter is unable to confirm)  . Viagra [Sildenafil Citrate] Other (See Comments)    (Daughter is unable to confirm)    Family History  Problem Relation Age of Onset  . Cancer Sister        liver to brain mets died late 32's  . Cancer Daughter        breast cancer died age 45  . Diabetes Mother      Prior to Admission medications     Medication Sig Start Date End Date Taking? Authorizing Provider  diclofenac sodium (VOLTAREN) 1 % GEL Apply 4 g topically 4 (four) times daily. Patient not taking: Reported on 07/31/2019 08/12/15   Marcial Pacas, MD  donepezil (ARICEPT) 10 MG tablet Take 1 tablet (10 mg total) by mouth every evening. Patient not taking: Reported on 07/31/2019 08/12/15   Marcial Pacas, MD  memantine (NAMENDA) 10 MG tablet Take 1 tablet (10 mg total) by mouth 2 (two) times daily. Patient not taking: Reported on 07/31/2019 08/12/15   Marcial Pacas, MD  phenazopyridine (PYRIDIUM) 200 MG tablet Take 1 tablet (200 mg total) by mouth 3 (three) times daily as needed for pain. Patient not taking: Reported on 07/31/2019 10/16/14   Carolan Clines, MD  risperiDONE (RISPERDAL) 0.25 MG tablet Take 1 tablet (0.25 mg total) by mouth at bedtime. 08/02/19   Blanchie Dessert, MD  tamsulosin (FLOMAX) 0.4 MG CAPS capsule Take 1 capsule (0.4 mg total) by mouth daily after breakfast. Take tamsulosin only if having difficulty voiding. Patient not taking: Reported on 07/31/2019 10/16/14   Carolan Clines, MD  triamterene-hydrochlorothiazide (MAXZIDE-25) 37.5-25 MG tablet Take 1 tablet by mouth in the morning. 05/21/19   [provider]    Physical Exam: Vitals:   10/20/19 2145 10/20/19 2200 10/20/19 2228 10/20/19 2319  BP: (!) 150/63 (!) 135/89 (!) 136/75 (!) 131/69  Pulse: 71 71 72 74  Resp: (!) 26  16 18   Temp:      TempSrc:      SpO2: 97% 98% 97% 97%    Constitutional: Patient is extremely lethargic and minimally arousable.  Patient is currently not in associated distress. Skin: no rashes, no lesions, notable poor skin turgor. Eyes: Purulent crusting of the bilateral eyes noted.  Pupils are equally reactive to light.  No evidence of scleral icterus or conjunctival pallor.  ENMT: Extremely dry mucous membranes noted.  Patient unwilling to open his mouth to the point of posterior oropharynx examination. Neck: normal, supple, no  masses, no thyromegaly.  No evidence of jugular venous distension.   Respiratory: Markedly diminished breath sounds in the left base with rales and rhonchi noted throughout.   Normal respiratory effort. No accessory muscle use.  Cardiovascular: Regular rate and rhythm, no murmurs / rubs / gallops. No extremity edema. 2+ pedal pulses. No carotid bruits.  Chest:   Nontender without crepitus or deformity.   Back:   Nontender without crepitus or deformity. Abdomen: Abdomen is soft and nontender.  No evidence of intra-abdominal masses.  Positive bowel sounds noted in all quadrants.   Musculoskeletal: No joint deformity upper and lower extremities. Good ROM, no contractures. Normal muscle tone.  Neurologic: Patient is extremely lethargic and minimally responsive to both verbal and painful stimuli.  Patient is not following commands.  Patient does seem to spontaneously move all 4 extremities.   Psychiatric: Unable to perform this portion of the examination due to significant lethargy.  Patient currently does not seem to possess insight  as to his current situation.   Labs on Admission: I have personally reviewed following labs and imaging studies -   CBC: Recent Labs  Lab 10/20/19 2003  WBC 9.4  NEUTROABS 7.1  HGB 14.2  HCT 44.4  MCV 95.5  PLT 856*   Basic Metabolic Panel: Recent Labs  Lab 10/20/19 2005  NA 140  K 4.3  CL 105  CO2 24  GLUCOSE 118*  BUN 14  CREATININE 1.34*  CALCIUM 9.7   GFR: CrCl cannot be calculated (Unknown ideal weight.). Liver Function Tests: Recent Labs  Lab 10/20/19 2005  AST 34  ALT 18  ALKPHOS 63  BILITOT 1.5*  PROT 7.0  ALBUMIN 3.7   No results for input(s): LIPASE, AMYLASE in the last 168 hours. No results for input(s): AMMONIA in the last 168 hours. Coagulation Profile: No results for input(s): INR, PROTIME in the last 168 hours. Cardiac Enzymes: No results for input(s): CKTOTAL, CKMB, CKMBINDEX, TROPONINI in the last 168 hours. BNP (last 3  results) No results for input(s): PROBNP in the last 8760 hours. HbA1C: No results for input(s): HGBA1C in the last 72 hours. CBG: No results for input(s): GLUCAP in the last 168 hours. Lipid Profile: No results for input(s): CHOL, HDL, LDLCALC, TRIG, CHOLHDL, LDLDIRECT in the last 72 hours. Thyroid Function Tests: No results for input(s): TSH, T4TOTAL, FREET4, T3FREE, THYROIDAB in the last 72 hours. Anemia Panel: No results for input(s): VITAMINB12, FOLATE, FERRITIN, TIBC, IRON, RETICCTPCT in the last 72 hours. Urine analysis:    Component Value Date/Time   COLORURINE YELLOW 09/06/2019 0932   APPEARANCEUR CLEAR 09/06/2019 0932   LABSPEC 1.016 09/06/2019 0932   PHURINE 5.0 09/06/2019 0932   GLUCOSEU NEGATIVE 09/06/2019 0932   HGBUR SMALL (A) 09/06/2019 0932   BILIRUBINUR NEGATIVE 09/06/2019 0932   KETONESUR NEGATIVE 09/06/2019 0932   PROTEINUR NEGATIVE 09/06/2019 0932   UROBILINOGEN 0.2 08/08/2014 1357   NITRITE NEGATIVE 09/06/2019 0932   LEUKOCYTESUR NEGATIVE 09/06/2019 0932    Radiological Exams on Admission - Personally Reviewed: DG Chest Portable 1 View  Result Date: 10/20/2019 CLINICAL DATA:  Fever and shortness of breath EXAM: PORTABLE CHEST 1 VIEW COMPARISON:  September 06, 2019 FINDINGS: The heart size and mediastinal contours are unchanged with mild cardiomegaly. There is stable elevation of the left hemidiaphragm. A probable loculated small left basilar effusion is seen with airspace opacity. The right lung is clear. IMPRESSION: Elevation of the left hemidiaphragm with a small left pleural effusion and basilar opacity which could be atelectasis and/or layering effusion. Electronically Signed   By: Prudencio Pair M.D.   On: 10/20/2019 20:24    EKG: Personally reviewed.  Rhythm is normal sinus rhythm with heart rate of 78 bpm.  No dynamic ST segment changes appreciated.  Assessment/Plan Principal Problem:   Pneumonia of left lower lobe due to infectious organism   Patient  presenting with several day history of cough with evidence of significant lethargy and likely encephalopathy on arrival.  Chest x-ray revealing left lower lobe infiltrate with possible parapneumonic effusion.  Considering patient's minimal fever and lack of leukocytosis with obscuration of the left lower lobe infiltrate by left hemidiaphragm, will obtain CT imaging of the chest to ensure that there is no underlying malignancy and to better evaluate the degree of pleural effusion.  Placing patient on intravenous ceftriaxone and azithromycin  Hydrating patient with intravenous isotonic fluids especially considering concurrent mild lactic acidosis.  Blood cultures have been ordered  If the parapneumonic effusion is significant  in size or there is evidence of empyema, patient could potentially benefit from IR guided thoracentesis.  Supplemental oxygen for bouts of hypoxia  As needed bronchodilator therapy.  Active Problems:   Acute metabolic encephalopathy   Per my discussion with the daughter who is the patient's decision-maker, patient is typically pleasantly confused and "very bubbly."   Patient is likely suffering from acute metabolic encephalopathy secondary to underlying pneumonia.  Obtaining CT head, TSH, B12, folate  Monitoring for clinical improvement with intravenous antibiotic therapy and intravenous fluids.  N.p.o. for now until mentation improves.    Parapneumonic effusion   Please see assessment and plan above.    Lactic acidosis   Patient exhibiting a mild lactic acidosis secondary to underlying infection  Hydrating patient with intravenous isotonic fluids and providing patient with broad-spectrum intravenous antibiotic therapy.  Performing serial lactic acid levels to ensure downtrending and resolution.    Bacterial conjunctivitis of both eyes   Significant purulent crusting of both eyes noted on admission examination  Will place patient on a 7-day  course of Polytrim eyedrops twice daily.    Essential hypertension   Conservative blood pressure target considering advanced age and comorbidities  As needed intravenous antihypertensives for excessively elevated blood pressures.    BPH (benign prostatic hyperplasia)  Patient has both a remote history of prostate cancer and active history of benign prostatic hyperplasia  Patient is typically on Flomax at home however we are withholding this while patient is n.p.o. due to altered mental status   Code Status:  DNR Family Communication: Case has been discussed with daughter the decision-maker via phone conversation who has been updated on plan of care.  Daughter states that his the patient wished that he would be DNR.  Status is: Observation  The patient remains OBS appropriate and will d/c before 2 midnights.  Dispo: The patient is from: SNF              Anticipated d/c is to: SNF              Anticipated d/c date is: 2 days              Patient currently is not medically stable to d/c.        Vernelle Emerald MD [facility] Pager [phone]  If 7PM-7AM, please contact night-coverage [email or web address] Use universal [place] password for that web site. If you do not have the password, please call the hospital operator.  10/21/2019, 12:30 AM

## 2019-10-22 DIAGNOSIS — J168 Pneumonia due to other specified infectious organisms: Secondary | ICD-10-CM | POA: Diagnosis not present

## 2019-10-22 DIAGNOSIS — J189 Pneumonia, unspecified organism: Secondary | ICD-10-CM | POA: Diagnosis not present

## 2019-10-22 LAB — CBC
HCT: 38.1 % — ABNORMAL LOW (ref 39.0–52.0)
Hemoglobin: 12.4 g/dL — ABNORMAL LOW (ref 13.0–17.0)
MCH: 30.4 pg (ref 26.0–34.0)
MCHC: 32.5 g/dL (ref 30.0–36.0)
MCV: 93.4 fL (ref 80.0–100.0)
Platelets: 115 10*3/uL — ABNORMAL LOW (ref 150–400)
RBC: 4.08 MIL/uL — ABNORMAL LOW (ref 4.22–5.81)
RDW: 14.7 % (ref 11.5–15.5)
WBC: 6.3 10*3/uL (ref 4.0–10.5)
nRBC: 0 % (ref 0.0–0.2)

## 2019-10-22 LAB — BASIC METABOLIC PANEL
Anion gap: 14 (ref 5–15)
BUN: 9 mg/dL (ref 8–23)
CO2: 23 mmol/L (ref 22–32)
Calcium: 8.9 mg/dL (ref 8.9–10.3)
Chloride: 104 mmol/L (ref 98–111)
Creatinine, Ser: 1.05 mg/dL (ref 0.61–1.24)
GFR calc Af Amer: >60 mL/min (ref >60)
GFR calc non Af Amer: >60 mL/min (ref >60)
Glucose, Bld: 86 mg/dL (ref 70–99)
Potassium: 3.5 mmol/L (ref 3.5–5.1)
Sodium: 141 mmol/L (ref 135–145)

## 2019-10-22 MED ORDER — AZITHROMYCIN 250 MG PO TABS
500.0000 mg | ORAL_TABLET | Freq: Every day | ORAL | Status: DC
  Administered 2019-10-22: 500 mg via ORAL
  Filled 2019-10-22: qty 2

## 2019-10-22 MED ORDER — PROSIGHT PO TABS
1.0000 | ORAL_TABLET | Freq: Every day | ORAL | Status: DC
  Administered 2019-10-23: 1 via ORAL
  Filled 2019-10-22: qty 1

## 2019-10-22 MED ORDER — ENSURE ENLIVE PO LIQD
237.0000 mL | Freq: Two times a day (BID) | ORAL | Status: DC
  Administered 2019-10-22 – 2019-10-23 (×2): 237 mL via ORAL

## 2019-10-22 NOTE — Evaluation (Signed)
Clinical/Bedside Swallow Evaluation Patient Details  Name: Ernest Turner MRN: [id number] Date of Birth: [birth year]  Today's Date: 10/22/2019 Time: SLP Start Time (ACUTE ONLY): 8921 SLP Stop Time (ACUTE ONLY): 1122 SLP Time Calculation (min) (ACUTE ONLY): 8 min  Past Medical History:  Past Medical History:  Diagnosis Date  . Arthritis   . BPH (benign prostatic hyperplasia)   . Depression   . ED (erectile dysfunction)   . Full dentures   . Heart murmur   . Hemorrhoids   . History of peptic ulcer   . History of radiation therapy to prostate 7800 cGy 40 sessions 04-11-2012 to 06-11-2012   EXTERNAL BEAM PELVIC AREA FOR PROSTATE CANCER  . History of urinary retention   . HOH (hard of hearing)   . Hyperlipidemia   . Hypertension   . Hypogonadism male   . Lower urinary tract symptoms (LUTS)   . Memory difficulty   . Memory loss   . Poor historian   . Prostate cancer [facility]) dx 12/26/11  urologist-  dr Gaynelle Arabian  oncologist-  dr Valere Dross   GOLD SEED IMPLANT  03-07-2012--- Adenocarcinoma,gleason=3+3=6.,& 3+4=7,PSA=15.84--  s/p  external beam radiation 04-11-2012 to 06-11-2012  . Wears glasses    Past Surgical History:  Past Surgical History:  Procedure Laterality Date  . CIRCUMCISION  09-21-2006  . COLONOSCOPY    . CYSTOSCOPY Right 10/16/2014   Procedure: CYSTO, RIGHT RETROGRADE COOK BALLOON DILATION RIGHT PROXIMAL URETHERAL STRICTURE;  Surgeon: Carolan Clines, MD;  Location: [place];  Service: Urology;  Laterality: Right;  . INGUINAL HERNIA REPAIR Left 04/29/2014   Procedure: LEFT INGUINAL HERNIA REPAIR;  Surgeon: Coralie Keens, MD;  Location: [place];  Service: General;  Laterality: Left;  . INSERTION OF MESH Left 04/29/2014   Procedure: INSERTION OF MESH;  Surgeon: Coralie Keens, MD;  Location: [place];  Service: General;  Laterality: Left;  . MOUTH SURGERY     gum surgery  . MULTIPLE TOOTH EXTRACTIONS    . PROSTATE BIOPSY  12/26/11    PSA=15.84,gleason=3+3=6,& 3+4=7,Volume=27.98cc,Adenocarcinoma   HPI:  79 year old male with past medical history of dementia, prostate cancer (2014), BPH, gastroesophageal reflux disease, hypertension  who presents with new onset of cough. Diagnoses with pna and metabolic encephalopathy. Chest x-ray revealed dilated esophagus. Likely reflux or dysmotility but consider endoscopy for exclusion of focal esophageal lesion if there is high suspicion of malignancy. Moderate-sized hiatal hernia and elevation of the left hemidiaphragm with most of the stomach in the chest, emphysema.     Assessment / Plan / Recommendation Clinical Impression  Pt exhibited a functional oropharyngeal swallow during assessment. He was pleasantly confused and cooperative, following most directions and able to self feed. He has a history of GER and CXR showed dilated esophagus, moderate-sized hiatal hernia and elevation of the left hemidiaphragm with most of the stomach in the chest. There was one small, delayed throat clear with swallows that did not seem to be delayed from observation. Manipulation and consumption with solid was functional however he does not have upper dentures and cannot relay information re: mastication due to dementia. He will have an increased aspiration risk given esophageal impairments and dementia. Agree with Dys 2 (fine chopped), thin liquids, crush pills. He should remain upright 45 min after meals and eats smaller amounts more frequently. Limit distractions. No further ST needed. When pt returns to SNF, staff can upgrade diet when appropriate.     SLP Visit Diagnosis: Dysphagia, unspecified (R13.10)    Aspiration Risk  Mild aspiration risk;Moderate aspiration risk    Diet Recommendation Dysphagia 2 (Fine chop);Thin liquid   Liquid Administration via: Cup;Straw Medication Administration: Crushed with puree Supervision: Patient able to self feed;Full supervision/cueing for compensatory  strategies Compensations: Slow rate;Small sips/bites;Minimize environmental distractions;Lingual sweep for clearance of pocketing Postural Changes: Seated upright at 90 degrees;Remain upright for at least 30 minutes after po intake    Other  Recommendations Oral Care Recommendations: Oral care BID   Follow up Recommendations None      Frequency and Duration            Prognosis        Swallow Study   General HPI: 79 year old male with past medical history of dementia, prostate cancer (2014), BPH, gastroesophageal reflux disease, hypertension  who presents with new onset of cough. Diagnoses with pna and metabolic encephalopathy. Chest x-ray revealed dilated esophagus. Likely reflux or dysmotility but consider endoscopy for exclusion of focal esophageal lesion if there is high suspicion of malignancy. Moderate-sized hiatal hernia and elevation of the left hemidiaphragm with most of the stomach in the chest, emphysema.   Type of Study: Bedside Swallow Evaluation Previous Swallow Assessment: none Diet Prior to this Study: Thin liquids;Dysphagia 2 (chopped) Temperature Spikes Noted: No Respiratory Status: Room air History of Recent Intubation: No Behavior/Cognition: Alert;Cooperative;Pleasant mood;Confused;Requires cueing Oral Cavity Assessment: Within Functional Limits Oral Care Completed by SLP: No Oral Cavity - Dentition: Missing dentition;Other (Comment) (no upper, several lower teeth) Vision: Functional for self-feeding Self-Feeding Abilities: Able to feed self Patient Positioning: Upright in bed Baseline Vocal Quality: Normal Volitional Cough: Strong Volitional Swallow: Able to elicit    Oral/Motor/Sensory Function Overall Oral Motor/Sensory Function: Within functional limits   Ice Chips Ice chips: Not tested   Thin Liquid Thin Liquid: Within functional limits Presentation: Cup;Straw Other Comments:  (delayed throat clear but functional )    Nectar Thick Nectar Thick  Liquid: Not tested   Honey Thick Honey Thick Liquid: Not tested   Puree Puree: Within functional limits   Solid     Solid: Within functional limits Pharyngeal Phase Impairments:  (none)      Houston Siren 10/22/2019,12:34 PM   Orbie Pyo Colvin Caroli.Ed Risk analyst [phone] Office [phone]

## 2019-10-22 NOTE — Progress Notes (Signed)
Initial Nutrition Assessment  DOCUMENTATION CODES:   Not applicable  INTERVENTION:  Please obtain updated weight.   Ensure Enlive po BID, each supplement provides 350 kcal and 20 grams of protein  MVI daily  NUTRITION DIAGNOSIS:   Inadequate oral intake related to lethargy/confusion as evidenced by per patient/family report.    GOAL:   Patient will meet greater than or equal to 90% of their needs    MONITOR:   PO intake, Supplement acceptance, Weight trends, Labs, I & O's  REASON FOR ASSESSMENT:   Consult Assessment of nutrition requirement/status  ASSESSMENT:   Pt from SNF admitted with pneumonia due to infectious organism. PMH includes hx of prostate cancer (2014), BPH, GERD, HTN, Alzheimer's dementia.  Pt unavailable at time of RD visit.   Per H&P, pt is talkative and pleasantly confused at baseline; however, pt noted to be lethargic.   No PO intake documented.  No recent wt history to review.   Labs reviewed. Medications: Bacid, Vitamin B12  NUTRITION - FOCUSED PHYSICAL EXAM:  Unable to perform at this time, will attempt at follow-up.   Diet Order:   Diet Order            DIET DYS 2 Room service appropriate? Yes; Fluid consistency: Thin  Diet effective now                 EDUCATION NEEDS:   No education needs have been identified at this time  Skin:  Skin Assessment: Reviewed RN Assessment  Last BM:  PTA  Height:   Ht Readings from Last 1 Encounters:  08/12/15 5\' 9"  (1.753 m)    Weight:   Wt Readings from Last 1 Encounters:  08/12/15 81.8 kg    BMI:  There is no height or weight on file to calculate BMI.  Estimated Nutritional Needs:   Kcal:  1900-2100  Protein:  95-105 grams  Fluid:  >/= 1.9L/d    Larkin Ina, MS, RD, LDN RD pager number and weekend/on-call pager number located in [place].

## 2019-10-22 NOTE — Progress Notes (Signed)
PROGRESS NOTE    Patient: Ernest Turner                            PCP: [facility]                    DOB: Jan 14, 1941            DOA: 10/20/2019 [id number]             DOS: 10/22/2019, 1:10 PM   LOS: 0 days   Date of Service: The patient was seen and examined on 10/22/2019  Subjective:   The patient was seen and examined this morning, awake, following commands no acute distress laying comfortably in bed.  He remains pleasantly demented. He is not complaining of pain or any discomfort, but cannot engage in any meaningful conversation.  Brief Narrative:  Per HPI: [name] ia s 79 year old male with past medical history of advanced infection prostate cancer in the past (2014), current diagnosis of BPH, GERD, HTN,  Alzheimer's dementia who presented from [facility] skilled nursing facility for new onset of cough. ED: Chest x-ray revealed Tem of 100.1 F.  COVID-19; negative.  Chest x-ray has revealed left lower lobe pneumonia with parapneumonic effusion.  Patient is a known history of elevated left hemidiaphragm obscuring interpretation somewhat.  -Cultures were obtained patient was initiated on IV antibiotics of Rocephin and azithromycin,  10/21/2019 Patient remained hemodynamically stable, on IV antibiotics CT of the chest-reviewed consistent with pneumonia  Assessment & Plan:   Principal Problem:   Pneumonia of left lower lobe due to infectious organism Active Problems:   BPH (benign prostatic hyperplasia)   Acute metabolic encephalopathy   Parapneumonic effusion   Lactic acidosis   Bacterial conjunctivitis of both eyes   Essential hypertension   Pneumonia of left lower lobe due to infectious organism Patient was evaluated for early signs of SIRS/ SEPSIS Temp 100.1, pulse 56, RR 19, BP 121/72, satting 97% on room air, lactic acid 2.0, WBC 6.6 -Therefore sepsis was ruled out   Mental status back to baseline, pleasantly demented  On  admission mild lethargic, confusion in setting of severe dementia >> back to baseline  Currently hemodynamically stable, afebrile normotensive    Chest x-ray revealing left lower lobe infiltrate with possible parapneumonic effusion.  CT of the chest reviewed: Consolidation greater on the left, esophageal dilatation, hepatic bile duct dilatation, emphysema--chronic changes were noted  We will continue  IV of antibiotics of Rocephin and azithromycin  Continue gentle IV fluid hydration   Supplemental oxygen for bouts of hypoxia  As needed bronchodilator therapy.  -SARS-CoV-2 negative -Blood cultures x2 10-21-19 >> no growth to date   Active Problems:    Acute metabolic encephalopathy -in the setting of infection, severe dementia   Per my discussion with the daughter who is the patient's decision-maker, patient is typically pleasantly confused and "very bubbly." ...  Seems to be back at baseline  Metabolic encephalopathy with underlying dementia and infection -c  Patient is likely suffering from acute metabolic encephalopathy secondary to underlying pneumonia.  CT head; reviewed, chronic changes, no acute findings   TSH; nl 0.439, B12; 149 (low)   Monitoring for clinical improvement with intravenous antibiotic therapy and intravenous fluids.  P.o. has been initiated, passed bedside patient, aspiration precaution    Parapneumonic effusion   CT abdomen pelvis   Mild Lactic acidosis   LA 2.0 >> 1.4, 1.5  No overt signs of sepsis  Continue IV fluid resuscitation, broad-spectrum antibiotics     Bacterial conjunctivitis of both eyes   Significant purulent crusting of both eyes noted on admission examination  Will place patient on a 7-day course of Polytrim eyedrops twice daily.    Essential hypertension   We will remain conservative, monitoring closely, reinitiating BP meds accordingly  Added Norvasc 5 mg daily  Continue as needed hydralazine     BPH (benign prostatic hyperplasia)  Patient has both a remote history of prostate cancer and active history of benign prostatic hyperplasia  Patient is typically on Flomax at home however we are withholding this while patient is n.p.o. due to altered mental status  .  Stable   Vitamin B12 deficiency -Was repleted IV x1 8-21, will continue with p.o. supplements    Nutritional status:  Nutrition Problem: Inadequate oral intake Etiology: lethargy/confusion Signs/Symptoms: per patient/family report Interventions: Ensure Enlive (each supplement provides 350kcal and 20 grams of protein), MVI  Cultures; Blood Cultures x 2 >> NGT We will try to obtain sputum for culture   Antimicrobials: -10/21/2019 IV antibiotic Rocephin/azithromycin    Consultants: None   ------------------------------------------------------------------------------------------------------------------------------------  DVT prophylaxis:  SCD/Compression stockings and Lovenox SQ Code Status:   Code Status: DNR Family Communication: Patient's daughter Arvell Pulsifer was called and updated  patient's daughter via phone confirm DNR status    Admission status:    Status is: Observation  The patient remains OBS appropriate and will d/c before 2 midnights.  Dispo: The patient is from: SNF [facility]              Anticipated d/c is to: SNF              Anticipated d/c date is: 2 days              Patient currently is not medically stable to d/c.        Procedures:   No admission procedures for hospital encounter.     Antimicrobials:  Anti-infectives (From admission, onward)   Start     Dose/Rate Route Frequency Ordered Stop   10/22/19 2200  azithromycin (ZITHROMAX) tablet 500 mg     Discontinue     500 mg Oral Daily at bedtime 10/22/19 1131 10/25/19 2159   10/21/19 2200  cefTRIAXone (ROCEPHIN) 2 g in sodium chloride 0.9 % 100 mL IVPB     Discontinue     2 g 200 mL/hr over 30  Minutes Intravenous Every 24 hours 10/21/19 0026 10/26/19 2159   10/21/19 2200  azithromycin (ZITHROMAX) 500 mg in sodium chloride 0.9 % 250 mL IVPB  Status:  Discontinued        500 mg 250 mL/hr over 60 Minutes Intravenous Every 24 hours 10/21/19 0026 10/22/19 1131   10/20/19 2200  azithromycin (ZITHROMAX) 500 mg in sodium chloride 0.9 % 250 mL IVPB  Status:  Discontinued        500 mg 250 mL/hr over 60 Minutes Intravenous Every 24 hours 10/20/19 2154 10/21/19 0026   10/20/19 2115  cefTRIAXone (ROCEPHIN) 2 g in sodium chloride 0.9 % 100 mL IVPB        2 g 200 mL/hr over 30 Minutes Intravenous  Once 10/20/19 2112 10/20/19 2320   10/20/19 2115  doxycycline (VIBRA-TABS) tablet 100 mg  Status:  Discontinued        100 mg Oral  Once 10/20/19 2112 10/20/19 2154       Medication:  . amLODipine  10 mg Oral Daily  . azithromycin  500 mg Oral QHS  . enoxaparin (LOVENOX) injection  40 mg Subcutaneous Daily  . feeding supplement (ENSURE ENLIVE)  237 mL Oral BID BM  . haloperidol  2 mg Oral BID  . lactobacillus acidophilus  2 tablet Oral TID  . multivitamin  1 tablet Oral Daily  . trimethoprim-polymyxin b  1 drop Both Eyes Q6H  . vitamin B-12  1,000 mcg Oral Daily    acetaminophen, albuterol, hydrALAZINE, neomycin-bacitracin-polymyxin, ondansetron (ZOFRAN) IV, polyethylene glycol   Objective:   Vitals:   10/21/19 1617 10/21/19 2100 10/21/19 2148 10/22/19 0733  BP: 127/66  132/68 (!) 146/77  Pulse: 70  68 63  Resp: 14 15 16 16   Temp: 98.6 F (37 C)  98.1 F (36.7 C) 98.2 F (36.8 C)  TempSrc:   Oral   SpO2: 95%  98%     Intake/Output Summary (Last 24 hours) at 10/22/2019 1310 Last data filed at 10/22/2019 0414 Gross per 24 hour  Intake 530 ml  Output 400 ml  Net 130 ml   There were no vitals filed for this visit.   Examination:      Physical Exam:   General:   Awake alert oriented x1, no acute distress, laying in bed comfortable interactive cooperative  HEENT:   Normocephalic, PERRL, otherwise with in Normal limits   Neuro:  CNII-XII intact. , normal motor and sensation, reflexes intact   Lungs:   Clear to auscultation BL, Respirations unlabored, no wheezes / crackles  Cardio:    S1/S2, RRR, No murmure, No Rubs or Gallops   Abdomen:   Soft, non-tender, bowel sounds active all four quadrants,  no guarding or peritoneal signs.  Muscular skeletal:   Generalized weakness noted, Limited exam - in bed, able to move all 4 extremities, Normal strength,  2+ pulses,  symmetric, No pitting edema  Skin:  Dry, warm to touch, negative for any Rashes, No open wounds   Wounds: None visible please see documentation         ------------------------------------------------------------------------------------------------------------------------------------------    LABs:  CBC Latest Ref Rng & Units 10/22/2019 10/21/2019 10/20/2019  WBC 4.0 - 10.5 K/uL 6.3 6.6 9.4  Hemoglobin 13.0 - 17.0 g/dL 12.4(L) 12.8(L) 14.2  Hematocrit 39 - 52 % 38.1(L) 40.4 44.4  Platelets 150 - 400 K/uL 115(L) PLATELET CLUMPS NOTED ON SMEAR, COUNT APPEARS ADEQUATE 145(L)   CMP Latest Ref Rng & Units 10/22/2019 10/21/2019 10/20/2019  Glucose 70 - 99 mg/dL 86 110(H) 118(H)  BUN 8 - 23 mg/dL 9 13 14   Creatinine 0.61 - 1.24 mg/dL 1.05 1.11 1.34(H)  Sodium 135 - 145 mmol/L 141 139 140  Potassium 3.5 - 5.1 mmol/L 3.5 3.9 4.3  Chloride 98 - 111 mmol/L 104 105 105  CO2 22 - 32 mmol/L 23 25 24   Calcium 8.9 - 10.3 mg/dL 8.9 9.1 9.7  Total Protein 6.5 - 8.1 g/dL - 6.5 7.0  Total Bilirubin 0.3 - 1.2 mg/dL - 1.4(H) 1.5(H)  Alkaline Phos 38 - 126 U/L - 66 63  AST 15 - 41 U/L - 116(H) 34  ALT 0 - 44 U/L - 46(H) 18       Micro Results Recent Results (from the past 240 hour(s))  SARS Coronavirus 2 by RT PCR (hospital order, performed in [place] hospital lab) Nasopharyngeal Nasopharyngeal Swab     Status: None   Collection Time: 10/20/19  8:04 PM   Specimen: Nasopharyngeal Swab  Result Value Ref  Range  Status   SARS Coronavirus 2 NEGATIVE NEGATIVE Final    Comment: (NOTE) SARS-CoV-2 target nucleic acids are NOT DETECTED.  The SARS-CoV-2 RNA is generally detectable in upper and lower respiratory specimens during the acute phase of infection. The lowest concentration of SARS-CoV-2 viral copies this assay can detect is 250 copies / mL. A negative result does not preclude SARS-CoV-2 infection and should not be used as the sole basis for treatment or other patient management decisions.  A negative result may occur with improper specimen collection / handling, submission of specimen other than nasopharyngeal swab, presence of viral mutation(s) within the areas targeted by this assay, and inadequate number of viral copies (<250 copies / mL). A negative result must be combined with clinical observations, patient history, and epidemiological information.  Fact Sheet for Patients:   StrictlyIdeas.no  Fact Sheet for Healthcare Providers: BankingDealers.co.za  This test is not yet approved or  cleared by the [place] FDA and has been authorized for detection and/or diagnosis of SARS-CoV-2 by FDA under an Emergency Use Authorization (EUA).  This EUA will remain in effect (meaning this test can be used) for the duration of the COVID-19 declaration under Section 564(b)(1) of the Act, 21 U.S.C. section 360bbb-3(b)(1), unless the authorization is terminated or revoked sooner.  Performed at [facility], [place] [address] [place],  [address]     Radiology Reports CT HEAD WO CONTRAST  Result Date: 10/21/2019 CLINICAL DATA:  delirium EXAM: CT HEAD WITHOUT CONTRAST TECHNIQUE: Contiguous axial images were obtained from the base of the skull through the vertex without intravenous contrast. COMPARISON:  September 06, 2019 FINDINGS: Somewhat limited due to patient motion Brain: No evidence of acute territorial infarction, hemorrhage,  hydrocephalus,extra-axial collection or mass lesion/mass effect. There is dilatation the ventricles and sulci consistent with age-related atrophy. Low-attenuation changes in the deep white matter consistent with small vessel ischemia. Vascular: No hyperdense vessel or unexpected calcification. Skull: The skull is intact. No fracture or focal lesion identified. Sinuses/Orbits: The visualized paranasal sinuses and mastoid air cells are clear. The orbits and globes intact. Other: None IMPRESSION: No acute intracranial abnormality. Findings consistent with age related atrophy and chronic small vessel ischemia Electronically Signed   By: Prudencio Pair M.D.   On: 10/21/2019 01:13   CT CHEST W CONTRAST  Result Date: 10/21/2019 CLINICAL DATA:  Unresolved pneumonia with left parapneumonic effusion. Could not cul concern for underlying malignancy. Productive cough and fever. EXAM: CT CHEST WITH CONTRAST TECHNIQUE: Multidetector CT imaging of the chest was performed during intravenous contrast administration. CONTRAST:  73mL OMNIPAQUE IOHEXOL 300 MG/ML  SOLN COMPARISON:  Chest radiograph 10/20/2019 FINDINGS: Cardiovascular: Mild right heart enlargement. No pericardial effusions. Normal caliber thoracic aorta. Scattered aortic calcifications. Coronary artery calcifications. Mediastinum/Nodes: No significant mediastinal lymphadenopathy. Esophagus is mildly dilated and gas filled with decompression distally. This could indicate reflux or dysmotility. If there is a high suspicion of malignancy, could consider endoscopy to exclude an esophageal neoplasm. There is a moderate-sized hiatal hernia and elevation of the left hemidiaphragm with most of the stomach in the chest. Lungs/Pleura: Emphysematous changes in the lung apices. Atelectasis or consolidation in both lung bases, greater on the left. Small left pleural effusion. Upper Abdomen: Elevation of the left hemidiaphragm accounts for most of the density in the left lower chest  on the previous chest radiograph. Most of the stomach and the splenic flexure of the colon are included. Diverticulosis of the visualized colon. Suspicion of hepatic bile duct dilatation. Motion  artifact limits examination. Musculoskeletal: Degenerative changes throughout the thoracic spine. No destructive bone lesions appreciated. IMPRESSION: 1. Atelectasis or consolidation in both lung bases, greater on the left, with small left pleural effusion. 2. Dilated esophagus. Likely reflux or dysmotility but consider endoscopy for exclusion of focal esophageal lesion if there is high suspicion of malignancy. Moderate-sized hiatal hernia and elevation of the left hemidiaphragm with most of the stomach in the chest. 3. Suspicion of hepatic bile duct dilatation. 4. Emphysematous changes in the lung apices. 5. Colonic diverticulosis. 6. Emphysema and aortic atherosclerosis. Aortic Atherosclerosis (ICD10-I70.0) and Emphysema (ICD10-J43.9). Electronically Signed   By: Lucienne Capers M.D.   On: 10/21/2019 01:18   DG Chest Portable 1 View  Result Date: 10/20/2019 CLINICAL DATA:  Fever and shortness of breath EXAM: PORTABLE CHEST 1 VIEW COMPARISON:  September 06, 2019 FINDINGS: The heart size and mediastinal contours are unchanged with mild cardiomegaly. There is stable elevation of the left hemidiaphragm. A probable loculated small left basilar effusion is seen with airspace opacity. The right lung is clear. IMPRESSION: Elevation of the left hemidiaphragm with a small left pleural effusion and basilar opacity which could be atelectasis and/or layering effusion. Electronically Signed   By: Prudencio Pair M.D.   On: 10/20/2019 20:24    SIGNED: Deatra James, MD, FACP, FHM. [facility],  Pager (please use [email or web address] to page/text)  If 7PM-7AM, please contact night-coverage [email or web address], Password [facility] 10/22/2019, 1:10 PM

## 2019-10-23 DIAGNOSIS — J189 Pneumonia, unspecified organism: Secondary | ICD-10-CM | POA: Diagnosis not present

## 2019-10-23 DIAGNOSIS — R404 Transient alteration of awareness: Secondary | ICD-10-CM | POA: Diagnosis not present

## 2019-10-23 DIAGNOSIS — Z7401 Bed confinement status: Secondary | ICD-10-CM | POA: Diagnosis not present

## 2019-10-23 DIAGNOSIS — Z743 Need for continuous supervision: Secondary | ICD-10-CM | POA: Diagnosis not present

## 2019-10-23 DIAGNOSIS — J168 Pneumonia due to other specified infectious organisms: Secondary | ICD-10-CM | POA: Diagnosis not present

## 2019-10-23 DIAGNOSIS — M2550 Pain in unspecified joint: Secondary | ICD-10-CM | POA: Diagnosis not present

## 2019-10-23 LAB — BASIC METABOLIC PANEL
Anion gap: 10 (ref 5–15)
BUN: 10 mg/dL (ref 8–23)
CO2: 23 mmol/L (ref 22–32)
Calcium: 9.2 mg/dL (ref 8.9–10.3)
Chloride: 106 mmol/L (ref 98–111)
Creatinine, Ser: 0.93 mg/dL (ref 0.61–1.24)
GFR calc Af Amer: >60 mL/min (ref >60)
GFR calc non Af Amer: >60 mL/min (ref >60)
Glucose, Bld: 114 mg/dL — ABNORMAL HIGH (ref 70–99)
Potassium: 3.6 mmol/L (ref 3.5–5.1)
Sodium: 139 mmol/L (ref 135–145)

## 2019-10-23 LAB — CBC
HCT: 40.8 % (ref 39.0–52.0)
Hemoglobin: 13.5 g/dL (ref 13.0–17.0)
MCH: 30 pg (ref 26.0–34.0)
MCHC: 33.1 g/dL (ref 30.0–36.0)
MCV: 90.7 fL (ref 80.0–100.0)
Platelets: 168 10*3/uL (ref 150–400)
RBC: 4.5 MIL/uL (ref 4.22–5.81)
RDW: 14.4 % (ref 11.5–15.5)
WBC: 6.5 10*3/uL (ref 4.0–10.5)
nRBC: 0 % (ref 0.0–0.2)

## 2019-10-23 MED ORDER — CEFDINIR 300 MG PO CAPS
300.0000 mg | ORAL_CAPSULE | Freq: Two times a day (BID) | ORAL | 0 refills | Status: DC
Start: 2019-10-23 — End: 2019-12-17

## 2019-10-23 MED ORDER — CYANOCOBALAMIN 1000 MCG PO TABS: 1000.0000 ug | ORAL_TABLET | Freq: Every day | ORAL | Status: DC

## 2019-10-23 MED ORDER — AMLODIPINE BESYLATE 10 MG PO TABS: 10.0000 mg | ORAL_TABLET | Freq: Every day | ORAL | Status: DC

## 2019-10-23 MED ORDER — AZITHROMYCIN 500 MG PO TABS: 500.0000 mg | ORAL_TABLET | Freq: Every day | ORAL | 0 refills | Status: DC

## 2019-10-23 MED ORDER — HALOPERIDOL 2 MG PO TABS: 2.0000 mg | ORAL_TABLET | Freq: Two times a day (BID) | ORAL | Status: DC

## 2019-10-23 NOTE — Plan of Care (Signed)
  Problem: Activity: Goal: Ability to tolerate increased activity will improve Outcome: Progressing   Problem: Clinical Measurements: Goal: Ability to maintain a body temperature in the normal range will improve Outcome: Progressing   Problem: Respiratory: Goal: Ability to maintain adequate ventilation will improve Outcome: Progressing Goal: Ability to maintain a clear airway will improve Outcome: Progressing   

## 2019-10-23 NOTE — NC FL2 (Signed)
[place] LEVEL OF CARE SCREENING TOOL     IDENTIFICATION  Patient Name: Ernest Turner Birthdate: 03-19-1941 Sex: male Admission Date (Current Location): 10/20/2019  [facility] and [place] Number:  Herbalist and Address:  [facility] [place]. [facility], [place] [address] [place], [place] [address]      Provider Number: [id number]  Attending Physician Name and Address:  Edwin Dada, *  Relative Name and Phone Number:       Current Level of Care: Hospital Recommended Level of Care: [place] Prior Approval Number:    Date Approved/Denied:   PASRR Number:    Discharge Plan: Other (Comment) (ALF)    Current Diagnoses: Patient Active Problem List   Diagnosis Date Noted  . Acute metabolic encephalopathy 35/70/1779  . Parapneumonic effusion 10/21/2019  . Pneumonia of left lower lobe due to infectious organism 10/21/2019  . Lactic acidosis 10/21/2019  . Bacterial conjunctivitis of both eyes 10/21/2019  . Essential hypertension 10/21/2019  . Dementia ([place]) 08/12/2015  . Nausea & vomiting 04/04/2012  . BPH (benign prostatic hyperplasia)   . Hypogonadism male   . Allergy   . Arthritis   . Depression   . GERD (gastroesophageal reflux disease)   . Hypercholesterolemia   . Heart murmur   . Ulcer   . ED (erectile dysfunction)   . Prostate cancer ([place]) 12/26/2011    Orientation RESPIRATION BLADDER Height & Weight     Self  Normal Incontinent Weight:   Height:     BEHAVIORAL SYMPTOMS/MOOD NEUROLOGICAL BOWEL NUTRITION STATUS      Incontinent Diet  AMBULATORY STATUS COMMUNICATION OF NEEDS Skin   Extensive Assist Verbally Normal                       Personal Care Assistance Level of Assistance  Bathing, Feeding, Dressing Bathing Assistance: Maximum assistance Feeding assistance: Limited assistance Dressing Assistance: Maximum assistance     Functional Limitations Info             SPECIAL CARE FACTORS  FREQUENCY  PT (By licensed PT), OT (By licensed OT)     PT Frequency: 3x a week OT Frequency: 3x a week            Contractures      Additional Factors Info  Code Status, Allergies Code Status Info: DNR Allergies Info: Nutritional Supplements Androgel  Viagra           Current Medications (10/23/2019):  This is the current hospital active medication list Current Facility-Administered Medications  Medication Dose Route Frequency Provider Last Rate Last Admin  . acetaminophen (TYLENOL) tablet 650 mg  650 mg Oral Q4H PRN Shalhoub, Sherryll Burger, MD      . albuterol (PROVENTIL) (2.5 MG/3ML) 0.083% nebulizer solution 2.5 mg  2.5 mg Nebulization Q4H PRN Shalhoub, Sherryll Burger, MD      . amLODipine (NORVASC) tablet 10 mg  10 mg Oral Daily Shahmehdi, Seyed A, MD   10 mg at 10/23/19 0902  . azithromycin (ZITHROMAX) tablet 500 mg  500 mg Oral QHS Shahmehdi, Seyed A, MD   500 mg at 10/22/19 2211  . cefTRIAXone (ROCEPHIN) 2 g in sodium chloride 0.9 % 100 mL IVPB  2 g Intravenous Q24H Vernelle Emerald, MD 200 mL/hr at 10/22/19 2209 2 g at 10/22/19 2209  . enoxaparin (LOVENOX) injection 40 mg  40 mg Subcutaneous Daily Shalhoub, Sherryll Burger, MD   40 mg at 10/23/19 0901  .  feeding supplement (ENSURE ENLIVE) (ENSURE ENLIVE) liquid 237 mL  237 mL Oral BID BM Shahmehdi, Seyed A, MD   237 mL at 10/23/19 0902  . haloperidol (HALDOL) tablet 2 mg  2 mg Oral BID Skipper Cliche A, MD   2 mg at 10/23/19 0901  . hydrALAZINE (APRESOLINE) injection 10 mg  10 mg Intravenous Q6H PRN Shalhoub, Sherryll Burger, MD      . lactobacillus acidophilus (BACID) tablet 2 tablet  2 tablet Oral TID Deatra James, MD   2 tablet at 10/23/19 0902  . multivitamin (PROSIGHT) tablet 1 tablet  1 tablet Oral Daily Deatra James, MD   1 tablet at 10/23/19 0902  . neomycin-bacitracin-polymyxin (NEOSPORIN) ointment 1 application  1 application Topical PRN Shahmehdi, Seyed A, MD      . ondansetron (ZOFRAN) injection 4 mg  4 mg Intravenous  Q6H PRN Shalhoub, Sherryll Burger, MD      . polyethylene glycol (MIRALAX / GLYCOLAX) packet 17 g  17 g Oral Daily PRN Shalhoub, Sherryll Burger, MD      . trimethoprim-polymyxin b (POLYTRIM) ophthalmic solution 1 drop  1 drop Both Eyes Q6H Shalhoub, Sherryll Burger, MD   1 drop at 10/23/19 1205  . vitamin B-12 (CYANOCOBALAMIN) tablet 1,000 mcg  1,000 mcg Oral Daily Skipper Cliche A, MD   1,000 mcg at 10/23/19 0902     Discharge Medications: Please see discharge summary for a list of discharge medications.  Relevant Imaging Results:  Relevant Lab Results:   Additional Information SSN: [id number]  Emeterio Reeve, [place]

## 2019-10-23 NOTE — TOC Initial Note (Addendum)
Transition of Care [facility]) - Initial/Assessment Note    Patient Details  Name: Ernest Turner MRN: [id number] Date of Birth: [birth year]  [facility] CM/SW Contact:    Emeterio Reeve, [place] Phone Number: 10/23/2019, 12:59 PM  Clinical Narrative:                  CSW spoke to pts daughter Ernest Turner on the phone. Ernest Turner stated that pt has been at [facility] since May of 2021. She states pt is in the memory unit. Ernest Turner states that he was semi independent. Ernest Turner stated that the pt was walking but did need assistance with ADL's. Ernest Turner would like for pt to return to ALF but is open to SNF if needed.   PT reccommended that pt return to ALF with [facility] support and supervision, with SNF if ALF not able to provide needs.   CSW faxed FL2 and PT notes to [facility] for review.  CSW will follow.   Expected Discharge Plan: Skilled Nursing Facility Barriers to Discharge: Continued Medical Work up   Patient Goals and CMS Choice Patient states their goals for this hospitalization and ongoing recovery are:: Pt did not verablize goals CMS Medicare.gov Compare Post Acute Care list provided to:: Patient Choice offered to / list presented to : Patient  Expected Discharge Plan and Services Expected Discharge Plan: [place] arrangements for the past 2 months: [place]                                      Prior Living Arrangements/Services Living arrangements for the past 2 months: [place] Lives with:: Facility Resident Patient language and need for interpreter reviewed:: Yes Do you feel safe going back to the place where you live?: Yes      Need for Family Participation in Patient Care: Yes (Comment) Care giver support system in place?: Yes (comment)   Criminal Activity/Legal Involvement Pertinent to Current Situation/Hospitalization: No - Comment as needed  Activities of Daily Living      Permission  Sought/Granted Permission sought to share information with : Facility Sport and exercise psychologist, Family Supports Permission granted to share information with : Yes, Verbal Permission Granted  Share Information with NAME: Ernest Turner  Permission granted to share info w AGENCY: ALF/SNF/HH        Emotional Assessment Appearance:: Appears stated age Attitude/Demeanor/Rapport: Unable to Assess Affect (typically observed): Unable to Assess Orientation: : Oriented to Self Alcohol / Substance Use: Not Applicable Psych Involvement: No (comment)  Admission diagnosis:  Confusion [R41.0] Pneumonia of left lower lobe due to infectious organism [J18.9] Patient Active Problem List   Diagnosis Date Noted  . Acute metabolic encephalopathy 78/29/5621  . Parapneumonic effusion 10/21/2019  . Pneumonia of left lower lobe due to infectious organism 10/21/2019  . Lactic acidosis 10/21/2019  . Bacterial conjunctivitis of both eyes 10/21/2019  . Essential hypertension 10/21/2019  . Dementia ([place]) 08/12/2015  . Nausea & vomiting 04/04/2012  . BPH (benign prostatic hyperplasia)   . Hypogonadism male   . Allergy   . Arthritis   . Depression   . GERD (gastroesophageal reflux disease)   . Hypercholesterolemia   . Heart murmur   . Ulcer   . ED (erectile dysfunction)   . Prostate cancer ([place]) 12/26/2011   PCP:  [facility] Pharmacy:  No Pharmacies Listed  Social Determinants of Health (SDOH) Interventions    Readmission Risk Interventions No flowsheet data found.  Emeterio Reeve, Latanya Presser, [place] Social Worker [phone]

## 2019-10-23 NOTE — Discharge Summary (Addendum)
Physician Discharge Summary  DASHAWN BARTNICK DZH:[id number] DOB: 06/22/1940 DOA: 10/20/2019  PCP: Orvis Brill, [facility]  Admit date: 10/20/2019 Discharge date: 10/23/2019  Admitted From: ALF  Disposition:  ALF with [facility]   Recommendations for Outpatient Follow-up:  1. Follow up with PCP in 1 week 2. Please obtain CMP in one week to follow LFTs 3. Please obtain vitamin B12 level in 3 months 4. Monitor oral intake and if decreased, discuss goals of care regarding GI referral with family       Home Health: PT/OT due to dementia  Equipment/Devices:   Discharge Condition: Fair  CODE STATUS: DNR Diet recommendation: Regular  Brief/Interim Summary: Mr. Fieldhouse is a 79 y.o. M with hx dementia, BPH, HTN, and prostate cancer who presented for cough for few days.  In the ER, patient sluggish, low-grade temp, chest x-ray showed left lower lobe pneumonia with parapneumonic effusion.  He was started on empiric antibiotics and admitted to the hospital.     [place] DIAGNOSIS: Left lower lobe pneumonia with parapneumonic effusion    Discharge Diagnoses:  Left lower lobe pneumonia, complicated by small effusion Sepsis ruled out.  Patient was started on ceftriaxone and azithromycin.   CT chest obtained, showed small effusion, not large enough for drainage.  Patient now taking orals.  Temp < 100 F, heart rate < 100bpm, RR < 24, SpO2 at baseline.   Stable for discharge.     Acute metabolic encephalopathy superimposed on dementia Resolved  Hypertension  Dilated esophagus This was noted on CT chest as an incidental finding.  If the patient were noted to have decreased oral intake over weeks, evaluation by GI would be reasonable, depdending on discussion of goals of care between PCP and family.         Discharge Instructions  Discharge Instructions    Discharge instructions   Complete by: As directed    From Dr. Loleta Books: Patient was admitted with pneumonia He  was treated with antibiotics and stabilized He should complete 3 more days azithromycin and 4 more days cefdinir  Start new amlodipine 10 mg for hypertension Start new vitamin B12   Check B12 level in 3 months   Increase activity slowly   Complete by: As directed      Allergies as of 10/23/2019      Reactions   Nutritional Supplements Other (See Comments)   Prostate supplement (Daughter is unable to confirm)   Androgel [testosterone] Other (See Comments)   (Daughter is unable to confirm)   Viagra [sildenafil Citrate] Other (See Comments)   (Daughter is unable to confirm)      Medication List    STOP taking these medications   diclofenac sodium 1 % Gel Commonly known as: Voltaren   donepezil 10 MG tablet Commonly known as: ARICEPT   haloperidol 2 MG/ML solution Commonly known as: HALDOL Replaced by: haloperidol 2 MG tablet   memantine 10 MG tablet Commonly known as: Namenda   phenazopyridine 200 MG tablet Commonly known as: Pyridium   risperiDONE 0.25 MG tablet Commonly known as: RisperDAL   triamterene-hydrochlorothiazide 37.5-25 MG tablet Commonly known as: MAXZIDE-25     TAKE these medications   acetaminophen 500 MG tablet Commonly known as: TYLENOL Take 500 mg by mouth every 6 (six) hours as needed for fever.   alum & mag hydroxide-simeth 200-200-20 MG/5ML suspension Commonly known as: MAALOX/MYLANTA Take 30 mLs by mouth every 6 (six) hours as needed for indigestion or heartburn.   amLODipine 10 MG tablet Commonly  known as: NORVASC Take 1 tablet (10 mg total) by mouth daily. Start taking on: October 24, 2019   azithromycin 500 MG tablet Commonly known as: ZITHROMAX Take 1 tablet (500 mg total) by mouth daily.   cefdinir 300 MG capsule Commonly known as: OMNICEF Take 1 capsule (300 mg total) by mouth 2 (two) times daily.   cyanocobalamin 1000 MCG tablet Take 1 tablet (1,000 mcg total) by mouth daily. Start taking on: October 24, 2019   guaifenesin  100 MG/5ML syrup Commonly known as: ROBITUSSIN Take 200 mg by mouth every 6 (six) hours as needed for cough.   haloperidol 2 MG tablet Commonly known as: HALDOL Take 1 tablet (2 mg total) by mouth 2 (two) times daily. Replaces: haloperidol 2 MG/ML solution   loperamide 2 MG capsule Commonly known as: IMODIUM Take 2 mg by mouth as needed for diarrhea or loose stools (not to exceed 8 doses).   magnesium hydroxide 400 MG/5ML suspension Commonly known as: MILK OF MAGNESIA Take 30 mLs by mouth at bedtime as needed for mild constipation.   neomycin-bacitracin-polymyxin [phone] ointment Apply 1 application topically as needed (minor skin tears or abrasions).   PRESCRIPTION MEDICATION Apply 1 application topically 3 (three) times daily as needed (agitation/anxiety  (apply to wrist)). Lorazepam gel   sertraline 25 MG tablet Commonly known as: ZOLOFT Take 25 mg by mouth at bedtime.   tamsulosin 0.4 MG Caps capsule Commonly known as: FLOMAX Take 1 capsule (0.4 mg total) by mouth daily after breakfast. Take tamsulosin only if having difficulty voiding.       Allergies  Allergen Reactions  . Nutritional Supplements Other (See Comments)    Prostate supplement (Daughter is unable to confirm)  . Androgel [Testosterone] Other (See Comments)    (Daughter is unable to confirm)  . Viagra [Sildenafil Citrate] Other (See Comments)    (Daughter is unable to confirm)    Consultations:     Procedures/Studies: CT HEAD WO CONTRAST  Result Date: 10/21/2019 CLINICAL DATA:  delirium EXAM: CT HEAD WITHOUT CONTRAST TECHNIQUE: Contiguous axial images were obtained from the base of the skull through the vertex without intravenous contrast. COMPARISON:  September 06, 2019 FINDINGS: Somewhat limited due to patient motion Brain: No evidence of acute territorial infarction, hemorrhage, hydrocephalus,extra-axial collection or mass lesion/mass effect. There is dilatation the ventricles and sulci consistent  with age-related atrophy. Low-attenuation changes in the deep white matter consistent with small vessel ischemia. Vascular: No hyperdense vessel or unexpected calcification. Skull: The skull is intact. No fracture or focal lesion identified. Sinuses/Orbits: The visualized paranasal sinuses and mastoid air cells are clear. The orbits and globes intact. Other: None IMPRESSION: No acute intracranial abnormality. Findings consistent with age related atrophy and chronic small vessel ischemia Electronically Signed   By: Prudencio Pair M.D.   On: 10/21/2019 01:13   CT CHEST W CONTRAST  Result Date: 10/21/2019 CLINICAL DATA:  Unresolved pneumonia with left parapneumonic effusion. Could not cul concern for underlying malignancy. Productive cough and fever. EXAM: CT CHEST WITH CONTRAST TECHNIQUE: Multidetector CT imaging of the chest was performed during intravenous contrast administration. CONTRAST:  68mL OMNIPAQUE IOHEXOL 300 MG/ML  SOLN COMPARISON:  Chest radiograph 10/20/2019 FINDINGS: Cardiovascular: Mild right heart enlargement. No pericardial effusions. Normal caliber thoracic aorta. Scattered aortic calcifications. Coronary artery calcifications. Mediastinum/Nodes: No significant mediastinal lymphadenopathy. Esophagus is mildly dilated and gas filled with decompression distally. This could indicate reflux or dysmotility. If there is a high suspicion of malignancy, could consider endoscopy to exclude an  esophageal neoplasm. There is a moderate-sized hiatal hernia and elevation of the left hemidiaphragm with most of the stomach in the chest. Lungs/Pleura: Emphysematous changes in the lung apices. Atelectasis or consolidation in both lung bases, greater on the left. Small left pleural effusion. Upper Abdomen: Elevation of the left hemidiaphragm accounts for most of the density in the left lower chest on the previous chest radiograph. Most of the stomach and the splenic flexure of the colon are included. Diverticulosis  of the visualized colon. Suspicion of hepatic bile duct dilatation. Motion artifact limits examination. Musculoskeletal: Degenerative changes throughout the thoracic spine. No destructive bone lesions appreciated. IMPRESSION: 1. Atelectasis or consolidation in both lung bases, greater on the left, with small left pleural effusion. 2. Dilated esophagus. Likely reflux or dysmotility but consider endoscopy for exclusion of focal esophageal lesion if there is high suspicion of malignancy. Moderate-sized hiatal hernia and elevation of the left hemidiaphragm with most of the stomach in the chest. 3. Suspicion of hepatic bile duct dilatation. 4. Emphysematous changes in the lung apices. 5. Colonic diverticulosis. 6. Emphysema and aortic atherosclerosis. Aortic Atherosclerosis (ICD10-I70.0) and Emphysema (ICD10-J43.9). Electronically Signed   By: Lucienne Capers M.D.   On: 10/21/2019 01:18   DG Chest Portable 1 View  Result Date: 10/20/2019 CLINICAL DATA:  Fever and shortness of breath EXAM: PORTABLE CHEST 1 VIEW COMPARISON:  September 06, 2019 FINDINGS: The heart size and mediastinal contours are unchanged with mild cardiomegaly. There is stable elevation of the left hemidiaphragm. A probable loculated small left basilar effusion is seen with airspace opacity. The right lung is clear. IMPRESSION: Elevation of the left hemidiaphragm with a small left pleural effusion and basilar opacity which could be atelectasis and/or layering effusion. Electronically Signed   By: Prudencio Pair M.D.   On: 10/20/2019 20:24      Subjective: Patient without complaints.  No fever, abdominal pain, chest pain.  No vomiting.  Discharge Exam: Vitals:   10/23/19 0803 10/23/19 1550  BP: (!) 165/73 (!) 144/82  Pulse: (!) 101 78  Resp: 16 16  Temp: 98.2 F (36.8 C) 98.2 F (36.8 C)  SpO2:     Vitals:   10/22/19 2100 10/22/19 2225 10/23/19 0803 10/23/19 1550  BP:  125/89 (!) 165/73 (!) 144/82  Pulse:  80 (!) 101 78  Resp: 17 18 16  16   Temp:  98 F (36.7 C) 98.2 F (36.8 C) 98.2 F (36.8 C)  TempSrc:  Oral    SpO2:  100%      General: Pt is sleeping, rouses easily, awake, not in acute distress Cardiovascular: RRR, nl S1-S2, no murmurs appreciated.   No LE edema.   Respiratory: Normal respiratory rate and rhythm.  CTAB without rales or wheezes. Abdominal: Abdomen soft and non-tender.  No distension or HSM.   Neuro/Psych: Strength symmetric in upper and lower extremities.  Judgment and insight appear severely impaired.   The results of significant diagnostics from this hospitalization (including imaging, microbiology, ancillary and laboratory) are listed below for reference.     Microbiology: Recent Results (from the past 240 hour(s))  SARS Coronavirus 2 by RT PCR (hospital order, performed in [facility] hospital lab) Nasopharyngeal Nasopharyngeal Swab     Status: None   Collection Time: 10/20/19  8:04 PM   Specimen: Nasopharyngeal Swab  Result Value Ref Range Status   SARS Coronavirus 2 NEGATIVE NEGATIVE Final    Comment: (NOTE) SARS-CoV-2 target nucleic acids are NOT DETECTED.  The SARS-CoV-2 RNA is generally detectable  in upper and lower respiratory specimens during the acute phase of infection. The lowest concentration of SARS-CoV-2 viral copies this assay can detect is 250 copies / mL. A negative result does not preclude SARS-CoV-2 infection and should not be used as the sole basis for treatment or other patient management decisions.  A negative result may occur with improper specimen collection / handling, submission of specimen other than nasopharyngeal swab, presence of viral mutation(s) within the areas targeted by this assay, and inadequate number of viral copies (<250 copies / mL). A negative result must be combined with clinical observations, patient history, and epidemiological information.  Fact Sheet for Patients:   StrictlyIdeas.no  Fact Sheet for Healthcare  Providers: BankingDealers.co.za  This test is not yet approved or  cleared by the [place] FDA and has been authorized for detection and/or diagnosis of SARS-CoV-2 by FDA under an Emergency Use Authorization (EUA).  This EUA will remain in effect (meaning this test can be used) for the duration of the COVID-19 declaration under Section 564(b)(1) of the Act, 21 U.S.C. section 360bbb-3(b)(1), unless the authorization is terminated or revoked sooner.  Performed at [facility], [place] [address], [place], [place] [address]   Culture, blood (routine x 2)     Status: None (Preliminary result)   Collection Time: 10/21/19  4:35 AM   Specimen: BLOOD RIGHT HAND  Result Value Ref Range Status   Specimen Description BLOOD RIGHT HAND  Final   Special Requests   Final    BOTTLES DRAWN AEROBIC AND ANAEROBIC Blood Culture results may not be optimal due to an inadequate volume of blood received in culture bottles   Culture   Final    NO GROWTH 2 DAYS Performed at [facility], [place] [address], [place], [place] [address]    Report Status PENDING  Incomplete     Labs: BNP (last 3 results) No results for input(s): BNP in the last 8760 hours. Basic Metabolic Panel: Recent Labs  Lab 10/20/19 2005 10/21/19 0435 10/22/19 0117 10/23/19 0134  NA 140 139 141 139  K 4.3 3.9 3.5 3.6  CL 105 105 104 106  CO2 24 25 23 23   GLUCOSE 118* 110* 86 114*  BUN 14 13 9 10   CREATININE 1.34* 1.11 1.05 0.93  CALCIUM 9.7 9.1 8.9 9.2   Liver Function Tests: Recent Labs  Lab 10/20/19 2005 10/21/19 0435  AST 34 116*  ALT 18 46*  ALKPHOS 63 66  BILITOT 1.5* 1.4*  PROT 7.0 6.5  ALBUMIN 3.7 3.2*   No results for input(s): LIPASE, AMYLASE in the last 168 hours. No results for input(s): AMMONIA in the last 168 hours. CBC: Recent Labs  Lab 10/20/19 2003 10/21/19 0435 10/22/19 0117 10/23/19 0134  WBC 9.4 6.6 6.3 6.5  NEUTROABS 7.1 4.8  --   --   HGB 14.2 12.8* 12.4*  13.5  HCT 44.4 40.4 38.1* 40.8  MCV 95.5 94.6 93.4 90.7  PLT 145* PLATELET CLUMPS NOTED ON SMEAR, COUNT APPEARS ADEQUATE 115* 168   Cardiac Enzymes: No results for input(s): CKTOTAL, CKMB, CKMBINDEX, TROPONINI in the last 168 hours. BNP: Invalid input(s): POCBNP CBG: No results for input(s): GLUCAP in the last 168 hours. D-Dimer No results for input(s): DDIMER in the last 72 hours. Hgb A1c No results for input(s): HGBA1C in the last 72 hours. Lipid Profile No results for input(s): CHOL, HDL, LDLCALC, TRIG, CHOLHDL, LDLDIRECT in the last 72 hours. Thyroid function studies Recent Labs  10/21/19 0100  TSH 0.439   Anemia work up Recent Labs    10/21/19 0027 10/21/19 0100  VITAMINB12 149*  --   FOLATE  --  15.0   Urinalysis    Component Value Date/Time   COLORURINE YELLOW 09/06/2019 0932   APPEARANCEUR CLEAR 09/06/2019 0932   LABSPEC 1.016 09/06/2019 0932   PHURINE 5.0 09/06/2019 0932   GLUCOSEU NEGATIVE 09/06/2019 0932   HGBUR SMALL (A) 09/06/2019 0932   BILIRUBINUR NEGATIVE 09/06/2019 0932   KETONESUR NEGATIVE 09/06/2019 0932   PROTEINUR NEGATIVE 09/06/2019 0932   UROBILINOGEN 0.2 08/08/2014 1357   NITRITE NEGATIVE 09/06/2019 0932   LEUKOCYTESUR NEGATIVE 09/06/2019 0932   Sepsis Labs Invalid input(s): PROCALCITONIN,  WBC,  LACTICIDVEN Microbiology Recent Results (from the past 240 hour(s))  SARS Coronavirus 2 by RT PCR (hospital order, performed in [place] hospital lab) Nasopharyngeal Nasopharyngeal Swab     Status: None   Collection Time: 10/20/19  8:04 PM   Specimen: Nasopharyngeal Swab  Result Value Ref Range Status   SARS Coronavirus 2 NEGATIVE NEGATIVE Final    Comment: (NOTE) SARS-CoV-2 target nucleic acids are NOT DETECTED.  The SARS-CoV-2 RNA is generally detectable in upper and lower respiratory specimens during the acute phase of infection. The lowest concentration of SARS-CoV-2 viral copies this assay can detect is 250 copies / mL. A  negative result does not preclude SARS-CoV-2 infection and should not be used as the sole basis for treatment or other patient management decisions.  A negative result may occur with improper specimen collection / handling, submission of specimen other than nasopharyngeal swab, presence of viral mutation(s) within the areas targeted by this assay, and inadequate number of viral copies (<250 copies / mL). A negative result must be combined with clinical observations, patient history, and epidemiological information.  Fact Sheet for Patients:   StrictlyIdeas.no  Fact Sheet for Healthcare Providers: BankingDealers.co.za  This test is not yet approved or  cleared by the [place] FDA and has been authorized for detection and/or diagnosis of SARS-CoV-2 by FDA under an Emergency Use Authorization (EUA).  This EUA will remain in effect (meaning this test can be used) for the duration of the COVID-19 declaration under Section 564(b)(1) of the Act, 21 U.S.C. section 360bbb-3(b)(1), unless the authorization is terminated or revoked sooner.  Performed at [facility], [place] [address] [place], [place] [address]   Culture, blood (routine x 2)     Status: None (Preliminary result)   Collection Time: 10/21/19  4:35 AM   Specimen: BLOOD RIGHT HAND  Result Value Ref Range Status   Specimen Description BLOOD RIGHT HAND  Final   Special Requests   Final    BOTTLES DRAWN AEROBIC AND ANAEROBIC Blood Culture results may not be optimal due to an inadequate volume of blood received in culture bottles   Culture   Final    NO GROWTH 2 DAYS Performed at [facility], [place] [address], [place],  [address]    Report Status PENDING  Incomplete     Time coordinating discharge: 35 minutes      SIGNED:   Edwin Dada, MD  [facility] 10/23/2019, 3:54 PM

## 2019-10-23 NOTE — TOC Transition Note (Signed)
Transition of Care [facility]) - CM/SW Discharge Note   Patient Details  Name: Ernest Turner MRN: [id number] Date of Birth: 1940/11/08  [facility]) CM/SW Contact:  Emeterio Reeve, [place] Phone Number: 10/23/2019, 3:55 PM   Clinical Narrative:     Pt will d/c back to [facility] via [place]. Pts daughter [name] was informed of transfer. CSW faxed Signed FL2 and d/c summary to ALF.    Nurse to call report to [phone].  Final next level of care: Assisted Living Barriers to Discharge: Barriers Resolved   Patient Goals and CMS Choice Patient states their goals for this hospitalization and ongoing recovery are:: Pt did not verablize goals CMS Medicare.gov Compare Post Acute Care list provided to:: Patient Choice offered to / list presented to : Patient  Discharge Placement              Patient chooses bed at: [facility] Patient to be transferred to facility by: [place] Name of family member notified: [name] Patient and family notified of of transfer: 10/23/19  Discharge Plan and Services                          HH Arranged: PT, OT [place] Agency: [facility] [employer] Date [place]: 10/23/19 Time [place]: [place] Representative spoke with at [place]: [place] ([place]) Interventions     Readmission Risk Interventions No flowsheet data found.  Emeterio Reeve, Latanya Presser, [place] Social Worker [phone]

## 2019-10-23 NOTE — Evaluation (Signed)
Physical Therapy Evaluation Patient Details Name: Ernest Turner MRN: [id number] DOB: 1940-05-12 Today's Date: 10/23/2019   History of Present Illness  Pt is a 79 y/o male admitted from memory care unit secondary to L lower lobe PNA. PMH includes HTN, dementia, and GERD.   Clinical Impression  Pt admitted secondary to problem above with deficits below. Unsure of baseline mobility as pt with cognitive deficits. Pt resistive during mobility tasks and requiring total A +2. Was able to maintain sitting balance with BUE support and occasional min A. Unable to achieve full standing secondary to resistiveness. Feel pt will likely perform better in his familiar ALF environment, however, will need to ensure they can provide necessary assist. If they are unable to provide necessary assist, pt will likely require SNF. Will continue to follow acutely to maximize functional mobility independence and safety.     Follow Up Recommendations Supervision/Assistance - 24 hour (HHPT at ALF vs SNF )    Equipment Recommendations  None recommended by PT    Recommendations for Other Services       Precautions / Restrictions Precautions Precautions: Fall Restrictions Weight Bearing Restrictions: No      Mobility  Bed Mobility Overal bed mobility: Needs Assistance Bed Mobility: Supine to Sit;Sit to Supine     Supine to sit: Total assist;+2 for physical assistance Sit to supine: Total assist;+2 for physical assistance   General bed mobility comments: Pt slightly resistive and unable to follow commands. total A +2 for BLE and trunk assist.   Transfers Overall transfer level: Needs assistance Equipment used: 2 person hand held assist Transfers: Sit to/from Stand Sit to Stand: Total assist;+2 physical assistance         General transfer comment: Pt resistive to standing and only able to achieve partial stand.   Ambulation/Gait                Stairs            Wheelchair Mobility     Modified Rankin (Stroke Patients Only)       Balance Overall balance assessment: Needs assistance Sitting-balance support: Feet supported;Bilateral upper extremity supported Sitting balance-Leahy Scale: Poor Sitting balance - Comments: Reliant on UE and at times min A for sitting balance                                     Pertinent Vitals/Pain Pain Assessment: Faces Faces Pain Scale: No hurt    Home Living Family/patient expects to be discharged to:: Assisted living                 Additional Comments: From memory care    Prior Function           Comments: Unsure of PLOF as pt unable to report secondary to cognitive deficits.      Hand Dominance        Extremity/Trunk Assessment   Upper Extremity Assessment Upper Extremity Assessment: Generalized weakness    Lower Extremity Assessment Lower Extremity Assessment: Generalized weakness;Difficult to assess due to impaired cognition    Cervical / Trunk Assessment Cervical / Trunk Assessment: Kyphotic  Communication   Communication: No difficulties  Cognition Arousal/Alertness: Awake/alert Behavior During Therapy: WFL for tasks assessed/performed Overall Cognitive Status: History of cognitive impairments - at baseline  General Comments: Pt very pleasantly confused. Would talk on tangents unrelated to questions asked.       General Comments      Exercises     Assessment/Plan    PT Assessment Patient needs continued PT services  PT Problem List Decreased strength;Decreased balance;Decreased mobility;Decreased cognition;Decreased knowledge of use of DME;Decreased safety awareness;Decreased knowledge of precautions;Decreased activity tolerance       PT Treatment Interventions DME instruction;Gait training;Functional mobility training;Therapeutic activities;Therapeutic exercise;Balance training;Patient/family education;Cognitive  remediation    PT Goals (Current goals can be found in the Care Plan section)  Acute Rehab PT Goals PT Goal Formulation: Patient unable to participate in goal setting Time For Goal Achievement: 11/06/19 Potential to Achieve Goals: Fair    Frequency Min 3X/week   Barriers to discharge        Co-evaluation               AM-PAC PT "6 Clicks" Mobility  Outcome Measure Help needed turning from your back to your side while in a flat bed without using bedrails?: Total Help needed moving from lying on your back to sitting on the side of a flat bed without using bedrails?: Total Help needed moving to and from a bed to a chair (including a wheelchair)?: Total Help needed standing up from a chair using your arms (e.g., wheelchair or bedside chair)?: Total Help needed to walk in hospital room?: Total Help needed climbing 3-5 steps with a railing? : Total 6 Click Score: 6    End of Session Equipment Utilized During Treatment: Gait belt Activity Tolerance: Patient tolerated treatment well Patient left: in bed;with call bell/phone within reach;with bed alarm set Nurse Communication: Mobility status PT Visit Diagnosis: Unsteadiness on feet (R26.81);Muscle weakness (generalized) (M62.81)    Time: 4128-7867 PT Time Calculation (min) (ACUTE ONLY): 19 min   Charges:   PT Evaluation $PT Eval Moderate Complexity: 1 Mod          Reuel Derby, PT, DPT  Acute Rehabilitation Services  Pager: [phone] Office: [phone]   Rudean Hitt 10/23/2019, 1:15 PM

## 2019-10-23 NOTE — Plan of Care (Signed)
Pt met all care plan goals. He is adequate for discharge.  Problem: Activity: Goal: Ability to tolerate increased activity will improve Outcome: Adequate for Discharge   Problem: Clinical Measurements: Goal: Ability to maintain a body temperature in the normal range will improve Outcome: Adequate for Discharge   Problem: Respiratory: Goal: Ability to maintain adequate ventilation will improve Outcome: Adequate for Discharge Goal: Ability to maintain a clear airway will improve Outcome: Adequate for Discharge

## 2019-10-23 NOTE — Progress Notes (Signed)
RN called [facility] and gave report to [employer] in memory unit.

## 2019-10-25 ENCOUNTER — Emergency Department (HOSPITAL_COMMUNITY)
Admission: EM | Admit: 2019-10-25 | Discharge: 2019-10-25 | Disposition: A | Discharge status: Home / Self Care | Payer: Medicare Other | Attending: Emergency Medicine | Admitting: Emergency Medicine

## 2019-10-25 ENCOUNTER — Emergency Department (HOSPITAL_COMMUNITY): Payer: Medicare Other

## 2019-10-25 ENCOUNTER — Encounter (HOSPITAL_COMMUNITY): Payer: Self-pay | Admitting: Emergency Medicine

## 2019-10-25 DIAGNOSIS — F0390 Unspecified dementia without behavioral disturbance: Secondary | ICD-10-CM | POA: Insufficient documentation

## 2019-10-25 DIAGNOSIS — W19XXXA Unspecified fall, initial encounter: Secondary | ICD-10-CM | POA: Insufficient documentation

## 2019-10-25 DIAGNOSIS — S0083XA Contusion of other part of head, initial encounter: Secondary | ICD-10-CM | POA: Insufficient documentation

## 2019-10-25 DIAGNOSIS — I10 Essential (primary) hypertension: Secondary | ICD-10-CM | POA: Diagnosis not present

## 2019-10-25 DIAGNOSIS — S199XXA Unspecified injury of neck, initial encounter: Secondary | ICD-10-CM | POA: Diagnosis not present

## 2019-10-25 DIAGNOSIS — R0902 Hypoxemia: Secondary | ICD-10-CM | POA: Diagnosis not present

## 2019-10-25 DIAGNOSIS — Y999 Unspecified external cause status: Secondary | ICD-10-CM | POA: Insufficient documentation

## 2019-10-25 DIAGNOSIS — Y929 Unspecified place or not applicable: Secondary | ICD-10-CM | POA: Diagnosis not present

## 2019-10-25 DIAGNOSIS — J320 Chronic maxillary sinusitis: Secondary | ICD-10-CM | POA: Diagnosis not present

## 2019-10-25 DIAGNOSIS — R410 Disorientation, unspecified: Secondary | ICD-10-CM | POA: Diagnosis not present

## 2019-10-25 DIAGNOSIS — Z743 Need for continuous supervision: Secondary | ICD-10-CM | POA: Diagnosis not present

## 2019-10-25 DIAGNOSIS — S0990XA Unspecified injury of head, initial encounter: Secondary | ICD-10-CM | POA: Diagnosis present

## 2019-10-25 DIAGNOSIS — Z79899 Other long term (current) drug therapy: Secondary | ICD-10-CM | POA: Insufficient documentation

## 2019-10-25 DIAGNOSIS — Y939 Activity, unspecified: Secondary | ICD-10-CM | POA: Diagnosis not present

## 2019-10-25 DIAGNOSIS — M952 Other acquired deformity of head: Secondary | ICD-10-CM | POA: Diagnosis not present

## 2019-10-25 DIAGNOSIS — Z87891 Personal history of nicotine dependence: Secondary | ICD-10-CM | POA: Insufficient documentation

## 2019-10-25 DIAGNOSIS — R279 Unspecified lack of coordination: Secondary | ICD-10-CM | POA: Diagnosis not present

## 2019-10-25 DIAGNOSIS — J323 Chronic sphenoidal sinusitis: Secondary | ICD-10-CM | POA: Diagnosis not present

## 2019-10-25 DIAGNOSIS — Z8546 Personal history of malignant neoplasm of prostate: Secondary | ICD-10-CM | POA: Diagnosis not present

## 2019-10-25 DIAGNOSIS — J3489 Other specified disorders of nose and nasal sinuses: Secondary | ICD-10-CM | POA: Diagnosis not present

## 2019-10-25 NOTE — ED Notes (Signed)
[facility] called for transport.  

## 2019-10-25 NOTE — ED Triage Notes (Signed)
Per EMS, patient from [facility], staff reports unwitnessed fall. Hematoma to right forehead. No blood thinners. Hx dementia.

## 2019-10-25 NOTE — ED Provider Notes (Signed)
[facility] DEPT Provider Note   CSN: [id number] Arrival date & time: 10/25/19  1551     History Chief Complaint  Patient presents with  . Fall    Ernest Turner is a 79 y.o. male.  HPI Level 5 caveat due to dementia. Patient reported comes in [place] after an unwitnessed fall.  Hematoma small laceration to right periorbital area.  Not on blood thinners.  Patient without complaints at this time.  Sent into the ER for further evaluation.    Past Medical History:  Diagnosis Date  . Arthritis   . BPH (benign prostatic hyperplasia)   . Depression   . ED (erectile dysfunction)   . Full dentures   . Heart murmur   . Hemorrhoids   . History of peptic ulcer   . History of radiation therapy to prostate 7800 cGy 40 sessions 04-11-2012 to 06-11-2012   EXTERNAL BEAM PELVIC AREA FOR PROSTATE CANCER  . History of urinary retention   . HOH (hard of hearing)   . Hyperlipidemia   . Hypertension   . Hypogonadism male   . Lower urinary tract symptoms (LUTS)   . Memory difficulty   . Memory loss   . Poor historian   . Prostate cancer [facility]) dx 12/26/11  urologist-  dr Gaynelle Arabian  oncologist-  dr Valere Dross   GOLD SEED IMPLANT  03-07-2012--- Adenocarcinoma,gleason=3+3=6.,& 3+4=7,PSA=15.84--  s/p  external beam radiation 04-11-2012 to 06-11-2012  . Wears glasses     Patient Active Problem List   Diagnosis Date Noted  . Acute metabolic encephalopathy 42/70/6237  . Parapneumonic effusion 10/21/2019  . Pneumonia of left lower lobe due to infectious organism 10/21/2019  . Lactic acidosis 10/21/2019  . Bacterial conjunctivitis of both eyes 10/21/2019  . Essential hypertension 10/21/2019  . Dementia ([place]) 08/12/2015  . Nausea & vomiting 04/04/2012  . BPH (benign prostatic hyperplasia)   . Hypogonadism male   . Allergy   . Arthritis   . Depression   . GERD (gastroesophageal reflux disease)   . Hypercholesterolemia   . Heart murmur   . Ulcer   .  ED (erectile dysfunction)   . Prostate cancer ([place]) 12/26/2011    Past Surgical History:  Procedure Laterality Date  . CIRCUMCISION  09-21-2006  . COLONOSCOPY    . CYSTOSCOPY Right 10/16/2014   Procedure: CYSTO, RIGHT RETROGRADE COOK BALLOON DILATION RIGHT PROXIMAL URETHERAL STRICTURE;  Surgeon: Carolan Clines, MD;  Location: [facility];  Service: Urology;  Laterality: Right;  . INGUINAL HERNIA REPAIR Left 04/29/2014   Procedure: LEFT INGUINAL HERNIA REPAIR;  Surgeon: Coralie Keens, MD;  Location: [facility];  Service: General;  Laterality: Left;  . INSERTION OF MESH Left 04/29/2014   Procedure: INSERTION OF MESH;  Surgeon: Coralie Keens, MD;  Location: [facility];  Service: General;  Laterality: Left;  . MOUTH SURGERY     gum surgery  . MULTIPLE TOOTH EXTRACTIONS    . PROSTATE BIOPSY  12/26/11   PSA=15.84,gleason=3+3=6,& 3+4=7,Volume=27.98cc,Adenocarcinoma       Family History  Problem Relation Age of Onset  . Cancer Sister        liver to brain mets died late 72's  . Cancer Daughter        breast cancer died age 77  . Diabetes Mother     Social History   Tobacco Use  . Smoking status: Former Smoker    Packs/day: 2.00    Years: 20.00    Pack years: 40.00  Types: Cigarettes    Quit date: 01/21/1984    Years since quitting: 35.7  . Smokeless tobacco: Never Used  Substance Use Topics  . Alcohol use: No  . Drug use: No    Home Medications Prior to Admission medications   Medication Sig Start Date End Date Taking? Authorizing Provider  acetaminophen (TYLENOL) 500 MG tablet Take 500 mg by mouth every 6 (six) hours as needed for fever.    [provider]  alum & mag hydroxide-simeth (MAALOX/MYLANTA) 200-200-20 MG/5ML suspension Take 30 mLs by mouth every 6 (six) hours as needed for indigestion or heartburn.    [provider]  amLODipine (NORVASC) 10 MG tablet Take 1 tablet (10 mg total) by mouth daily. 10/24/19   Danford, Suann Larry, MD    azithromycin (ZITHROMAX) 500 MG tablet Take 1 tablet (500 mg total) by mouth daily. 10/23/19   Danford, Suann Larry, MD  cefdinir (OMNICEF) 300 MG capsule Take 1 capsule (300 mg total) by mouth 2 (two) times daily. 10/23/19   Danford, Suann Larry, MD  guaifenesin (ROBITUSSIN) 100 MG/5ML syrup Take 200 mg by mouth every 6 (six) hours as needed for cough.    [provider]  haloperidol (HALDOL) 2 MG tablet Take 1 tablet (2 mg total) by mouth 2 (two) times daily. 10/23/19   Danford, Suann Larry, MD  loperamide (IMODIUM) 2 MG capsule Take 2 mg by mouth as needed for diarrhea or loose stools (not to exceed 8 doses).    [provider]  magnesium hydroxide (MILK OF MAGNESIA) 400 MG/5ML suspension Take 30 mLs by mouth at bedtime as needed for mild constipation.    [provider]  neomycin-bacitracin-polymyxin (NEOSPORIN) [phone] ointment Apply 1 application topically as needed (minor skin tears or abrasions).    [provider]  PRESCRIPTION MEDICATION Apply 1 application topically 3 (three) times daily as needed (agitation/anxiety  (apply to wrist)). Lorazepam gel    [provider]  sertraline (ZOLOFT) 25 MG tablet Take 25 mg by mouth at bedtime.    [provider]  tamsulosin (FLOMAX) 0.4 MG CAPS capsule Take 1 capsule (0.4 mg total) by mouth daily after breakfast. Take tamsulosin only if having difficulty voiding. 10/16/14   Carolan Clines, MD  vitamin B-12 1000 MCG tablet Take 1 tablet (1,000 mcg total) by mouth daily. 10/24/19   Danford, Suann Larry, MD    Allergies    Nutritional supplements, Androgel [testosterone], and Viagra [sildenafil citrate]  Review of Systems   Review of Systems  Unable to perform ROS: Dementia    Physical Exam Updated Vital Signs BP (!) 144/101   Pulse 68   Temp 97.6 F (36.4 C) (Axillary)   Resp 17   SpO2 98%   Physical Exam Vitals and nursing note reviewed.  HENT:     Head:     Comments:  Patient with small hematoma to right periorbital area.  Eye movements intact.  Well approximated superficial laceration approximately 5 mm long. Eyes:     Extraocular Movements: Extraocular movements intact.  Cardiovascular:     Rate and Rhythm: Regular rhythm.  Pulmonary:     Breath sounds: Normal breath sounds.  Abdominal:     Tenderness: There is no abdominal tenderness.  Musculoskeletal:        General: No tenderness.     Cervical back: Neck supple. No tenderness.     Comments: No tenderness on chest or upper or lower extremities.  Skin:    General: Skin is warm.  Neurological:     Mental Status: He is alert. Mental status is at baseline.     ED Results / Procedures / Treatments   Labs (all labs ordered are listed, but only abnormal results are displayed) Labs Reviewed - No data to display  EKG None  Radiology CT Head Wo Contrast  Result Date: 10/25/2019 CLINICAL DATA:  Status post fall. EXAM: CT HEAD WITHOUT CONTRAST TECHNIQUE: Contiguous axial images were obtained from the base of the skull through the vertex without intravenous contrast. COMPARISON:  October 21, 2019 FINDINGS: Brain: There is mild cerebral atrophy with widening of the extra-axial spaces and ventricular dilatation. There are areas of decreased attenuation within the white matter tracts of the supratentorial brain, consistent with microvascular disease changes Vascular: No hyperdense vessel or unexpected calcification. Skull: Normal. Negative for fracture or focal lesion. Sinuses/Orbits: There is marked severity bilateral ethmoid sinus mucosal thickening. Mild sphenoid sinus and mild right maxillary sinus mucosal thickening is also seen. Other: Mild-to-moderate severity lateral right periorbital soft tissue swelling is seen. It should be noted that the study is limited secondary to patient motion. IMPRESSION: 1. Mild-to-moderate severity lateral right periorbital soft tissue swelling. 2. No acute intracranial  abnormality. 3. Paranasal sinus disease. Electronically Signed   By: Virgina Norfolk M.D.   On: 10/25/2019 17:34   CT Cervical Spine Wo Contrast  Result Date: 10/25/2019 CLINICAL DATA:  Status post fall. EXAM: CT CERVICAL SPINE WITHOUT CONTRAST TECHNIQUE: Multidetector CT imaging of the cervical spine was performed without intravenous contrast. Multiplanar CT image reconstructions were also generated. COMPARISON:  None. FINDINGS: Alignment: Normal. Skull base and vertebrae: No acute fracture. Congenital nonunion of the posterior arch of the C1 vertebral body is seen. No primary bone lesion or focal pathologic process. Soft tissues and spinal canal: No prevertebral fluid or swelling. No visible canal hematoma. Disc levels: There is marked severity narrowing of the anterior atlantoaxial articulation. Moderate severity endplate sclerosis is seen at the levels of C3-C4, C4-C5, C5-C6 and C6-C7. Moderate to marked severity intervertebral disc space narrowing is also seen at these levels. There is mild bilateral multilevel facet joint hypertrophy. Upper chest: Negative. Other: None. IMPRESSION: 1. No acute osseous abnormality. 2. Moderate to marked severity multilevel degenerative disc disease and facet joint hypertrophy. Electronically Signed   By: Virgina Norfolk M.D.   On: 10/25/2019 17:40   CT Maxillofacial Wo Contrast  Result Date: 10/25/2019 CLINICAL DATA:  Status post trauma. EXAM: CT MAXILLOFACIAL WITHOUT CONTRAST TECHNIQUE: Multidetector CT imaging of the maxillofacial structures was performed. Multiplanar CT image reconstructions were also generated. COMPARISON:  None. FINDINGS: It should be noted that the study is limited secondary to patient motion. Osseous: A chronic deformity of the medial wall of the right orbit is noted. Orbits: Negative. No traumatic or inflammatory finding. Sinuses: There is marked severity bilateral ethmoid sinus mucosal thickening. Mild bilateral maxillary sinus and sphenoid  sinus mucosal thickening is also seen. Soft tissues: There is mild to moderate severity lateral right periorbital soft tissue swelling. Limited intracranial: The visualized brain parenchyma is remarkable for the presence of generalized cerebral atrophy. IMPRESSION: 1. Mild to moderate severity lateral right periorbital soft tissue swelling without evidence of an acute fracture or dislocation. 2. Marked severity bilateral ethmoid sinus mucosal thickening. 3. Mild bilateral maxillary sinus and sphenoid sinus mucosal thickening. 4. Chronic deformity of the medial wall of the right orbit. Electronically Signed   By: Virgina Norfolk M.D.   On: 10/25/2019 17:45    Procedures Procedures (including  critical care time)  Medications Ordered in ED Medications - No data to display  ED Course  I have reviewed the triage vital signs and the nursing notes.  Pertinent labs & imaging results that were available during my care of the patient were reviewed by me and considered in my medical decision making (see chart for details).    MDM Rules/Calculators/A&P                          Patient with fall.  Hematoma to right periorbital area.  CT scan reassuring.  Laceration does not appear to need closing.  No other apparent injury.  Discharge home. Final Clinical Impression(s) / ED Diagnoses Final diagnoses:  Fall, initial encounter  Contusion of face, initial encounter    Rx / DC Orders ED Discharge Orders    None       Davonna Belling, MD 10/25/19 2349

## 2019-10-25 NOTE — ED Notes (Signed)
Family at bedside. 

## 2019-10-25 NOTE — ED Notes (Signed)
Patient's daughter, [name], requesting call at time of discharge. [phone]

## 2019-10-26 LAB — CULTURE, BLOOD (ROUTINE X 2): Culture: NO GROWTH

## 2019-10-28 DIAGNOSIS — R2689 Other abnormalities of gait and mobility: Secondary | ICD-10-CM | POA: Diagnosis not present

## 2019-10-28 DIAGNOSIS — R296 Repeated falls: Secondary | ICD-10-CM | POA: Diagnosis not present

## 2019-10-28 DIAGNOSIS — R2681 Unsteadiness on feet: Secondary | ICD-10-CM | POA: Diagnosis not present

## 2019-10-28 DIAGNOSIS — G301 Alzheimer's disease with late onset: Secondary | ICD-10-CM | POA: Diagnosis not present

## 2019-10-30 DIAGNOSIS — D649 Anemia, unspecified: Secondary | ICD-10-CM | POA: Diagnosis not present

## 2019-10-30 DIAGNOSIS — R2689 Other abnormalities of gait and mobility: Secondary | ICD-10-CM | POA: Diagnosis not present

## 2019-10-30 DIAGNOSIS — R2681 Unsteadiness on feet: Secondary | ICD-10-CM | POA: Diagnosis not present

## 2019-10-30 DIAGNOSIS — R7889 Finding of other specified substances, not normally found in blood: Secondary | ICD-10-CM | POA: Diagnosis not present

## 2019-10-30 DIAGNOSIS — R296 Repeated falls: Secondary | ICD-10-CM | POA: Diagnosis not present

## 2019-10-30 DIAGNOSIS — R7989 Other specified abnormal findings of blood chemistry: Secondary | ICD-10-CM | POA: Diagnosis not present

## 2019-11-01 DIAGNOSIS — M6281 Muscle weakness (generalized): Secondary | ICD-10-CM | POA: Diagnosis not present

## 2019-11-01 DIAGNOSIS — R293 Abnormal posture: Secondary | ICD-10-CM | POA: Diagnosis not present

## 2019-11-01 DIAGNOSIS — R278 Other lack of coordination: Secondary | ICD-10-CM | POA: Diagnosis not present

## 2019-11-04 DIAGNOSIS — R2681 Unsteadiness on feet: Secondary | ICD-10-CM | POA: Diagnosis not present

## 2019-11-04 DIAGNOSIS — R296 Repeated falls: Secondary | ICD-10-CM | POA: Diagnosis not present

## 2019-11-04 DIAGNOSIS — R2689 Other abnormalities of gait and mobility: Secondary | ICD-10-CM | POA: Diagnosis not present

## 2019-11-05 DIAGNOSIS — R05 Cough: Secondary | ICD-10-CM | POA: Diagnosis not present

## 2019-11-06 DIAGNOSIS — R296 Repeated falls: Secondary | ICD-10-CM | POA: Diagnosis not present

## 2019-11-06 DIAGNOSIS — R2681 Unsteadiness on feet: Secondary | ICD-10-CM | POA: Diagnosis not present

## 2019-11-06 DIAGNOSIS — R2689 Other abnormalities of gait and mobility: Secondary | ICD-10-CM | POA: Diagnosis not present

## 2019-11-11 DIAGNOSIS — R296 Repeated falls: Secondary | ICD-10-CM | POA: Diagnosis not present

## 2019-11-11 DIAGNOSIS — R293 Abnormal posture: Secondary | ICD-10-CM | POA: Diagnosis not present

## 2019-11-11 DIAGNOSIS — R2689 Other abnormalities of gait and mobility: Secondary | ICD-10-CM | POA: Diagnosis not present

## 2019-11-11 DIAGNOSIS — R278 Other lack of coordination: Secondary | ICD-10-CM | POA: Diagnosis not present

## 2019-11-11 DIAGNOSIS — R2681 Unsteadiness on feet: Secondary | ICD-10-CM | POA: Diagnosis not present

## 2019-11-11 DIAGNOSIS — M6281 Muscle weakness (generalized): Secondary | ICD-10-CM | POA: Diagnosis not present

## 2019-11-13 DIAGNOSIS — M6281 Muscle weakness (generalized): Secondary | ICD-10-CM | POA: Diagnosis not present

## 2019-11-13 DIAGNOSIS — R296 Repeated falls: Secondary | ICD-10-CM | POA: Diagnosis not present

## 2019-11-13 DIAGNOSIS — R2681 Unsteadiness on feet: Secondary | ICD-10-CM | POA: Diagnosis not present

## 2019-11-13 DIAGNOSIS — R278 Other lack of coordination: Secondary | ICD-10-CM | POA: Diagnosis not present

## 2019-11-13 DIAGNOSIS — R293 Abnormal posture: Secondary | ICD-10-CM | POA: Diagnosis not present

## 2019-11-13 DIAGNOSIS — R2689 Other abnormalities of gait and mobility: Secondary | ICD-10-CM | POA: Diagnosis not present

## 2019-11-15 DIAGNOSIS — K219 Gastro-esophageal reflux disease without esophagitis: Secondary | ICD-10-CM | POA: Diagnosis not present

## 2019-11-15 DIAGNOSIS — K649 Unspecified hemorrhoids: Secondary | ICD-10-CM | POA: Diagnosis not present

## 2019-11-15 DIAGNOSIS — Z8711 Personal history of peptic ulcer disease: Secondary | ICD-10-CM | POA: Diagnosis not present

## 2019-11-15 DIAGNOSIS — S0011XA Contusion of right eyelid and periocular area, initial encounter: Secondary | ICD-10-CM | POA: Diagnosis not present

## 2019-11-15 DIAGNOSIS — I10 Essential (primary) hypertension: Secondary | ICD-10-CM | POA: Diagnosis not present

## 2019-11-15 DIAGNOSIS — M1990 Unspecified osteoarthritis, unspecified site: Secondary | ICD-10-CM | POA: Diagnosis not present

## 2019-11-15 DIAGNOSIS — R338 Other retention of urine: Secondary | ICD-10-CM | POA: Diagnosis not present

## 2019-11-15 DIAGNOSIS — H1089 Other conjunctivitis: Secondary | ICD-10-CM | POA: Diagnosis not present

## 2019-11-15 DIAGNOSIS — G309 Alzheimer's disease, unspecified: Secondary | ICD-10-CM | POA: Diagnosis not present

## 2019-11-15 DIAGNOSIS — Z9181 History of falling: Secondary | ICD-10-CM | POA: Diagnosis not present

## 2019-11-15 DIAGNOSIS — R1310 Dysphagia, unspecified: Secondary | ICD-10-CM | POA: Diagnosis not present

## 2019-11-15 DIAGNOSIS — E785 Hyperlipidemia, unspecified: Secondary | ICD-10-CM | POA: Diagnosis not present

## 2019-11-15 DIAGNOSIS — Z87891 Personal history of nicotine dependence: Secondary | ICD-10-CM | POA: Diagnosis not present

## 2019-11-18 DIAGNOSIS — R296 Repeated falls: Secondary | ICD-10-CM | POA: Diagnosis not present

## 2019-11-18 DIAGNOSIS — S0101XS Laceration without foreign body of scalp, sequela: Secondary | ICD-10-CM | POA: Diagnosis not present

## 2019-11-18 DIAGNOSIS — M6281 Muscle weakness (generalized): Secondary | ICD-10-CM | POA: Diagnosis not present

## 2019-11-18 DIAGNOSIS — G301 Alzheimer's disease with late onset: Secondary | ICD-10-CM | POA: Diagnosis not present

## 2019-11-22 DIAGNOSIS — H1089 Other conjunctivitis: Secondary | ICD-10-CM | POA: Diagnosis not present

## 2019-11-22 DIAGNOSIS — R338 Other retention of urine: Secondary | ICD-10-CM | POA: Diagnosis not present

## 2019-11-22 DIAGNOSIS — I10 Essential (primary) hypertension: Secondary | ICD-10-CM | POA: Diagnosis not present

## 2019-11-22 DIAGNOSIS — M1990 Unspecified osteoarthritis, unspecified site: Secondary | ICD-10-CM | POA: Diagnosis not present

## 2019-11-22 DIAGNOSIS — Z9181 History of falling: Secondary | ICD-10-CM | POA: Diagnosis not present

## 2019-11-22 DIAGNOSIS — Z8711 Personal history of peptic ulcer disease: Secondary | ICD-10-CM | POA: Diagnosis not present

## 2019-11-22 DIAGNOSIS — S0011XA Contusion of right eyelid and periocular area, initial encounter: Secondary | ICD-10-CM | POA: Diagnosis not present

## 2019-11-22 DIAGNOSIS — K219 Gastro-esophageal reflux disease without esophagitis: Secondary | ICD-10-CM | POA: Diagnosis not present

## 2019-11-22 DIAGNOSIS — E785 Hyperlipidemia, unspecified: Secondary | ICD-10-CM | POA: Diagnosis not present

## 2019-11-22 DIAGNOSIS — Z87891 Personal history of nicotine dependence: Secondary | ICD-10-CM | POA: Diagnosis not present

## 2019-11-22 DIAGNOSIS — R1310 Dysphagia, unspecified: Secondary | ICD-10-CM | POA: Diagnosis not present

## 2019-11-22 DIAGNOSIS — G309 Alzheimer's disease, unspecified: Secondary | ICD-10-CM | POA: Diagnosis not present

## 2019-11-22 DIAGNOSIS — K649 Unspecified hemorrhoids: Secondary | ICD-10-CM | POA: Diagnosis not present

## 2019-11-26 DIAGNOSIS — I10 Essential (primary) hypertension: Secondary | ICD-10-CM | POA: Diagnosis not present

## 2019-11-26 DIAGNOSIS — G309 Alzheimer's disease, unspecified: Secondary | ICD-10-CM | POA: Diagnosis not present

## 2019-11-26 DIAGNOSIS — Z9181 History of falling: Secondary | ICD-10-CM | POA: Diagnosis not present

## 2019-11-26 DIAGNOSIS — K649 Unspecified hemorrhoids: Secondary | ICD-10-CM | POA: Diagnosis not present

## 2019-11-26 DIAGNOSIS — R1310 Dysphagia, unspecified: Secondary | ICD-10-CM | POA: Diagnosis not present

## 2019-11-26 DIAGNOSIS — M1990 Unspecified osteoarthritis, unspecified site: Secondary | ICD-10-CM | POA: Diagnosis not present

## 2019-11-26 DIAGNOSIS — Z8711 Personal history of peptic ulcer disease: Secondary | ICD-10-CM | POA: Diagnosis not present

## 2019-11-26 DIAGNOSIS — R338 Other retention of urine: Secondary | ICD-10-CM | POA: Diagnosis not present

## 2019-11-26 DIAGNOSIS — E785 Hyperlipidemia, unspecified: Secondary | ICD-10-CM | POA: Diagnosis not present

## 2019-11-26 DIAGNOSIS — H1089 Other conjunctivitis: Secondary | ICD-10-CM | POA: Diagnosis not present

## 2019-11-26 DIAGNOSIS — S0011XA Contusion of right eyelid and periocular area, initial encounter: Secondary | ICD-10-CM | POA: Diagnosis not present

## 2019-11-26 DIAGNOSIS — Z87891 Personal history of nicotine dependence: Secondary | ICD-10-CM | POA: Diagnosis not present

## 2019-11-26 DIAGNOSIS — K219 Gastro-esophageal reflux disease without esophagitis: Secondary | ICD-10-CM | POA: Diagnosis not present

## 2019-11-27 DIAGNOSIS — H1089 Other conjunctivitis: Secondary | ICD-10-CM | POA: Diagnosis not present

## 2019-11-27 DIAGNOSIS — R1310 Dysphagia, unspecified: Secondary | ICD-10-CM | POA: Diagnosis not present

## 2019-11-27 DIAGNOSIS — Z9181 History of falling: Secondary | ICD-10-CM | POA: Diagnosis not present

## 2019-11-27 DIAGNOSIS — M1990 Unspecified osteoarthritis, unspecified site: Secondary | ICD-10-CM | POA: Diagnosis not present

## 2019-11-27 DIAGNOSIS — Z87891 Personal history of nicotine dependence: Secondary | ICD-10-CM | POA: Diagnosis not present

## 2019-11-27 DIAGNOSIS — K219 Gastro-esophageal reflux disease without esophagitis: Secondary | ICD-10-CM | POA: Diagnosis not present

## 2019-11-27 DIAGNOSIS — I10 Essential (primary) hypertension: Secondary | ICD-10-CM | POA: Diagnosis not present

## 2019-11-27 DIAGNOSIS — S0011XA Contusion of right eyelid and periocular area, initial encounter: Secondary | ICD-10-CM | POA: Diagnosis not present

## 2019-11-27 DIAGNOSIS — K649 Unspecified hemorrhoids: Secondary | ICD-10-CM | POA: Diagnosis not present

## 2019-11-27 DIAGNOSIS — E785 Hyperlipidemia, unspecified: Secondary | ICD-10-CM | POA: Diagnosis not present

## 2019-11-27 DIAGNOSIS — Z8711 Personal history of peptic ulcer disease: Secondary | ICD-10-CM | POA: Diagnosis not present

## 2019-11-27 DIAGNOSIS — G309 Alzheimer's disease, unspecified: Secondary | ICD-10-CM | POA: Diagnosis not present

## 2019-11-27 DIAGNOSIS — R338 Other retention of urine: Secondary | ICD-10-CM | POA: Diagnosis not present

## 2019-12-03 DIAGNOSIS — R338 Other retention of urine: Secondary | ICD-10-CM | POA: Diagnosis not present

## 2019-12-03 DIAGNOSIS — I10 Essential (primary) hypertension: Secondary | ICD-10-CM | POA: Diagnosis not present

## 2019-12-03 DIAGNOSIS — K219 Gastro-esophageal reflux disease without esophagitis: Secondary | ICD-10-CM | POA: Diagnosis not present

## 2019-12-03 DIAGNOSIS — Z8711 Personal history of peptic ulcer disease: Secondary | ICD-10-CM | POA: Diagnosis not present

## 2019-12-03 DIAGNOSIS — M1990 Unspecified osteoarthritis, unspecified site: Secondary | ICD-10-CM | POA: Diagnosis not present

## 2019-12-03 DIAGNOSIS — G309 Alzheimer's disease, unspecified: Secondary | ICD-10-CM | POA: Diagnosis not present

## 2019-12-03 DIAGNOSIS — K649 Unspecified hemorrhoids: Secondary | ICD-10-CM | POA: Diagnosis not present

## 2019-12-03 DIAGNOSIS — H1089 Other conjunctivitis: Secondary | ICD-10-CM | POA: Diagnosis not present

## 2019-12-03 DIAGNOSIS — E785 Hyperlipidemia, unspecified: Secondary | ICD-10-CM | POA: Diagnosis not present

## 2019-12-03 DIAGNOSIS — S0011XA Contusion of right eyelid and periocular area, initial encounter: Secondary | ICD-10-CM | POA: Diagnosis not present

## 2019-12-03 DIAGNOSIS — Z9181 History of falling: Secondary | ICD-10-CM | POA: Diagnosis not present

## 2019-12-03 DIAGNOSIS — R1310 Dysphagia, unspecified: Secondary | ICD-10-CM | POA: Diagnosis not present

## 2019-12-03 DIAGNOSIS — Z87891 Personal history of nicotine dependence: Secondary | ICD-10-CM | POA: Diagnosis not present

## 2019-12-05 DIAGNOSIS — Z87891 Personal history of nicotine dependence: Secondary | ICD-10-CM | POA: Diagnosis not present

## 2019-12-05 DIAGNOSIS — G309 Alzheimer's disease, unspecified: Secondary | ICD-10-CM | POA: Diagnosis not present

## 2019-12-05 DIAGNOSIS — M1990 Unspecified osteoarthritis, unspecified site: Secondary | ICD-10-CM | POA: Diagnosis not present

## 2019-12-05 DIAGNOSIS — R1310 Dysphagia, unspecified: Secondary | ICD-10-CM | POA: Diagnosis not present

## 2019-12-05 DIAGNOSIS — Z8711 Personal history of peptic ulcer disease: Secondary | ICD-10-CM | POA: Diagnosis not present

## 2019-12-05 DIAGNOSIS — E785 Hyperlipidemia, unspecified: Secondary | ICD-10-CM | POA: Diagnosis not present

## 2019-12-05 DIAGNOSIS — K219 Gastro-esophageal reflux disease without esophagitis: Secondary | ICD-10-CM | POA: Diagnosis not present

## 2019-12-05 DIAGNOSIS — K649 Unspecified hemorrhoids: Secondary | ICD-10-CM | POA: Diagnosis not present

## 2019-12-05 DIAGNOSIS — S0011XA Contusion of right eyelid and periocular area, initial encounter: Secondary | ICD-10-CM | POA: Diagnosis not present

## 2019-12-05 DIAGNOSIS — Z9181 History of falling: Secondary | ICD-10-CM | POA: Diagnosis not present

## 2019-12-05 DIAGNOSIS — I10 Essential (primary) hypertension: Secondary | ICD-10-CM | POA: Diagnosis not present

## 2019-12-05 DIAGNOSIS — H1089 Other conjunctivitis: Secondary | ICD-10-CM | POA: Diagnosis not present

## 2019-12-05 DIAGNOSIS — R338 Other retention of urine: Secondary | ICD-10-CM | POA: Diagnosis not present

## 2019-12-17 ENCOUNTER — Other Ambulatory Visit: Payer: Self-pay

## 2019-12-17 ENCOUNTER — Emergency Department (HOSPITAL_COMMUNITY): Payer: Medicare Other

## 2019-12-17 ENCOUNTER — Inpatient Hospital Stay (HOSPITAL_COMMUNITY)
Admission: EM | Admit: 2019-12-17 | Discharge: 2019-12-20 | DRG: 178 | Disposition: A | Discharge status: Nursing Home (Includes ICF) | Payer: Medicare Other | Source: Skilled Nursing Facility | Attending: Internal Medicine | Admitting: Internal Medicine

## 2019-12-17 ENCOUNTER — Encounter (HOSPITAL_COMMUNITY): Payer: Self-pay | Admitting: *Deleted

## 2019-12-17 DIAGNOSIS — E785 Hyperlipidemia, unspecified: Secondary | ICD-10-CM | POA: Diagnosis present

## 2019-12-17 DIAGNOSIS — H9190 Unspecified hearing loss, unspecified ear: Secondary | ICD-10-CM | POA: Diagnosis present

## 2019-12-17 DIAGNOSIS — Z833 Family history of diabetes mellitus: Secondary | ICD-10-CM

## 2019-12-17 DIAGNOSIS — Z23 Encounter for immunization: Secondary | ICD-10-CM

## 2019-12-17 DIAGNOSIS — M2550 Pain in unspecified joint: Secondary | ICD-10-CM | POA: Diagnosis not present

## 2019-12-17 DIAGNOSIS — F0390 Unspecified dementia without behavioral disturbance: Secondary | ICD-10-CM | POA: Diagnosis present

## 2019-12-17 DIAGNOSIS — I10 Essential (primary) hypertension: Secondary | ICD-10-CM | POA: Diagnosis present

## 2019-12-17 DIAGNOSIS — Z8546 Personal history of malignant neoplasm of prostate: Secondary | ICD-10-CM

## 2019-12-17 DIAGNOSIS — Z66 Do not resuscitate: Secondary | ICD-10-CM | POA: Diagnosis not present

## 2019-12-17 DIAGNOSIS — R410 Disorientation, unspecified: Secondary | ICD-10-CM | POA: Diagnosis not present

## 2019-12-17 DIAGNOSIS — Z87891 Personal history of nicotine dependence: Secondary | ICD-10-CM

## 2019-12-17 DIAGNOSIS — Z923 Personal history of irradiation: Secondary | ICD-10-CM | POA: Diagnosis not present

## 2019-12-17 DIAGNOSIS — G309 Alzheimer's disease, unspecified: Secondary | ICD-10-CM

## 2019-12-17 DIAGNOSIS — U071 COVID-19: Secondary | ICD-10-CM | POA: Diagnosis not present

## 2019-12-17 DIAGNOSIS — Z743 Need for continuous supervision: Secondary | ICD-10-CM | POA: Diagnosis not present

## 2019-12-17 DIAGNOSIS — J9811 Atelectasis: Secondary | ICD-10-CM | POA: Diagnosis not present

## 2019-12-17 DIAGNOSIS — N400 Enlarged prostate without lower urinary tract symptoms: Secondary | ICD-10-CM | POA: Diagnosis present

## 2019-12-17 DIAGNOSIS — Z79899 Other long term (current) drug therapy: Secondary | ICD-10-CM | POA: Diagnosis not present

## 2019-12-17 DIAGNOSIS — Z8711 Personal history of peptic ulcer disease: Secondary | ICD-10-CM | POA: Diagnosis not present

## 2019-12-17 DIAGNOSIS — G9340 Encephalopathy, unspecified: Secondary | ICD-10-CM

## 2019-12-17 DIAGNOSIS — F0280 Dementia in other diseases classified elsewhere without behavioral disturbance: Secondary | ICD-10-CM

## 2019-12-17 DIAGNOSIS — G9349 Other encephalopathy: Secondary | ICD-10-CM | POA: Diagnosis present

## 2019-12-17 DIAGNOSIS — R0902 Hypoxemia: Secondary | ICD-10-CM | POA: Diagnosis not present

## 2019-12-17 DIAGNOSIS — R404 Transient alteration of awareness: Secondary | ICD-10-CM | POA: Diagnosis not present

## 2019-12-17 DIAGNOSIS — I6782 Cerebral ischemia: Secondary | ICD-10-CM | POA: Diagnosis not present

## 2019-12-17 DIAGNOSIS — J3489 Other specified disorders of nose and nasal sinuses: Secondary | ICD-10-CM | POA: Diagnosis not present

## 2019-12-17 DIAGNOSIS — Z888 Allergy status to other drugs, medicaments and biological substances status: Secondary | ICD-10-CM

## 2019-12-17 DIAGNOSIS — G319 Degenerative disease of nervous system, unspecified: Secondary | ICD-10-CM | POA: Diagnosis not present

## 2019-12-17 DIAGNOSIS — Z7401 Bed confinement status: Secondary | ICD-10-CM | POA: Diagnosis not present

## 2019-12-17 LAB — CBG MONITORING, ED: Glucose-Capillary: 113 mg/dL — ABNORMAL HIGH (ref 70–99)

## 2019-12-17 LAB — CBC WITH DIFFERENTIAL/PLATELET
Abs Immature Granulocytes: 0.01 10*3/uL (ref 0.00–0.07)
Basophils Absolute: 0 10*3/uL (ref 0.0–0.1)
Basophils Relative: 0 %
Eosinophils Absolute: 0 10*3/uL (ref 0.0–0.5)
Eosinophils Relative: 0 %
HCT: 40.4 % (ref 39.0–52.0)
Hemoglobin: 13.2 g/dL (ref 13.0–17.0)
Immature Granulocytes: 0 %
Lymphocytes Relative: 23 %
Lymphs Abs: 1.2 10*3/uL (ref 0.7–4.0)
MCH: 29.7 pg (ref 26.0–34.0)
MCHC: 32.7 g/dL (ref 30.0–36.0)
MCV: 91 fL (ref 80.0–100.0)
Monocytes Absolute: 0.5 10*3/uL (ref 0.1–1.0)
Monocytes Relative: 9 %
Neutro Abs: 3.5 10*3/uL (ref 1.7–7.7)
Neutrophils Relative %: 68 %
Platelets: 116 10*3/uL — ABNORMAL LOW (ref 150–400)
RBC: 4.44 MIL/uL (ref 4.22–5.81)
RDW: 14.6 % (ref 11.5–15.5)
WBC: 5.2 10*3/uL (ref 4.0–10.5)
nRBC: 0 % (ref 0.0–0.2)

## 2019-12-17 LAB — COMPREHENSIVE METABOLIC PANEL
ALT: 43 U/L (ref 0–44)
AST: 119 U/L — ABNORMAL HIGH (ref 15–41)
Albumin: 3.4 g/dL — ABNORMAL LOW (ref 3.5–5.0)
Alkaline Phosphatase: 51 U/L (ref 38–126)
Anion gap: 10 (ref 5–15)
BUN: 8 mg/dL (ref 8–23)
CO2: 23 mmol/L (ref 22–32)
Calcium: 9.1 mg/dL (ref 8.9–10.3)
Chloride: 106 mmol/L (ref 98–111)
Creatinine, Ser: 1 mg/dL (ref 0.61–1.24)
GFR calc Af Amer: >60 mL/min (ref >60)
GFR calc non Af Amer: >60 mL/min (ref >60)
Glucose, Bld: 100 mg/dL — ABNORMAL HIGH (ref 70–99)
Potassium: 4.3 mmol/L (ref 3.5–5.1)
Sodium: 139 mmol/L (ref 135–145)
Total Bilirubin: 1.5 mg/dL — ABNORMAL HIGH (ref 0.3–1.2)
Total Protein: 6.7 g/dL (ref 6.5–8.1)

## 2019-12-17 LAB — RESPIRATORY PANEL BY RT PCR (FLU A&B, COVID)
Influenza A by PCR: NEGATIVE
Influenza B by PCR: NEGATIVE
SARS Coronavirus 2 by RT PCR: POSITIVE — AB

## 2019-12-17 LAB — URINALYSIS, ROUTINE W REFLEX MICROSCOPIC
Bilirubin Urine: NEGATIVE
Glucose, UA: NEGATIVE mg/dL
Hgb urine dipstick: NEGATIVE
Ketones, ur: NEGATIVE mg/dL
Leukocytes,Ua: NEGATIVE
Nitrite: NEGATIVE
Protein, ur: NEGATIVE mg/dL
Specific Gravity, Urine: 1.012 (ref 1.005–1.030)
pH: 5 (ref 5.0–8.0)

## 2019-12-17 LAB — LACTIC ACID, PLASMA: Lactic Acid, Venous: 1.7 mmol/L (ref 0.5–1.9)

## 2019-12-17 MED ORDER — ENOXAPARIN SODIUM 40 MG/0.4ML ~~LOC~~ SOLN
40.0000 mg | SUBCUTANEOUS | Status: DC
  Administered 2019-12-18 – 2019-12-20 (×3): 40 mg via SUBCUTANEOUS
  Filled 2019-12-17 (×2): qty 0.4

## 2019-12-17 MED ORDER — AMLODIPINE BESYLATE 10 MG PO TABS
10.0000 mg | ORAL_TABLET | Freq: Every day | ORAL | Status: DC
  Administered 2019-12-19 – 2019-12-20 (×2): 10 mg via ORAL
  Filled 2019-12-17 (×2): qty 1

## 2019-12-17 MED ORDER — ONDANSETRON HCL 4 MG/2ML IJ SOLN: 4.0000 mg | Freq: Four times a day (QID) | INTRAMUSCULAR | Status: DC | PRN

## 2019-12-17 MED ORDER — SERTRALINE HCL 25 MG PO TABS
25.0000 mg | ORAL_TABLET | Freq: Every day | ORAL | Status: DC
  Administered 2019-12-18 – 2019-12-19 (×2): 25 mg via ORAL
  Filled 2019-12-17 (×3): qty 1

## 2019-12-17 MED ORDER — HALOPERIDOL 1 MG PO TABS
2.0000 mg | ORAL_TABLET | Freq: Two times a day (BID) | ORAL | Status: DC | PRN
  Filled 2019-12-17: qty 2

## 2019-12-17 MED ORDER — MAGNESIUM HYDROXIDE 400 MG/5ML PO SUSP
30.0000 mL | Freq: Every evening | ORAL | Status: DC | PRN
  Filled 2019-12-17: qty 30

## 2019-12-17 MED ORDER — ACETAMINOPHEN 650 MG RE SUPP: 650.0000 mg | Freq: Four times a day (QID) | RECTAL | Status: DC | PRN

## 2019-12-17 MED ORDER — TAMSULOSIN HCL 0.4 MG PO CAPS
0.4000 mg | ORAL_CAPSULE | Freq: Every day | ORAL | Status: DC
  Administered 2019-12-19 – 2019-12-20 (×2): 0.4 mg via ORAL
  Filled 2019-12-17 (×2): qty 1

## 2019-12-17 MED ORDER — ACETAMINOPHEN 325 MG PO TABS: 650.0000 mg | ORAL_TABLET | Freq: Four times a day (QID) | ORAL | Status: DC | PRN

## 2019-12-17 MED ORDER — ONDANSETRON HCL 4 MG PO TABS: 4.0000 mg | ORAL_TABLET | Freq: Four times a day (QID) | ORAL | Status: DC | PRN

## 2019-12-17 NOTE — H&P (Signed)
History and Physical    Ernest Turner AYT:[id number] DOB: 15-Sep-1940 DOA: 12/17/2019  PCP: Orvis Brill, [facility]  Patient coming from: [place]  I have personally briefly reviewed patient's old medical records in [facility]  Chief Complaint: AMS  HPI: Ernest Turner is a 79 y.o. male with medical history significant of dementia, HTN.  Pt presents to ED from memory care after staff noted that he was less responsive than usual today.    Pt sent in to ED for AMS.  Patient unable to provide any additional history secondary to AMS.  ED Course: work up in ED is most notable for a positive COVID-19 test.  Pt has been vaccinated (looks like he got Alphonsa Overall in May).  No O2 requirement, no PNA on CXR.  Tm 99.7.   Review of Systems: Unable to perform due to AMS  Past Medical History:  Diagnosis Date  . Arthritis   . BPH (benign prostatic hyperplasia)   . Depression   . ED (erectile dysfunction)   . Full dentures   . Heart murmur   . Hemorrhoids   . History of peptic ulcer   . History of radiation therapy to prostate 7800 cGy 40 sessions 04-11-2012 to 06-11-2012   EXTERNAL BEAM PELVIC AREA FOR PROSTATE CANCER  . History of urinary retention   . HOH (hard of hearing)   . Hyperlipidemia   . Hypertension   . Hypogonadism male   . Lower urinary tract symptoms (LUTS)   . Memory difficulty   . Memory loss   . Poor historian   . Prostate cancer [facility]) dx 12/26/11  urologist-  dr [facility]  oncologist-  dr [facility]   GOLD SEED IMPLANT  03-07-2012--- Adenocarcinoma,gleason=3+3=6.,& 3+4=7,PSA=15.84--  s/p  external beam radiation 04-11-2012 to 06-11-2012  . Wears glasses     Past Surgical History:  Procedure Laterality Date  . CIRCUMCISION  09-21-2006  . COLONOSCOPY    . CYSTOSCOPY Right 10/16/2014   Procedure: CYSTO, RIGHT RETROGRADE COOK BALLOON DILATION RIGHT PROXIMAL URETHERAL STRICTURE;  Surgeon: Ernest Clines, MD;  Location: [place];  Service: Urology;  Laterality: Right;  . INGUINAL HERNIA REPAIR Left 04/29/2014   Procedure: LEFT INGUINAL HERNIA REPAIR;  Surgeon: Coralie Keens, MD;  Location: [facility];  Service: General;  Laterality: Left;  . INSERTION OF MESH Left 04/29/2014   Procedure: INSERTION OF MESH;  Surgeon: Coralie Keens, MD;  Location: [place];  Service: General;  Laterality: Left;  . MOUTH SURGERY     gum surgery  . MULTIPLE TOOTH EXTRACTIONS    . PROSTATE BIOPSY  12/26/11   PSA=15.84,gleason=3+3=6,& 3+4=7,Volume=27.98cc,Adenocarcinoma     reports that he quit smoking about 35 years ago. His smoking use included cigarettes. He has a 40.00 pack-year smoking history. He has never used smokeless tobacco. He reports that he does not drink alcohol and does not use drugs.  Allergies  Allergen Reactions  . Nutritional Supplements Other (See Comments)    Prostate supplement (Daughter is unable to confirm)  . Androgel [Testosterone] Other (See Comments)    (Daughter is unable to confirm)  . Viagra [Sildenafil Citrate] Other (See Comments)    (Daughter is unable to confirm)    Family History  Problem Relation Age of Onset  . Cancer Sister        liver to brain mets died late 53's  . Cancer Daughter        breast cancer died age 56  . Diabetes Mother  Prior to Admission medications   Medication Sig Start Date End Date Taking? Authorizing Provider  acetaminophen (TYLENOL) 500 MG tablet Take 500 mg by mouth every 6 (six) hours as needed for fever.    [provider]  alum & mag hydroxide-simeth (MAALOX/MYLANTA) 200-200-20 MG/5ML suspension Take 30 mLs by mouth every 6 (six) hours as needed for indigestion or heartburn.    [provider]  amLODipine (NORVASC) 10 MG tablet Take 1 tablet (10 mg total) by mouth daily. 10/24/19   Danford, Suann Larry, MD  guaifenesin (ROBITUSSIN) 100 MG/5ML syrup Take 200 mg by mouth every 6 (six) hours as needed for cough.    [provider]  haloperidol (HALDOL) 2 MG tablet Take 1 tablet (2 mg total) by mouth 2 (two) times daily. 10/23/19   Danford, Suann Larry, MD  loperamide (IMODIUM) 2 MG capsule Take 2 mg by mouth as needed for diarrhea or loose stools (not to exceed 8 doses).    [provider]  magnesium hydroxide (MILK OF MAGNESIA) 400 MG/5ML suspension Take 30 mLs by mouth at bedtime as needed for mild constipation.    [provider]  neomycin-bacitracin-polymyxin (NEOSPORIN) [phone] ointment Apply 1 application topically as needed (minor skin tears or abrasions).    [provider]  PRESCRIPTION MEDICATION Apply 1 application topically 3 (three) times daily as needed (agitation/anxiety  (apply to wrist)). Lorazepam gel    [provider]  sertraline (ZOLOFT) 25 MG tablet Take 25 mg by mouth at bedtime.    [provider]  tamsulosin (FLOMAX) 0.4 MG CAPS capsule Take 1 capsule (0.4 mg total) by mouth daily after breakfast. Take tamsulosin only if having difficulty voiding. 10/16/14   Ernest Clines, MD  vitamin B-12 1000 MCG tablet Take 1 tablet (1,000 mcg total) by mouth daily. 10/24/19   Ernest Dada, MD    Physical Exam: Vitals:   12/17/19 1728 12/17/19 1945  BP:  122/65  Pulse: 76 64  Resp: 12 14  Temp: 99.7 F (37.6 C)   TempSrc: Oral   SpO2: 96% 96%    Constitutional: NAD, calm, comfortable Eyes: PERRL, lids and conjunctivae normal ENMT: Mucous membranes are moist. Posterior pharynx clear of any exudate or lesions.Normal dentition.  Neck: normal, supple, no masses, no thyromegaly Respiratory: clear to auscultation bilaterally, no wheezing, no crackles. Normal respiratory effort. No accessory muscle use.  Cardiovascular: Regular rate and rhythm, no murmurs / rubs / gallops. No extremity edema. 2+ pedal pulses. No carotid bruits.  Abdomen: no tenderness, no masses palpated. No hepatosplenomegaly. Bowel sounds positive.  Musculoskeletal:  no clubbing / cyanosis. No joint deformity upper and lower extremities. Good ROM, no contractures. Normal muscle tone.  Skin: no rashes, lesions, ulcers. No induration Neurologic: Opens eyes to voice, mumbles incoherently, makes eye contact with me, doesn't follow commands.   Labs on Admission: I have personally reviewed following labs and imaging studies  CBC: Recent Labs  Lab 12/17/19 1948  WBC 5.2  NEUTROABS 3.5  HGB 13.2  HCT 40.4  MCV 91.0  PLT 093*   Basic Metabolic Panel: Recent Labs  Lab 12/17/19 1948  NA 139  K 4.3  CL 106  CO2 23  GLUCOSE 100*  BUN 8  CREATININE 1.00  CALCIUM 9.1   GFR: CrCl cannot be calculated (Unknown ideal weight.). Liver Function Tests: Recent Labs  Lab 12/17/19 1948  AST 119*  ALT 43  ALKPHOS 51  BILITOT 1.5*  PROT 6.7  ALBUMIN 3.4*  No results for input(s): LIPASE, AMYLASE in the last 168 hours. No results for input(s): AMMONIA in the last 168 hours. Coagulation Profile: No results for input(s): INR, PROTIME in the last 168 hours. Cardiac Enzymes: No results for input(s): CKTOTAL, CKMB, CKMBINDEX, TROPONINI in the last 168 hours. BNP (last 3 results) No results for input(s): PROBNP in the last 8760 hours. HbA1C: No results for input(s): HGBA1C in the last 72 hours. CBG: Recent Labs  Lab 12/17/19 1743  GLUCAP 113*   Lipid Profile: No results for input(s): CHOL, HDL, LDLCALC, TRIG, CHOLHDL, LDLDIRECT in the last 72 hours. Thyroid Function Tests: No results for input(s): TSH, T4TOTAL, FREET4, T3FREE, THYROIDAB in the last 72 hours. Anemia Panel: No results for input(s): VITAMINB12, FOLATE, FERRITIN, TIBC, IRON, RETICCTPCT in the last 72 hours. Urine analysis:    Component Value Date/Time   COLORURINE YELLOW 12/17/2019 1948   APPEARANCEUR CLEAR 12/17/2019 1948   LABSPEC 1.012 12/17/2019 1948   PHURINE 5.0 12/17/2019 1948   GLUCOSEU NEGATIVE 12/17/2019 1948   HGBUR NEGATIVE 12/17/2019 1948   BILIRUBINUR NEGATIVE  12/17/2019 1948   KETONESUR NEGATIVE 12/17/2019 1948   PROTEINUR NEGATIVE 12/17/2019 1948   UROBILINOGEN 0.2 08/08/2014 1357   NITRITE NEGATIVE 12/17/2019 1948   LEUKOCYTESUR NEGATIVE 12/17/2019 1948    Radiological Exams on Admission: CT Head Wo Contrast  Result Date: 12/17/2019 CLINICAL DATA:  Mental status change, unknown cause. Altered mental status. EXAM: CT HEAD WITHOUT CONTRAST TECHNIQUE: Contiguous axial images were obtained from the base of the skull through the vertex without intravenous contrast. COMPARISON:  Head CT 10/25/2019. FINDINGS: Brain: Mildly motion degraded examination. Stable moderate generalized cerebral atrophy. Stable, mild ill-defined hypoattenuation within the cerebral white matter which is nonspecific, but consistent with chronic small vessel ischemic disease. There is no acute intracranial hemorrhage. No demarcated cortical infarct. No extra-axial fluid collection. No evidence of intracranial mass. No midline shift. Vascular: No hyperdense vessel.  Atherosclerotic calcifications. Skull: Normal. Negative for fracture or focal lesion. Sinuses/Orbits: Redemonstrated chronic medially displaced fracture deformity of the right lamina papyracea. Visualized orbits show no acute finding. Mild ethmoid and maxillary sinus mucosal thickening. No significant mastoid effusion. IMPRESSION: Mildly motion degraded examination. No evidence of acute intracranial abnormality. Stable moderate generalized cerebral atrophy and mild chronic small vessel ischemic disease. Mild ethmoid and maxillary sinus mucosal thickening. Redemonstrated chronic medially displaced fracture deformity of the right lamina papyracea. Electronically Signed   By: Kellie Simmering DO   On: 12/17/2019 18:57   DG Chest Port 1 View  Result Date: 12/17/2019 CLINICAL DATA:  Altered mental status EXAM: PORTABLE CHEST 1 VIEW COMPARISON:  10/20/2019,, 07/23/2015, 09/06/2019, 05/28/2018 CT 10/21/2019 FINDINGS: Chronic elevation  left diaphragm with large left-sided hernia. Opacity at the left base may reflect chronic atelectasis. The right lung is grossly clear. The cardiomediastinal silhouette appears enlarged but is stable. IMPRESSION: Chronic elevation of left diaphragm with large left-sided hernia and probable chronic atelectasis at the left base. Electronically Signed   By: Donavan Foil M.D.   On: 12/17/2019 18:38    EKG: Independently reviewed.  Assessment/Plan Principal Problem:   Acute encephalopathy Active Problems:   Dementia ([place])   Essential hypertension   COVID-19 virus infection    1. Acute encephlopathy - 1. Delirium secondary to COVID-19 infection most likely I suspect 2. Doesn't sound like he is currently in SNF, just memory care / ALF 3. Tylenol PRN fever 4. Obs pt 5. If not improving rapidly, will need to start IVF in the AM to  prevent dehydration 6. PT/OT/SW consults 2. COVID-19 virus infection - 1. MAB infusion ordered in pt with COVID but no O2 requirement. 3. Dementia - 1. Holding haldol in case this is causing excessive lethargy 2. Not taking PO meds at the moment anyhow though 4. HTN - 1. Home BP meds ordered, but pt not taking POs at the moment 2. Monitor and add PRN IV if needed  DVT prophylaxis: Lovenox Code Status: DNR Family Communication: Spoke with daughter on phone Disposition Plan: TBD, suspect SNF vs improvement in mental status Consults called: None Admission status: Place in 31   Keagan Anthis, [facility]  How to contact the [facility] Attending or Consulting provider [place] or covering provider during after hours [place], for this patient?  1. Check the care team in [facility] and look for a) attending/consulting [facility] provider listed and b) the [facility] team listed 2. Log into [email or web address]  Amion Physician Scheduling and messaging for groups and whole hospitals  On call and physician scheduling software for group practices, residents, hospitalists and other  medical providers for call, clinic, rotation and shift schedules. On[employer] is a hospital-wide system for scheduling doctors and paging doctors on call. EasyPlot is for scientific plotting and data analysis.  [email or web address]  and use [facility]'s universal password to access. If you do not have the password, please contact the hospital operator.  3. Locate the [facility] provider you are looking for under [facility] and page to a number that you can be directly reached. 4. If you still have difficulty reaching the provider, please page the [facility] (Director on Call) for the Hospitalists listed on amion for assistance.  12/17/2019, 11:31 PM

## 2019-12-17 NOTE — ED Provider Notes (Signed)
[facility]ERGENCY DEPARTMENT Provider Note   CSN: [id number] Arrival date & time: 12/17/19  1722     History Chief Complaint  Patient presents with  . Altered Mental Status    Ernest Turner is a 79 y.o. male w PMHx HTN, dementia, prostate cancer, hypertension, brought into the ED by EMS from [place] for altered mental status that began today.  Per EMS report, patient is normally "walkie-talkie" however today became less responsive.  Per EMS, physician at the facility was concerned about a possible pneumonia, however there was no other elaboration on this matter.  Level 5 caveat secondary to altered mental status.  Patient is not answering any questions or following commands on evaluation.  The history is limited by the condition of the patient.       Past Medical History:  Diagnosis Date  . Arthritis   . BPH (benign prostatic hyperplasia)   . Depression   . ED (erectile dysfunction)   . Full dentures   . Heart murmur   . Hemorrhoids   . History of peptic ulcer   . History of radiation therapy to prostate 7800 cGy 40 sessions 04-11-2012 to 06-11-2012   EXTERNAL BEAM PELVIC AREA FOR PROSTATE CANCER  . History of urinary retention   . HOH (hard of hearing)   . Hyperlipidemia   . Hypertension   . Hypogonadism male   . Lower urinary tract symptoms (LUTS)   . Memory difficulty   . Memory loss   . Poor historian   . Prostate cancer [facility]) dx 12/26/11  urologist-  dr Gaynelle Arabian  oncologist-  dr Valere Dross   GOLD SEED IMPLANT  03-07-2012--- Adenocarcinoma,gleason=3+3=6.,& 3+4=7,PSA=15.84--  s/p  external beam radiation 04-11-2012 to 06-11-2012  . Wears glasses     Patient Active Problem List   Diagnosis Date Noted  . Acute encephalopathy 12/17/2019  . COVID-19 virus infection 12/17/2019  . Acute metabolic encephalopathy 28/36/6294  . Parapneumonic effusion 10/21/2019  . Pneumonia of left lower lobe due to infectious organism 10/21/2019  .  Lactic acidosis 10/21/2019  . Bacterial conjunctivitis of both eyes 10/21/2019  . Essential hypertension 10/21/2019  . Dementia ([place]) 08/12/2015  . Nausea & vomiting 04/04/2012  . BPH (benign prostatic hyperplasia)   . Hypogonadism male   . Allergy   . Arthritis   . Depression   . GERD (gastroesophageal reflux disease)   . Hypercholesterolemia   . Heart murmur   . Ulcer   . ED (erectile dysfunction)   . Prostate cancer ([place]) 12/26/2011    Past Surgical History:  Procedure Laterality Date  . CIRCUMCISION  09-21-2006  . COLONOSCOPY    . CYSTOSCOPY Right 10/16/2014   Procedure: CYSTO, RIGHT RETROGRADE COOK BALLOON DILATION RIGHT PROXIMAL URETHERAL STRICTURE;  Surgeon: Carolan Clines, MD;  Location: [facility];  Service: Urology;  Laterality: Right;  . INGUINAL HERNIA REPAIR Left 04/29/2014   Procedure: LEFT INGUINAL HERNIA REPAIR;  Surgeon: Coralie Keens, MD;  Location: [facility];  Service: General;  Laterality: Left;  . INSERTION OF MESH Left 04/29/2014   Procedure: INSERTION OF MESH;  Surgeon: Coralie Keens, MD;  Location: [facility];  Service: General;  Laterality: Left;  . MOUTH SURGERY     gum surgery  . MULTIPLE TOOTH EXTRACTIONS    . PROSTATE BIOPSY  12/26/11   PSA=15.84,gleason=3+3=6,& 3+4=7,Volume=27.98cc,Adenocarcinoma       Family History  Problem Relation Age of Onset  . Cancer Sister  liver to brain mets died late 31's  . Cancer Daughter        breast cancer died age 37  . Diabetes Mother     Social History   Tobacco Use  . Smoking status: Former Smoker    Packs/day: 2.00    Years: 20.00    Pack years: 40.00    Types: Cigarettes    Quit date: 01/21/1984    Years since quitting: 35.9  . Smokeless tobacco: Never Used  Substance Use Topics  . Alcohol use: No  . Drug use: No    Home Medications Prior to Admission medications   Medication Sig Start Date End Date Taking? Authorizing Provider  acetaminophen (TYLENOL) 500 MG tablet  Take 500 mg by mouth every 6 (six) hours as needed for fever.    [provider]  alum & mag hydroxide-simeth (MAALOX/MYLANTA) 200-200-20 MG/5ML suspension Take 30 mLs by mouth every 6 (six) hours as needed for indigestion or heartburn.    [provider]  amLODipine (NORVASC) 10 MG tablet Take 1 tablet (10 mg total) by mouth daily. 10/24/19   Danford, Suann Larry, MD  guaifenesin (ROBITUSSIN) 100 MG/5ML syrup Take 200 mg by mouth every 6 (six) hours as needed for cough.    [provider]  haloperidol (HALDOL) 2 MG tablet Take 1 tablet (2 mg total) by mouth 2 (two) times daily. 10/23/19   Danford, Suann Larry, MD  loperamide (IMODIUM) 2 MG capsule Take 2 mg by mouth as needed for diarrhea or loose stools (not to exceed 8 doses).    [provider]  magnesium hydroxide (MILK OF MAGNESIA) 400 MG/5ML suspension Take 30 mLs by mouth at bedtime as needed for mild constipation.    [provider]  neomycin-bacitracin-polymyxin (NEOSPORIN) [phone] ointment Apply 1 application topically as needed (minor skin tears or abrasions).    [provider]  PRESCRIPTION MEDICATION Apply 1 application topically 3 (three) times daily as needed (agitation/anxiety  (apply to wrist)). Lorazepam gel    [provider]  sertraline (ZOLOFT) 25 MG tablet Take 25 mg by mouth at bedtime.    [provider]  tamsulosin (FLOMAX) 0.4 MG CAPS capsule Take 1 capsule (0.4 mg total) by mouth daily after breakfast. Take tamsulosin only if having difficulty voiding. 10/16/14   Carolan Clines, MD  vitamin B-12 1000 MCG tablet Take 1 tablet (1,000 mcg total) by mouth daily. 10/24/19   Danford, Suann Larry, MD    Allergies    Nutritional supplements, Androgel [testosterone], and Viagra [sildenafil citrate]  Review of Systems   Review of Systems  Unable to perform ROS: Mental status change    Physical Exam Updated Vital Signs BP 122/65   Pulse 64    Temp 99.7 F (37.6 C) (Oral)   Resp 14   SpO2 96%   Physical Exam Vitals and nursing note reviewed.  Constitutional:      Appearance: He is well-developed.     Comments: Patient's eyes are closed though will open to verbal stimuli.  HENT:     Head: Normocephalic and atraumatic.  Eyes:     Conjunctiva/sclera: Conjunctivae normal.  Cardiovascular:     Rate and Rhythm: Normal rate and regular rhythm.  Pulmonary:     Effort: Pulmonary effort is normal. No respiratory distress.     Comments: Diffuse wheezes and rales bilaterally. Abdominal:     General: Bowel sounds are normal.     Palpations: Abdomen is soft.  Skin:    General:  Skin is warm.  Neurological:     Mental Status: He is alert.     Comments: Patient is not following any commands.  No obvious facial droop.  Patient is holding his arms in a flexed position and is tense.  No obvious weakness to bilateral upper extremities, patient resisting any movement of his arms.  Psychiatric:        Behavior: Behavior normal.     ED Results / Procedures / Treatments   Labs (all labs ordered are listed, but only abnormal results are displayed) Labs Reviewed  RESPIRATORY PANEL BY RT PCR (FLU A&B, COVID) - Abnormal; Notable for the following components:      Result Value   SARS Coronavirus 2 by RT PCR POSITIVE (*)    All other components within normal limits  COMPREHENSIVE METABOLIC PANEL - Abnormal; Notable for the following components:   Glucose, Bld 100 (*)    Albumin 3.4 (*)    AST 119 (*)    Total Bilirubin 1.5 (*)    All other components within normal limits  CBC WITH DIFFERENTIAL/PLATELET - Abnormal; Notable for the following components:   Platelets 116 (*)    All other components within normal limits  CBG MONITORING, ED - Abnormal; Notable for the following components:   Glucose-Capillary 113 (*)    All other components within normal limits  CULTURE, BLOOD (ROUTINE X 2)  CULTURE, BLOOD (ROUTINE X 2)  URINE CULTURE    LACTIC ACID, PLASMA  URINALYSIS, ROUTINE W REFLEX MICROSCOPIC  LACTIC ACID, PLASMA    EKG None  Radiology CT Head Wo Contrast  Result Date: 12/17/2019 CLINICAL DATA:  Mental status change, unknown cause. Altered mental status. EXAM: CT HEAD WITHOUT CONTRAST TECHNIQUE: Contiguous axial images were obtained from the base of the skull through the vertex without intravenous contrast. COMPARISON:  Head CT 10/25/2019. FINDINGS: Brain: Mildly motion degraded examination. Stable moderate generalized cerebral atrophy. Stable, mild ill-defined hypoattenuation within the cerebral white matter which is nonspecific, but consistent with chronic small vessel ischemic disease. There is no acute intracranial hemorrhage. No demarcated cortical infarct. No extra-axial fluid collection. No evidence of intracranial mass. No midline shift. Vascular: No hyperdense vessel.  Atherosclerotic calcifications. Skull: Normal. Negative for fracture or focal lesion. Sinuses/Orbits: Redemonstrated chronic medially displaced fracture deformity of the right lamina papyracea. Visualized orbits show no acute finding. Mild ethmoid and maxillary sinus mucosal thickening. No significant mastoid effusion. IMPRESSION: Mildly motion degraded examination. No evidence of acute intracranial abnormality. Stable moderate generalized cerebral atrophy and mild chronic small vessel ischemic disease. Mild ethmoid and maxillary sinus mucosal thickening. Redemonstrated chronic medially displaced fracture deformity of the right lamina papyracea. Electronically Signed   By: Kellie Simmering DO   On: 12/17/2019 18:57   DG Chest Port 1 View  Result Date: 12/17/2019 CLINICAL DATA:  Altered mental status EXAM: PORTABLE CHEST 1 VIEW COMPARISON:  10/20/2019,, 07/23/2015, 09/06/2019, 05/28/2018 CT 10/21/2019 FINDINGS: Chronic elevation left diaphragm with large left-sided hernia. Opacity at the left base may reflect chronic atelectasis. The right lung is grossly  clear. The cardiomediastinal silhouette appears enlarged but is stable. IMPRESSION: Chronic elevation of left diaphragm with large left-sided hernia and probable chronic atelectasis at the left base. Electronically Signed   By: Donavan Foil M.D.   On: 12/17/2019 18:38    Procedures Procedures (including critical care time)  Medications Ordered in ED Medications  ondansetron (ZOFRAN) tablet 4 mg (has no administration in time range)    Or  ondansetron (ZOFRAN) injection  4 mg (has no administration in time range)  enoxaparin (LOVENOX) injection 40 mg (40 mg Subcutaneous Not Given 12/18/19 0017)  acetaminophen (TYLENOL) tablet 650 mg (has no administration in time range)    Or  acetaminophen (TYLENOL) suppository 650 mg (has no administration in time range)  tamsulosin (FLOMAX) capsule 0.4 mg (has no administration in time range)  sertraline (ZOLOFT) tablet 25 mg (25 mg Oral Not Given 12/18/19 0007)  magnesium hydroxide (MILK OF MAGNESIA) suspension 30 mL (has no administration in time range)  amLODipine (NORVASC) tablet 10 mg (has no administration in time range)  haloperidol (HALDOL) tablet 2 mg (has no administration in time range)    ED Course  I have reviewed the triage vital signs and the nursing notes.  Pertinent labs & imaging results that were available during my care of the patient were reviewed by me and considered in my medical decision making (see chart for details).  Clinical Course as of Dec 17 21  Tue Dec 17, 2019  1730 Patient evaluated upon arrival to room.    [JR]    Clinical Course User Index [JR] Nikolis Berent, [place] N, PA-C   MDM Rules/Calculators/A&P                          Patient brought in from [facility] for altered mental status.  Initially EMS reports they were concerned for pneumonia and altered mental status, however after discussing with patient's daughter, it appears patient was found down on the ground though was unwitnessed if he fell.  On  evaluation, he does not provide much response with verbal stimuli, will sometimes open his eyes.  Otherwise, he does not have any apparent neglect. He is afebrile, with stable VS.  EKG sinus rhythm.  CT head is negative for bleed or fracture.  Chest x-ray is negative for pneumonia.  Laboratory work-up is overall quite unremarkable with negative urine, no leukocytosis, no acute electrolyte derangements.  Lactic acid is within normal limits.  Consulted for admission for altered mental status.  Pending admission, Covid test resulted as positive.  Suspect encephalopathy secondary to COVID-19.  Hospitalist to admit for further management.  Final Clinical Impression(s) / ED Diagnoses Final diagnoses:  Acute encephalopathy  COVID-19    Rx / DC Orders ED Discharge Orders    None       Cuca Benassi, [place] N, PA-C 12/18/19 0023    Carmin Muskrat, MD 12/18/19 2336

## 2019-12-17 NOTE — ED Triage Notes (Signed)
Pt from [facility] is here for AMS.  He had PNA recently and was supposed to be seen by MD today.  Pt has been altered since today according to staff.  EMS found him generally weak and slumped to left side on ems arrival, they did a stroke screen which was negative.  Per SNF staff pt is usually walkie talkie.

## 2019-12-18 DIAGNOSIS — I10 Essential (primary) hypertension: Secondary | ICD-10-CM | POA: Diagnosis present

## 2019-12-18 DIAGNOSIS — Z87891 Personal history of nicotine dependence: Secondary | ICD-10-CM | POA: Diagnosis not present

## 2019-12-18 DIAGNOSIS — M2550 Pain in unspecified joint: Secondary | ICD-10-CM | POA: Diagnosis not present

## 2019-12-18 DIAGNOSIS — G9349 Other encephalopathy: Secondary | ICD-10-CM | POA: Diagnosis present

## 2019-12-18 DIAGNOSIS — F0390 Unspecified dementia without behavioral disturbance: Secondary | ICD-10-CM | POA: Diagnosis present

## 2019-12-18 DIAGNOSIS — Z743 Need for continuous supervision: Secondary | ICD-10-CM | POA: Diagnosis not present

## 2019-12-18 DIAGNOSIS — U071 COVID-19: Secondary | ICD-10-CM | POA: Diagnosis present

## 2019-12-18 DIAGNOSIS — G9340 Encephalopathy, unspecified: Secondary | ICD-10-CM | POA: Diagnosis present

## 2019-12-18 DIAGNOSIS — Z833 Family history of diabetes mellitus: Secondary | ICD-10-CM | POA: Diagnosis not present

## 2019-12-18 DIAGNOSIS — E785 Hyperlipidemia, unspecified: Secondary | ICD-10-CM | POA: Diagnosis present

## 2019-12-18 DIAGNOSIS — Z8711 Personal history of peptic ulcer disease: Secondary | ICD-10-CM | POA: Diagnosis not present

## 2019-12-18 DIAGNOSIS — H9190 Unspecified hearing loss, unspecified ear: Secondary | ICD-10-CM | POA: Diagnosis present

## 2019-12-18 DIAGNOSIS — N400 Enlarged prostate without lower urinary tract symptoms: Secondary | ICD-10-CM | POA: Diagnosis present

## 2019-12-18 DIAGNOSIS — Z888 Allergy status to other drugs, medicaments and biological substances status: Secondary | ICD-10-CM | POA: Diagnosis not present

## 2019-12-18 DIAGNOSIS — Z23 Encounter for immunization: Secondary | ICD-10-CM | POA: Diagnosis not present

## 2019-12-18 DIAGNOSIS — Z7401 Bed confinement status: Secondary | ICD-10-CM | POA: Diagnosis not present

## 2019-12-18 DIAGNOSIS — Z8546 Personal history of malignant neoplasm of prostate: Secondary | ICD-10-CM | POA: Diagnosis not present

## 2019-12-18 DIAGNOSIS — Z79899 Other long term (current) drug therapy: Secondary | ICD-10-CM | POA: Diagnosis not present

## 2019-12-18 DIAGNOSIS — Z66 Do not resuscitate: Secondary | ICD-10-CM | POA: Diagnosis present

## 2019-12-18 DIAGNOSIS — Z923 Personal history of irradiation: Secondary | ICD-10-CM | POA: Diagnosis not present

## 2019-12-18 LAB — CBC
HCT: 41.5 % (ref 39.0–52.0)
Hemoglobin: 13.4 g/dL (ref 13.0–17.0)
MCH: 29.4 pg (ref 26.0–34.0)
MCHC: 32.3 g/dL (ref 30.0–36.0)
MCV: 91 fL (ref 80.0–100.0)
Platelets: 111 10*3/uL — ABNORMAL LOW (ref 150–400)
RBC: 4.56 MIL/uL (ref 4.22–5.81)
RDW: 14.6 % (ref 11.5–15.5)
WBC: 4.8 10*3/uL (ref 4.0–10.5)
nRBC: 0 % (ref 0.0–0.2)

## 2019-12-18 LAB — BRAIN NATRIURETIC PEPTIDE: B Natriuretic Peptide: 85.4 pg/mL (ref 0.0–100.0)

## 2019-12-18 LAB — COMPREHENSIVE METABOLIC PANEL
ALT: 47 U/L — ABNORMAL HIGH (ref 0–44)
AST: 120 U/L — ABNORMAL HIGH (ref 15–41)
Albumin: 3.3 g/dL — ABNORMAL LOW (ref 3.5–5.0)
Alkaline Phosphatase: 51 U/L (ref 38–126)
Anion gap: 12 (ref 5–15)
BUN: 5 mg/dL — ABNORMAL LOW (ref 8–23)
CO2: 24 mmol/L (ref 22–32)
Calcium: 9.1 mg/dL (ref 8.9–10.3)
Chloride: 103 mmol/L (ref 98–111)
Creatinine, Ser: 1.02 mg/dL (ref 0.61–1.24)
GFR calc Af Amer: >60 mL/min (ref >60)
GFR calc non Af Amer: >60 mL/min (ref >60)
Glucose, Bld: 87 mg/dL (ref 70–99)
Potassium: 3.4 mmol/L — ABNORMAL LOW (ref 3.5–5.1)
Sodium: 139 mmol/L (ref 135–145)
Total Bilirubin: 1.6 mg/dL — ABNORMAL HIGH (ref 0.3–1.2)
Total Protein: 6.8 g/dL (ref 6.5–8.1)

## 2019-12-18 LAB — C-REACTIVE PROTEIN: CRP: 0.7 mg/dL (ref <1.0)

## 2019-12-18 LAB — URINE CULTURE: Culture: <10000 — AB

## 2019-12-18 LAB — D-DIMER, QUANTITATIVE: D-Dimer, Quant: 1.46 ug/mL-FEU — ABNORMAL HIGH (ref 0.00–0.50)

## 2019-12-18 LAB — MAGNESIUM: Magnesium: 1.8 mg/dL (ref 1.7–2.4)

## 2019-12-18 MED ORDER — FAMOTIDINE IN NACL 20-0.9 MG/50ML-% IV SOLN
20.0000 mg | Freq: Once | INTRAVENOUS | Status: DC | PRN
  Filled 2019-12-18: qty 50

## 2019-12-18 MED ORDER — SODIUM CHLORIDE 0.9 % IV SOLN: 1200.0000 mg | Freq: Once | INTRAVENOUS | Status: DC

## 2019-12-18 MED ORDER — DIPHENHYDRAMINE HCL 50 MG/ML IJ SOLN: 50.0000 mg | Freq: Once | INTRAMUSCULAR | Status: DC | PRN

## 2019-12-18 MED ORDER — ALBUTEROL SULFATE HFA 108 (90 BASE) MCG/ACT IN AERS
2.0000 | INHALATION_SPRAY | Freq: Once | RESPIRATORY_TRACT | Status: DC | PRN
  Filled 2019-12-18: qty 6.7

## 2019-12-18 MED ORDER — SODIUM CHLORIDE 0.9 % IV SOLN: INTRAVENOUS | Status: DC | PRN

## 2019-12-18 MED ORDER — METHYLPREDNISOLONE SODIUM SUCC 125 MG IJ SOLR: 125.0000 mg | Freq: Once | INTRAMUSCULAR | Status: DC | PRN

## 2019-12-18 MED ORDER — EPINEPHRINE 0.3 MG/0.3ML IJ SOAJ
0.3000 mg | Freq: Once | INTRAMUSCULAR | Status: DC | PRN
  Filled 2019-12-18: qty 0.6

## 2019-12-18 MED ORDER — SODIUM CHLORIDE 0.9 % IV SOLN
Freq: Once | INTRAVENOUS | Status: AC
  Filled 2019-12-18: qty 20

## 2019-12-18 NOTE — Plan of Care (Signed)
  Problem: Education: Goal: Knowledge of General Education information will improve Description: Including pain rating scale, medication(s)/side effects and non-pharmacologic comfort measures Outcome: Adequate for Discharge   

## 2019-12-18 NOTE — Progress Notes (Signed)
Patient is hard to redirect and does not follow command wells. Patient is also holding saliva in his mouth. Will continue to monitor.

## 2019-12-18 NOTE — Progress Notes (Signed)
Patient pulled out IV. Per Dr. Tonie Griffith ok to leave IV out. Patient has no IV medications or fluids ordered at this time. Will continue to monitor.

## 2019-12-18 NOTE — Progress Notes (Signed)
PROGRESS NOTE                                                                                                                                                                                                             Patient Demographics:    Ernest Turner, is a 79 y.o. male, DOB - 1940/07/08, SVX:[id number]  Outpatient Primary MD for the patient is [facility]    LOS - 0  Admit date - 12/17/2019    Chief Complaint  Patient presents with  . Altered Mental Status       Brief Narrative  -  Ernest Turner is a 79 y.o. male with medical history significant of dementia, HTN.  Pt presents to ED from memory care after staff noted that he was less responsive than usual today.   Patient was unable to provide any additional history secondary to AMS.   Subjective:    Janie Morning today has, No headache, No chest pain, No abdominal pain - No Nausea, No new weakness tingling or numbness, no  SOB.    Assessment  & Plan :     1. Incidental breakthrough COVID-19 infection with possible mild Covid encephalopathy-patient is vaccinated with J&J vaccine, no shortness of breath or infiltrates on chest x-ray, currently on room air and stable, has received monoclonal antibody, will monitor closely.  Encouraged the patient to sit up in chair in the daytime use I-S and flutter valve for pulmonary toiletry and then prone in bed when at night.  Will advance activity and titrate down oxygen as possible.     SpO2: 98 %  Recent Labs  Lab 12/17/19 1948 12/17/19 1950 12/17/19 2021  WBC 5.2  --   --   PLT 116*  --   --   AST 119*  --   --   ALT 43  --   --   ALKPHOS 51  --   --   BILITOT 1.5*  --   --   ALBUMIN 3.4*  --   --   LATICACIDVEN  --  1.7  --   SARSCOV2NAA  --   --  POSITIVE*     2.  Dementia with mild acute encephalopathy due to #1 above.  CT head unremarkable, no focal deficits, monitor with  supportive care.  Minimize narcotics and benzodiazepine use, remains  at risk for delirium.  3.  Underlying dementia.  Stable continue home dose sertraline.  4.  Essential hypertension.  Continue Norvasc.  5.  BPH.  On Flomax continue.    Condition - Guarded  Family Communication  :  Daughter [name] [phone] on 12/18/19  Code Status :  DNR  Consults  :  None  Procedures  :  CT Head - Non acute  PUD Prophylaxis : None  Disposition Plan  :    Status is: Inpt  Dispo: The patient is from: SNF              Anticipated d/c is to: SNF              Anticipated d/c date is: 3 days              Patient currently is not medically stable to d/c.   DVT Prophylaxis  :  Lovenox    Lab Results  Component Value Date   PLT 116 (L) 12/17/2019    Diet :  Diet Order            Diet regular Room service appropriate? Yes; Fluid consistency: Thin  Diet effective now                  Inpatient Medications  Scheduled Meds: . amLODipine  10 mg Oral Daily  . enoxaparin (LOVENOX) injection  40 mg Subcutaneous Q24H  . sertraline  25 mg Oral QHS  . tamsulosin  0.4 mg Oral QPC breakfast   Continuous Infusions: . sodium chloride    . famotidine (PEPCID) IV     PRN Meds:.sodium chloride, acetaminophen **OR** acetaminophen, albuterol, diphenhydrAMINE, EPINEPHrine, famotidine (PEPCID) IV, haloperidol, magnesium hydroxide, methylPREDNISolone (SOLU-MEDROL) injection, ondansetron **OR** ondansetron (ZOFRAN) IV  Antibiotics  :    Anti-infectives (From admission, onward)   None       Time Spent in minutes  30   Lala Lund M.D on 12/18/2019 at 1:15 PM  To page go to [email or web address] - password [facility]  [facility] -  Office  [phone]     See all Orders from today for further details    Objective:   Vitals:   12/18/19 0430 12/18/19 0445 12/18/19 1036 12/18/19 1108  BP: 131/70 (!) 114/53 121/60   Pulse: 74 61 65   Resp: 18 15 16 20   Temp:   98.9 F (37.2 C)  98.8 F (37.1 C)  TempSrc:   Oral Oral  SpO2: 98% 94% 95% 98%    Wt Readings from Last 3 Encounters:  08/12/15 81.8 kg  10/16/14 80.5 kg  04/29/14 87.5 kg    No intake or output data in the 24 hours ending 12/18/19 1315   Physical Exam  Awake remains confused, No new F.N deficits, Normal affect [place].AT,PERRAL Supple Neck,No JVD, No cervical lymphadenopathy appriciated.  Symmetrical Chest wall movement, Good air movement bilaterally, CTAB RRR,No Gallops,Rubs or new Murmurs, No Parasternal Heave +ve B.Sounds, Abd Soft, No tenderness, No organomegaly appriciated, No rebound - guarding or rigidity. No Cyanosis, Clubbing or edema, No new Rash or bruise      Data Review:    CBC Recent Labs  Lab 12/17/19 1948  WBC 5.2  HGB 13.2  HCT 40.4  PLT 116*  MCV 91.0  MCH 29.7  MCHC 32.7  RDW 14.6  LYMPHSABS 1.2  MONOABS 0.5  EOSABS 0.0  BASOSABS 0.0    Recent Labs  Lab 12/17/19 1948 12/17/19 1950  NA 139  --  K 4.3  --   CL 106  --   CO2 23  --   GLUCOSE 100*  --   BUN 8  --   CREATININE 1.00  --   CALCIUM 9.1  --   AST 119*  --   ALT 43  --   ALKPHOS 51  --   BILITOT 1.5*  --   ALBUMIN 3.4*  --   LATICACIDVEN  --  1.7    ------------------------------------------------------------------------------------------------------------------ No results for input(s): CHOL, HDL, LDLCALC, TRIG, CHOLHDL, LDLDIRECT in the last 72 hours.  No results found for: HGBA1C ------------------------------------------------------------------------------------------------------------------ No results for input(s): TSH, T4TOTAL, T3FREE, THYROIDAB in the last 72 hours.  Invalid input(s): FREET3  Cardiac Enzymes No results for input(s): CKMB, TROPONINI, MYOGLOBIN in the last 168 hours.  Invalid input(s): CK ------------------------------------------------------------------------------------------------------------------ No results found for: BNP  Micro Results Recent Results  (from the past 240 hour(s))  Culture, blood (routine x 2)     Status: None (Preliminary result)   Collection Time: 12/17/19  7:48 PM   Specimen: BLOOD  Result Value Ref Range Status   Specimen Description BLOOD SITE NOT SPECIFIED  Final   Special Requests   Final    BOTTLES DRAWN AEROBIC AND ANAEROBIC Blood Culture results may not be optimal due to an inadequate volume of blood received in culture bottles   Culture   Final    NO GROWTH < 12 HOURS Performed at [facility] [address] [place], [place] [address]    Report Status PENDING  Incomplete  Culture, blood (routine x 2)     Status: None (Preliminary result)   Collection Time: 12/17/19  8:21 PM   Specimen: BLOOD  Result Value Ref Range Status   Specimen Description BLOOD SITE NOT SPECIFIED  Final   Special Requests   Final    BOTTLES DRAWN AEROBIC AND ANAEROBIC Blood Culture results may not be optimal due to an inadequate volume of blood received in culture bottles   Culture   Final    NO GROWTH < 12 HOURS Performed at [facility], [place] [address], [place], [place] [address]    Report Status PENDING  Incomplete  Respiratory Panel by RT PCR (Flu A&B, Covid) - Nasopharyngeal Swab     Status: Abnormal   Collection Time: 12/17/19  8:21 PM   Specimen: Nasopharyngeal Swab  Result Value Ref Range Status   SARS Coronavirus 2 by RT PCR POSITIVE (A) NEGATIVE Final    Comment: RESULT CALLED TO, READ BACK BY AND VERIFIED WITH: M SCRUGGS RN 12/17/19 AT 2240 SK (NOTE) SARS-CoV-2 target nucleic acids are DETECTED.  SARS-CoV-2 RNA is generally detectable in upper respiratory specimens  during the acute phase of infection. Positive results are indicative of the presence of the identified virus, but do not rule out bacterial infection or co-infection with other pathogens not detected by the test. Clinical correlation with patient history and other diagnostic information is necessary to determine patient infection status.  The expected result is Negative.  Fact Sheet for Patients:  PinkCheek.be  Fact Sheet for Healthcare Providers: GravelBags.it  This test is not yet approved or cleared by the [place] FDA and  has been authorized for detection and/or diagnosis of SARS-CoV-2 by FDA under an Emergency Use Authorization (EUA).  This EUA will remain in effect (meaning this test can be [place] ed) for the duration of  the COVID-19 declaration under Section 564(b)(1) of the Act, 21 U.S.C. section 360bbb-3(b)(1), unless the  authorization is terminated or revoked sooner.      Influenza A by PCR NEGATIVE NEGATIVE Final   Influenza B by PCR NEGATIVE NEGATIVE Final    Comment: (NOTE) The Xpert Xpress SARS-CoV-2/FLU/RSV assay is intended as an aid in  the diagnosis of influenza from Nasopharyngeal swab specimens and  should not be used as a sole basis for treatment. Nasal washings and  aspirates are unacceptable for Xpert Xpress SARS-CoV-2/FLU/RSV  testing.  Fact Sheet for Patients: PinkCheek.be  Fact Sheet for Healthcare Providers: GravelBags.it  This test is not yet approved or cleared by the [place] FDA and  has been authorized for detection and/or diagnosis of SARS-CoV-2 by  FDA under an Emergency Use Authorization (EUA). This EUA will remain  in effect (meaning this test can be used) for the duration of the  Covid-19 declaration under Section 564(b)(1) of the Act, 21  U.S.C. section 360bbb-3(b)(1), unless the authorization is  terminated or revoked. Performed at [facility], [place] [address], [place], [place] [address]     Radiology Reports CT Head Wo Contrast  Result Date: 12/17/2019 CLINICAL DATA:  Mental status change, unknown cause. Altered mental status. EXAM: CT HEAD WITHOUT CONTRAST TECHNIQUE: Contiguous axial images were obtained from the base of the skull  through the vertex without intravenous contrast. COMPARISON:  Head CT 10/25/2019. FINDINGS: Brain: Mildly motion degraded examination. Stable moderate generalized cerebral atrophy. Stable, mild ill-defined hypoattenuation within the cerebral white matter which is nonspecific, but consistent with chronic small vessel ischemic disease. There is no acute intracranial hemorrhage. No demarcated cortical infarct. No extra-axial fluid collection. No evidence of intracranial mass. No midline shift. Vascular: No hyperdense vessel.  Atherosclerotic calcifications. Skull: Normal. Negative for fracture or focal lesion. Sinuses/Orbits: Redemonstrated chronic medially displaced fracture deformity of the right lamina papyracea. Visualized orbits show no acute finding. Mild ethmoid and maxillary sinus mucosal thickening. No significant mastoid effusion. IMPRESSION: Mildly motion degraded examination. No evidence of acute intracranial abnormality. Stable moderate generalized cerebral atrophy and mild chronic small vessel ischemic disease. Mild ethmoid and maxillary sinus mucosal thickening. Redemonstrated chronic medially displaced fracture deformity of the right lamina papyracea. Electronically Signed   By: Kellie Simmering DO   On: 12/17/2019 18:57   DG Chest Port 1 View  Result Date: 12/17/2019 CLINICAL DATA:  Altered mental status EXAM: PORTABLE CHEST 1 VIEW COMPARISON:  10/20/2019,, 07/23/2015, 09/06/2019, 05/28/2018 CT 10/21/2019 FINDINGS: Chronic elevation left diaphragm with large left-sided hernia. Opacity at the left base may reflect chronic atelectasis. The right lung is grossly clear. The cardiomediastinal silhouette appears enlarged but is stable. IMPRESSION: Chronic elevation of left diaphragm with large left-sided hernia and probable chronic atelectasis at the left base. Electronically Signed   By: Donavan Foil M.D.   On: 12/17/2019 18:38

## 2019-12-18 NOTE — Evaluation (Signed)
Physical Therapy Evaluation Patient Details Name: Ernest Turner MRN: [id number] DOB: 02-09-1941 Today's Date: 12/18/2019   History of Present Illness  79yo male brought to the ED due to decreased responsiveness and AMS. Found to be Covid +. PMH dementia, HOH, HLD, HTN, prostate CA  Clinical Impression   Patient received in bed, very pleasantly confused but oriented to self only. Extremely talkative and tangential today, does not follow simple cues or commands. Somewhat physically resistive to mobility and needed totalA to get to EOB, from there physically resisted attempts at standing. Feel that he is much more physically mobile and capable of ambulation/transfers than he demonstrated today- session definitely limited by confusion/cognitive status. Left in bed with all needs met, bed alarm active. OK to return to memory care unit if they can provide appropriate levels of physical assist, if not he will need SNF.     Follow Up Recommendations Other (comment) (return to memory care unit as long as they can provide appropriate levels of physical assist- if not, may need SNF)    Equipment Recommendations  Other (comment) (TBD)    Recommendations for Other Services       Precautions / Restrictions Precautions Precautions: Fall;Other (comment) Precaution Comments: COVID + Restrictions Weight Bearing Restrictions: No      Mobility  Bed Mobility Overal bed mobility: Needs Assistance Bed Mobility: Supine to Sit;Sit to Supine     Supine to sit: Total assist Sit to supine: Total assist   General bed mobility comments: totalA only due to confusion and lack of ability to understand what I was asking him to do; also moderately physically resistive. Feel he will do better when he self-initiates mobility  Transfers                 General transfer comment: attempted, physically resisting  Ambulation/Gait             General Gait Details: unable at eval  Stairs             Wheelchair Mobility    Modified Rankin (Stroke Patients Only)       Balance Overall balance assessment: Mild deficits observed, not formally tested                                           Pertinent Vitals/Pain Pain Assessment: Faces Pain Score: 0-No pain Faces Pain Scale: No hurt Pain Intervention(s): Limited activity within patient's tolerance;Monitored during session    Home Living Family/patient expects to be discharged to:: Assisted living                 Additional Comments: From memory care    Prior Function           Comments: Unsure of PLOF as pt unable to report secondary to cognitive deficits.      Hand Dominance        Extremity/Trunk Assessment   Upper Extremity Assessment Upper Extremity Assessment: Generalized weakness    Lower Extremity Assessment Lower Extremity Assessment: Generalized weakness    Cervical / Trunk Assessment Cervical / Trunk Assessment: Normal  Communication   Communication: No difficulties  Cognition Arousal/Alertness: Awake/alert Behavior During Therapy: WFL for tasks assessed/performed Overall Cognitive Status: History of cognitive impairments - at baseline  General Comments: A&O to self only, tells me that I am wonderful but that I look like a teenage mutant ninja turtle with my mask, also that a truck would be good for me to have. Unable to really follow cues or commands today due to confusion.      General Comments      Exercises     Assessment/Plan    PT Assessment Patient needs continued PT services  PT Problem List Decreased strength;Decreased cognition;Decreased knowledge of use of DME;Decreased safety awareness;Decreased activity tolerance;Decreased mobility       PT Treatment Interventions DME instruction;Balance training;Gait training;Cognitive remediation;Functional mobility training;Patient/family education;Therapeutic  activities;Therapeutic exercise    PT Goals (Current goals can be found in the Care Plan section)  Acute Rehab PT Goals PT Goal Formulation: Patient unable to participate in goal setting Time For Goal Achievement: 01/01/20 Potential to Achieve Goals: Fair    Frequency Min 2X/week   Barriers to discharge        Co-evaluation               AM-PAC PT "6 Clicks" Mobility  Outcome Measure Help needed turning from your back to your side while in a flat bed without using bedrails?: A Little Help needed moving from lying on your back to sitting on the side of a flat bed without using bedrails?: A Lot Help needed moving to and from a bed to a chair (including a wheelchair)?: A Lot Help needed standing up from a chair using your arms (e.g., wheelchair or bedside chair)?: A Lot Help needed to walk in hospital room?: A Lot Help needed climbing 3-5 steps with a railing? : A Lot 6 Click Score: 13    End of Session   Activity Tolerance: Patient tolerated treatment well Patient left: in bed;with call bell/phone within reach;with bed alarm set Nurse Communication: Mobility status PT Visit Diagnosis: Muscle weakness (generalized) (M62.81)    Time: 9528-4132 PT Time Calculation (min) (ACUTE ONLY): 24 min   Charges:   PT Evaluation $PT Eval Moderate Complexity: 1 Mod PT Treatments $Therapeutic Activity: 8-22 mins       Windell Norfolk, DPT, PN1   Supplemental Physical Therapist [place]    Pager [phone] Acute Rehab Office [phone]

## 2019-12-19 LAB — CBC
HCT: 40.9 % (ref 39.0–52.0)
Hemoglobin: 13.3 g/dL (ref 13.0–17.0)
MCH: 30.4 pg (ref 26.0–34.0)
MCHC: 32.5 g/dL (ref 30.0–36.0)
MCV: 93.4 fL (ref 80.0–100.0)
Platelets: 109 10*3/uL — ABNORMAL LOW (ref 150–400)
RBC: 4.38 MIL/uL (ref 4.22–5.81)
RDW: 14.7 % (ref 11.5–15.5)
WBC: 4.1 10*3/uL (ref 4.0–10.5)
nRBC: 0 % (ref 0.0–0.2)

## 2019-12-19 LAB — COMPREHENSIVE METABOLIC PANEL
ALT: 39 U/L (ref 0–44)
AST: 97 U/L — ABNORMAL HIGH (ref 15–41)
Albumin: 3.1 g/dL — ABNORMAL LOW (ref 3.5–5.0)
Alkaline Phosphatase: 44 U/L (ref 38–126)
Anion gap: 8 (ref 5–15)
BUN: 9 mg/dL (ref 8–23)
CO2: 27 mmol/L (ref 22–32)
Calcium: 9.1 mg/dL (ref 8.9–10.3)
Chloride: 105 mmol/L (ref 98–111)
Creatinine, Ser: 1.06 mg/dL (ref 0.61–1.24)
GFR calc Af Amer: >60 mL/min (ref >60)
GFR calc non Af Amer: >60 mL/min (ref >60)
Glucose, Bld: 95 mg/dL (ref 70–99)
Potassium: 3.8 mmol/L (ref 3.5–5.1)
Sodium: 140 mmol/L (ref 135–145)
Total Bilirubin: 0.8 mg/dL (ref 0.3–1.2)
Total Protein: 6.2 g/dL — ABNORMAL LOW (ref 6.5–8.1)

## 2019-12-19 LAB — D-DIMER, QUANTITATIVE: D-Dimer, Quant: 1.08 ug/mL-FEU — ABNORMAL HIGH (ref 0.00–0.50)

## 2019-12-19 LAB — MAGNESIUM: Magnesium: 1.8 mg/dL (ref 1.7–2.4)

## 2019-12-19 LAB — C-REACTIVE PROTEIN: CRP: 1 mg/dL — ABNORMAL HIGH (ref <1.0)

## 2019-12-19 LAB — BRAIN NATRIURETIC PEPTIDE: B Natriuretic Peptide: 55.3 pg/mL (ref 0.0–100.0)

## 2019-12-19 MED ORDER — HALOPERIDOL LACTATE 5 MG/ML IJ SOLN: 2.0000 mg | Freq: Four times a day (QID) | INTRAMUSCULAR | Status: DC | PRN

## 2019-12-19 MED ORDER — POTASSIUM CHLORIDE CRYS ER 20 MEQ PO TBCR
40.0000 meq | EXTENDED_RELEASE_TABLET | Freq: Once | ORAL | Status: DC
  Filled 2019-12-19: qty 2

## 2019-12-19 MED ORDER — QUETIAPINE FUMARATE 25 MG PO TABS
25.0000 mg | ORAL_TABLET | Freq: Every day | ORAL | Status: DC
  Administered 2019-12-19: 25 mg via ORAL
  Filled 2019-12-19: qty 1

## 2019-12-19 MED ORDER — POTASSIUM CHLORIDE 20 MEQ PO PACK
40.0000 meq | PACK | Freq: Once | ORAL | Status: AC
  Administered 2019-12-19: 40 meq via ORAL
  Filled 2019-12-19: qty 2

## 2019-12-19 NOTE — TOC Initial Note (Signed)
Transition of Care [facility]) - Initial/Assessment Note    Patient Details  Name: Ernest Turner MRN: [id number] Date of Birth: May 11, 1940  [facility] CM/SW Contact:    Benard Halsted, [place] Phone Number: 12/19/2019, 12:23 PM  Clinical Narrative:                 CSW spoke with [facility] regarding patient's potential discharge back there tomorrow if stable. She confirmed return COVID+ and will require DC Summary and FL2 faxed to f. [phone] to review medication changes.   CSW spoke with patient's daughter, Ernest Turner, to provide an update and she reported appreciation. She stated the facility administrator told her they would need to figure out how to keep him isolated for 10 days. CSW will touch base with facility again in the morning.   Expected Discharge Plan: Memory Care Barriers to Discharge: Continued Medical Work up   Patient Goals and CMS Choice Patient states their goals for this hospitalization and ongoing recovery are:: Recovery CMS Medicare.gov Compare Post Acute Care list provided to:: Patient Represenative (must comment) (Daughter)    Expected Discharge Plan and Services Expected Discharge Plan: Memory Care In-house Referral: Clinical Social Work     Living arrangements for the past 2 months: [place] (Memory care)                                      Prior Living Arrangements/Services Living arrangements for the past 2 months: [place] (Memory care) Lives with:: Facility Resident Patient language and need for interpreter reviewed:: Yes Do you feel safe going back to the place where you live?: Yes      Need for Family Participation in Patient Care: Yes (Comment) Care giver support system in place?: Yes (comment)   Criminal Activity/Legal Involvement Pertinent to Current Situation/Hospitalization: No - Comment as needed  Activities of Daily Living   ADL Screening (condition at time of  admission) Patient's cognitive ability adequate to safely complete daily activities?: No Does the patient have difficulty concentrating, remembering, or making decisions?: Yes Patient able to express need for assistance with ADLs?: No Does the patient have difficulty dressing or bathing?: Yes Independently performs ADLs?: No Communication: Independent Dressing (OT): Needs assistance Is this a change from baseline?: Pre-admission baseline Grooming: Needs assistance Is this a change from baseline?: Pre-admission baseline Feeding: Needs assistance Is this a change from baseline?: Pre-admission baseline Bathing: Needs assistance Is this a change from baseline?: Pre-admission baseline Toileting: Needs assistance Is this a change from baseline?: Pre-admission baseline In/Out Bed: Needs assistance Is this a change from baseline?: Pre-admission baseline  Permission Sought/Granted Permission sought to share information with : Facility Sport and exercise psychologist, Family Supports Permission granted to share information with : No  Share Information with NAME: Ernest Turner  Permission granted to share info w AGENCY: [place] granted to share info w Relationship: Daughter  Permission granted to share info w Contact Information: [phone]  Emotional Assessment   Attitude/Demeanor/Rapport: Unable to Assess Affect (typically observed): Unable to Assess Orientation: : Oriented to Self Alcohol / Substance Use: Not Applicable Psych Involvement: No (comment)  Admission diagnosis:  Acute encephalopathy [G93.40] COVID-19 [U07.1] Patient Active Problem List   Diagnosis Date Noted  . Acute encephalopathy 12/17/2019  . COVID-19 virus infection 12/17/2019  . Acute metabolic encephalopathy 01/60/1093  . Parapneumonic effusion 10/21/2019  . Pneumonia of left  lower lobe due to infectious organism 10/21/2019  . Lactic acidosis 10/21/2019  . Bacterial conjunctivitis of both eyes 10/21/2019   . Essential hypertension 10/21/2019  . Dementia ([place]) 08/12/2015  . Nausea & vomiting 04/04/2012  . BPH (benign prostatic hyperplasia)   . Hypogonadism male   . Allergy   . Arthritis   . Depression   . GERD (gastroesophageal reflux disease)   . Hypercholesterolemia   . Heart murmur   . Ulcer   . ED (erectile dysfunction)   . Prostate cancer ([place]) 12/26/2011   PCP:  [facility] Pharmacy:  No Pharmacies Listed    Social Determinants of Health (SDOH) Interventions    Readmission Risk Interventions No flowsheet data found.

## 2019-12-19 NOTE — Progress Notes (Signed)
PROGRESS NOTE                                                                                                                                                                                                             Patient Demographics:    Ernest Turner, is a 79 y.o. male, DOB - 06/02/1940, WNI:[id number]  Outpatient Primary MD for the patient is [facility]    LOS - 1  Admit date - 12/17/2019    Chief Complaint  Patient presents with   Altered Mental Status       Brief Narrative  -  Ernest Turner is a 79 y.o. male with medical history significant of dementia, HTN.  Pt presents to ED from memory care after staff noted that he was less responsive than usual today.   Patient was unable to provide any additional history secondary to AMS.   Subjective:   Patient in bed, appears comfortable, denies any headache, no fever, no chest pain or pressure, no shortness of breath , no abdominal pain. No focal weakness.  He is quite confused but able to answer questions although not very reliably.    Assessment  & Plan :     1. Incidental breakthrough COVID-19 infection with possible mild Covid encephalopathy-patient is vaccinated with J&J vaccine, no shortness of breath or infiltrates on chest x-ray, currently on room air and stable, has received monoclonal antibody, will monitor closely.  Encouraged the patient to sit up in chair in the daytime use I-S and flutter valve for pulmonary toiletry and then prone in bed when at night.  Will advance activity and titrate down oxygen as possible.     SpO2: 99 %  Recent Labs  Lab 12/17/19 1948 12/17/19 1950 12/17/19 2021 12/18/19 1417  WBC 5.2  --   --  4.8  PLT 116*  --   --  111*  CRP  --   --   --  0.7  BNP  --   --   --  85.4  DDIMER  --   --   --  1.46*  AST 119*  --   --  120*  ALT 43  --   --  47*  ALKPHOS 51  --   --  51  BILITOT 1.5*  --    --  1.6*  ALBUMIN 3.4*  --   --  3.3*  LATICACIDVEN  --  1.7  --   --   SARSCOV2NAA  --   --  POSITIVE*  --      2.  Dementia with mild acute encephalopathy due to #1 above.  CT head unremarkable, no focal deficits, monitor with supportive care.  Minimize narcotics and benzodiazepine use, remains at risk for delirium, nightly Seroquel and as needed Haldol to be utilized for delirium, this likely will continue while he is in the hospital and family was already warned about this.  3.  Underlying dementia.  Stable continue home dose sertraline.  4.  Essential hypertension.  Continue Norvasc.  5.  BPH.  On Flomax continue.      Condition - Guarded  Family Communication  :  Daughter [name] [phone] on 12/18/19, 12/19/19  Code Status :  DNR  Consults  :  None  Procedures  :  CT Head - Non acute  PUD Prophylaxis : None  Disposition Plan  :    Status is: Inpt  Dispo: The patient is from: SNF              Anticipated d/c is to: SNF              Anticipated d/c date is: 3 days              Patient currently is not medically stable to d/c.   DVT Prophylaxis  :  Lovenox    Lab Results  Component Value Date   PLT 111 (L) 12/18/2019    Diet :  Diet Order            Diet regular Room service appropriate? Yes; Fluid consistency: Thin  Diet effective now                  Inpatient Medications  Scheduled Meds:  amLODipine  10 mg Oral Daily   enoxaparin (LOVENOX) injection  40 mg Subcutaneous Q24H   potassium chloride  40 mEq Oral Once   QUEtiapine  25 mg Oral QHS   sertraline  25 mg Oral QHS   tamsulosin  0.4 mg Oral QPC breakfast   Continuous Infusions:  sodium chloride     PRN Meds:.sodium chloride, acetaminophen **OR** [DISCONTINUED] acetaminophen, albuterol, haloperidol lactate, magnesium hydroxide, [DISCONTINUED] ondansetron **OR** ondansetron (ZOFRAN) IV  Antibiotics  :    Anti-infectives (From admission, onward)   None       Time Spent in  minutes  30   Lala Lund M.D on 12/19/2019 at 9:49 AM  To page go to [email or web address] - password [facility]  [facility] -  Office  [phone]     See all Orders from today for further details    Objective:   Vitals:   12/18/19 1036 12/18/19 1108 12/18/19 2333 12/19/19 0544  BP: 121/60  125/72 115/81  Pulse: 65  67 66  Resp: 16 20 15 18   Temp: 98.9 F (37.2 C) 98.8 F (37.1 C) 98.9 F (37.2 C) 98.7 F (37.1 C)  TempSrc: Oral Oral Axillary Axillary  SpO2: 95% 98% 98% 99%    Wt Readings from Last 3 Encounters:  08/12/15 81.8 kg  10/16/14 80.5 kg  04/29/14 87.5 kg     Intake/Output Summary (Last 24 hours) at 12/19/2019 0949 Last data filed at 12/18/2019 1840 Gross per 24 hour  Intake 220 ml  Output --  Net 220 ml     Physical Exam  Awake Alert, No  new F.N deficits, Normal affect [place].AT,PERRAL Supple Neck,No JVD, No cervical lymphadenopathy appriciated.  Symmetrical Chest wall movement, Good air movement bilaterally, CTAB RRR,No Gallops, Rubs or new Murmurs, No Parasternal Heave +ve B.Sounds, Abd Soft, No tenderness, No organomegaly appriciated, No rebound - guarding or rigidity. No Cyanosis, Clubbing or edema, No new Rash or bruise    Data Review:    CBC Recent Labs  Lab 12/17/19 1948 12/18/19 1417  WBC 5.2 4.8  HGB 13.2 13.4  HCT 40.4 41.5  PLT 116* 111*  MCV 91.0 91.0  MCH 29.7 29.4  MCHC 32.7 32.3  RDW 14.6 14.6  LYMPHSABS 1.2  --   MONOABS 0.5  --   EOSABS 0.0  --   BASOSABS 0.0  --     Recent Labs  Lab 12/17/19 1948 12/17/19 1950 12/18/19 1417  NA 139  --  139  K 4.3  --  3.4*  CL 106  --  103  CO2 23  --  24  GLUCOSE 100*  --  87  BUN 8  --  5*  CREATININE 1.00  --  1.02  CALCIUM 9.1  --  9.1  AST 119*  --  120*  ALT 43  --  47*  ALKPHOS 51  --  51  BILITOT 1.5*  --  1.6*  ALBUMIN 3.4*  --  3.3*  MG  --   --  1.8  CRP  --   --  0.7  DDIMER  --   --  1.46*  LATICACIDVEN  --  1.7  --   BNP  --   --  85.4     ------------------------------------------------------------------------------------------------------------------ No results for input(s): CHOL, HDL, LDLCALC, TRIG, CHOLHDL, LDLDIRECT in the last 72 hours.  No results found for: HGBA1C ------------------------------------------------------------------------------------------------------------------ No results for input(s): TSH, T4TOTAL, T3FREE, THYROIDAB in the last 72 hours.  Invalid input(s): FREET3  Cardiac Enzymes No results for input(s): CKMB, TROPONINI, MYOGLOBIN in the last 168 hours.  Invalid input(s): CK ------------------------------------------------------------------------------------------------------------------    Component Value Date/Time   BNP 85.4 12/18/2019 1417    Micro Results Recent Results (from the past 240 hour(s))  Culture, blood (routine x 2)     Status: None (Preliminary result)   Collection Time: 12/17/19  7:48 PM   Specimen: BLOOD  Result Value Ref Range Status   Specimen Description BLOOD SITE NOT SPECIFIED  Final   Special Requests   Final    BOTTLES DRAWN AEROBIC AND ANAEROBIC Blood Culture results may not be optimal due to an inadequate volume of blood received in culture bottles   Culture   Final    NO GROWTH < 12 HOURS Performed at [facility], [place] [address], [place], [place] [address]    Report Status PENDING  Incomplete  Urine culture     Status: Abnormal   Collection Time: 12/17/19  7:48 PM   Specimen: Urine, Random  Result Value Ref Range Status   Specimen Description URINE, RANDOM  Final   Special Requests NONE  Final   Culture (A)  Final    <10,000 COLONIES/mL INSIGNIFICANT GROWTH Performed at [facility], [place] [address], [place], [place] [address]    Report Status 12/18/2019 FINAL  Final  Culture, blood (routine x 2)     Status: None (Preliminary result)   Collection Time: 12/17/19  8:21 PM   Specimen: BLOOD  Result Value Ref Range Status   Specimen  Description BLOOD SITE NOT SPECIFIED  Final  Special Requests   Final    BOTTLES DRAWN AEROBIC AND ANAEROBIC Blood Culture results may not be optimal due to an inadequate volume of blood received in culture bottles   Culture   Final    NO GROWTH < 12 HOURS Performed at [place] [address] [place], [place] [address]    Report Status PENDING  Incomplete  Respiratory Panel by RT PCR (Flu A&B, Covid) - Nasopharyngeal Swab     Status: Abnormal   Collection Time: 12/17/19  8:21 PM   Specimen: Nasopharyngeal Swab  Result Value Ref Range Status   SARS Coronavirus 2 by RT PCR POSITIVE (A) NEGATIVE Final    Comment: RESULT CALLED TO, READ BACK BY AND VERIFIED WITH: M SCRUGGS RN 12/17/19 AT 2240 SK (NOTE) SARS-CoV-2 target nucleic acids are DETECTED.  SARS-CoV-2 RNA is generally detectable in upper respiratory specimens  during the acute phase of infection. Positive results are indicative of the presence of the identified virus, but do not rule out bacterial infection or co-infection with other pathogens not detected by the test. Clinical correlation with patient history and other diagnostic information is necessary to determine patient infection status. The expected result is Negative.  Fact Sheet for Patients:  PinkCheek.be  Fact Sheet for Healthcare Providers: GravelBags.it  This test is not yet approved or cleared by the [place] FDA and  has been authorized for detection and/or diagnosis of SARS-CoV-2 by FDA under an Emergency Use Authorization (EUA).  This EUA will remain in effect (meaning this test can be [place] ed) for the duration of  the COVID-19 declaration under Section 564(b)(1) of the Act, 21 U.S.C. section 360bbb-3(b)(1), unless the authorization is terminated or revoked sooner.      Influenza A by PCR NEGATIVE NEGATIVE Final   Influenza B by PCR NEGATIVE NEGATIVE Final    Comment: (NOTE) The  Xpert Xpress SARS-CoV-2/FLU/RSV assay is intended as an aid in  the diagnosis of influenza from Nasopharyngeal swab specimens and  should not be used as a sole basis for treatment. Nasal washings and  aspirates are unacceptable for Xpert Xpress SARS-CoV-2/FLU/RSV  testing.  Fact Sheet for Patients: PinkCheek.be  Fact Sheet for Healthcare Providers: GravelBags.it  This test is not yet approved or cleared by the [place] FDA and  has been authorized for detection and/or diagnosis of SARS-CoV-2 by  FDA under an Emergency Use Authorization (EUA). This EUA will remain  in effect (meaning this test can be used) for the duration of the  Covid-19 declaration under Section 564(b)(1) of the Act, 21  U.S.C. section 360bbb-3(b)(1), unless the authorization is  terminated or revoked. Performed at [facility], [place] [address], [place], [place] [address]     Radiology Reports CT Head Wo Contrast  Result Date: 12/17/2019 CLINICAL DATA:  Mental status change, unknown cause. Altered mental status. EXAM: CT HEAD WITHOUT CONTRAST TECHNIQUE: Contiguous axial images were obtained from the base of the skull through the vertex without intravenous contrast. COMPARISON:  Head CT 10/25/2019. FINDINGS: Brain: Mildly motion degraded examination. Stable moderate generalized cerebral atrophy. Stable, mild ill-defined hypoattenuation within the cerebral white matter which is nonspecific, but consistent with chronic small vessel ischemic disease. There is no acute intracranial hemorrhage. No demarcated cortical infarct. No extra-axial fluid collection. No evidence of intracranial mass. No midline shift. Vascular: No hyperdense vessel.  Atherosclerotic calcifications. Skull: Normal. Negative for fracture or focal lesion. Sinuses/Orbits: Redemonstrated chronic medially displaced fracture deformity of the right lamina papyracea.  Visualized orbits show no  acute finding. Mild ethmoid and maxillary sinus mucosal thickening. No significant mastoid effusion. IMPRESSION: Mildly motion degraded examination. No evidence of acute intracranial abnormality. Stable moderate generalized cerebral atrophy and mild chronic small vessel ischemic disease. Mild ethmoid and maxillary sinus mucosal thickening. Redemonstrated chronic medially displaced fracture deformity of the right lamina papyracea. Electronically Signed   By: Kellie Simmering DO   On: 12/17/2019 18:57   DG Chest Port 1 View  Result Date: 12/17/2019 CLINICAL DATA:  Altered mental status EXAM: PORTABLE CHEST 1 VIEW COMPARISON:  10/20/2019,, 07/23/2015, 09/06/2019, 05/28/2018 CT 10/21/2019 FINDINGS: Chronic elevation left diaphragm with large left-sided hernia. Opacity at the left base may reflect chronic atelectasis. The right lung is grossly clear. The cardiomediastinal silhouette appears enlarged but is stable. IMPRESSION: Chronic elevation of left diaphragm with large left-sided hernia and probable chronic atelectasis at the left base. Electronically Signed   By: Donavan Foil M.D.   On: 12/17/2019 18:38

## 2019-12-19 NOTE — Evaluation (Signed)
Occupational Therapy Evaluation Patient Details Name: Ernest Turner MRN: [id number] DOB: 02-09-1941 Today's Date: 12/19/2019    History of Present Illness 79yo male brought to the ED due to decreased responsiveness and AMS. Found to be Covid +. PMH dementia, HOH, HLD, HTN, prostate CA   Clinical Impression   Pt from memory care facility, typically ambulatory. Unable to obtain further PLOF due to pt confusion. Pt presents today, pleasant and laughing during session but unable to consistently follow one-step commands for activities despite various methods to gain participation. Pt overall Max A x 2 for bed mobility and Total A x 2 for sit to stand transfers with handheld assist. Pt able to drink from cup without assistance, too distracted to efficiently wash face when given washcloth. Per nursing staff, pt observed walking in hallway without assistance and has been ambulating to bathroom with staff. Suspect pt capable of completing automatic ADL tasks and at baseline. Please re-consult if needs change. OT to sign off.     Follow Up Recommendations  No OT follow up (Return to memory care unit)    Equipment Recommendations  None recommended by OT    Recommendations for Other Services       Precautions / Restrictions Precautions Precautions: Fall;Other (comment) Precaution Comments: COVID + Restrictions Weight Bearing Restrictions: No      Mobility Bed Mobility Overal bed mobility: Needs Assistance Bed Mobility: Supine to Sit;Sit to Supine     Supine to sit: Mod assist;+2 for physical assistance;+2 for safety/equipment;HOB elevated Sit to supine: Max assist;+2 for safety/equipment;+2 for physical assistance   General bed mobility comments: Mod A x 2 to sit EOB, able to lift trunk but assistance to initiate B LE movement. Max A to return to bed due to pt difficulty following directions and confusion  Transfers Overall transfer level: Needs assistance Equipment used: 2 person  hand held assist Transfers: Sit to/from Stand Sit to Stand: Total assist;+2 physical assistance;+2 safety/equipment         General transfer comment: Overall Total A x 2 for sit to stand - pt pushing back and feet sliding. First attempt, able to stand at few seconds at Max A x 2 but pt resistant to further mobility    Balance Overall balance assessment: Mild deficits observed, not formally tested                                         ADL either performed or assessed with clinical judgement   ADL Overall ADL's : Needs assistance/impaired;At baseline Eating/Feeding: Minimal assistance;Sitting   Grooming: Moderate assistance;Sitting   Upper Body Bathing: Moderate assistance;Sitting   Lower Body Bathing: Maximal assistance;Sit to/from stand   Upper Body Dressing : Moderate assistance;Sitting   Lower Body Dressing: Maximal assistance;Sit to/from stand       Toileting- Water quality scientist and Hygiene: Maximal assistance;Sit to/from stand         General ADL Comments: Unsure of pt's baseline functioning d/t advanced dementia. Suspect that pt's automatic responses to ADLs require less assistance than therapists attempting to gain participation. Per NT, pt observed walking down hallway yesterday unassisted to urinate on the floor.      Vision   Vision Assessment?: No apparent visual deficits     Perception     Praxis      Pertinent Vitals/Pain Pain Assessment: Faces Faces Pain Scale: No hurt     Hand  Dominance     Extremity/Trunk Assessment Upper Extremity Assessment Upper Extremity Assessment: Overall WFL for tasks assessed   Lower Extremity Assessment Lower Extremity Assessment: Defer to PT evaluation   Cervical / Trunk Assessment Cervical / Trunk Assessment: Normal   Communication Communication Communication: Expressive difficulties   Cognition Arousal/Alertness: Awake/alert Behavior During Therapy: WFL for tasks  assessed/performed Overall Cognitive Status: History of cognitive impairments - at baseline                                 General Comments: A&O to self only, <25% of responses appropriate to questions or instructions given. Highly distractible and no consistent following of commands. pleasant and laughing throughout session.    General Comments  Per NT, pt was observed walking down hallway unassisted to urinate in hallway. Has been walking to bathroom with NT today.     Exercises     Shoulder Instructions      Home Living Family/patient expects to be discharged to:: Other (Comment)                                 Additional Comments: From memory care      Prior Functioning/Environment          Comments: Unsure of PLOF as pt unable to report secondary to cognitive deficits.         OT Problem List:        OT Treatment/Interventions:      OT Goals(Current goals can be found in the care plan section) Acute Rehab OT Goals Patient Stated Goal: pt unable to state  OT Frequency:     Barriers to D/C:            Co-evaluation PT/OT/SLP Co-Evaluation/Treatment: Yes Reason for Co-Treatment: Necessary to address cognition/behavior during functional activity;For patient/therapist safety;To address functional/ADL transfers   OT goals addressed during session: ADL's and self-care      AM-PAC OT "6 Clicks" Daily Activity     Outcome Measure Help from another person eating meals?: A Little Help from another person taking care of personal grooming?: A Lot Help from another person toileting, which includes using toliet, bedpan, or urinal?: A Lot Help from another person bathing (including washing, rinsing, drying)?: A Lot Help from another person to put on and taking off regular upper body clothing?: A Lot Help from another person to put on and taking off regular lower body clothing?: A Lot 6 Click Score: 13   End of Session Equipment Utilized  During Treatment: Gait belt Nurse Communication: Mobility status  Activity Tolerance: Other (comment) (limited by confusion) Patient left: in bed;with call bell/phone within reach;with nursing/sitter in room                   Time: 1212-1236 OT Time Calculation (min): 24 min Charges:  OT General Charges $OT Visit: 1 Visit OT Evaluation $OT Eval Moderate Complexity: 1 Mod  Layla Maw, OTR/L  Layla Maw 12/19/2019, 3:18 PM

## 2019-12-19 NOTE — Progress Notes (Signed)
Physical Therapy Treatment Patient Details Name: Ernest Turner MRN: [id number] DOB: 1940-12-30 Today's Date: 12/19/2019    History of Present Illness Pt is a 79 y.o. male admitted from memory care unit on 12/17/19 due to decreased responsiveness and AMS. Tested (+) COVID-19. PMH includes dementia, HOH, HLD, HTN, prostate CA.   PT Comments    Pt pleasant and laughing, but still resistant to mobility with therapy, not following one-step commands despite multiple attempts to gain participation. Required mod-maxA+2 for bed mobility and standing, pt actively resisting movement. Per NT/sitter, has been ambulating to bathroom with staff assist and walked into hallway without assist yesterday. Pt likely functioning near baseline mobility with dementia. Recommend return to memory care unit, no acute PT needs identified. Please reconsult if new needs arise.   Follow Up Recommendations  Other (return to memory care unit)     Equipment Recommendations   (defer)    Recommendations for Other Services       Precautions / Restrictions Precautions Precautions: Fall Precaution Comments: COVID + Restrictions Weight Bearing Restrictions: No    Mobility  Bed Mobility Overal bed mobility: Needs Assistance Bed Mobility: Supine to Sit;Sit to Supine     Supine to sit: Mod assist;+2 for physical assistance;+2 for safety/equipment;HOB elevated Sit to supine: Max assist;+2 for safety/equipment;+2 for physical assistance   General bed mobility comments: Mod A x 2 to sit EOB, able to lift trunk but assistance to initiate B LE movement. Max A to return to bed due to pt difficulty following directions and confusion  Transfers Overall transfer level: Needs assistance Equipment used: 2 person hand held assist Transfers: Sit to/from Stand Sit to Stand: Total assist;+2 physical assistance;+2 safety/equipment         General transfer comment: Overall Total A x 2 for sit to stand - pt pushing back and  feet sliding. First attempt, able to stand at few seconds at Max A x 2 but pt resistant to further mobility  Ambulation/Gait                 Stairs             Wheelchair Mobility    Modified Rankin (Stroke Patients Only)       Balance Overall balance assessment: Mild deficits observed, not formally tested                                          Cognition Arousal/Alertness: Awake/alert Behavior During Therapy: WFL for tasks assessed/performed Overall Cognitive Status: History of cognitive impairments - at baseline                                 General Comments: A&O to self only, <25% of responses appropriate to questions or instructions given. Highly distractible and no consistent following of commands. pleasant and laughing throughout session.       Exercises      General Comments General comments (skin integrity, edema, etc.): Per NT, pt was observed walking down hallway unassisted to urinate in hallway. Has been walking to bathroom with NT today.      Pertinent Vitals/Pain Pain Assessment: Faces Faces Pain Scale: No hurt Pain Intervention(s): Monitored during session    Home Living Family/patient expects to be discharged to:: Other (Comment)  Additional Comments: From memory care    Prior Function        Comments: Unsure of PLOF as pt unable to report secondary to cognitive deficits.    PT Goals (current goals can now be found in the care plan section) Acute Rehab PT Goals Patient Stated Goal: pt unable to state Progress towards PT goals: Not progressing toward goals - comment    Frequency    Min 2X/week      PT Plan Discharge plan needs to be updated    Co-evaluation PT/OT/SLP Co-Evaluation/Treatment: Yes Reason for Co-Treatment: Necessary to address cognition/behavior during functional activity;For patient/therapist safety;To address functional/ADL transfers PT goals addressed  during session: Mobility/safety with mobility OT goals addressed during session: ADL's and self-care      AM-PAC PT "6 Clicks" Mobility   Outcome Measure  Help needed turning from your back to your side while in a flat bed without using bedrails?: A Lot Help needed moving from lying on your back to sitting on the side of a flat bed without using bedrails?: A Lot Help needed moving to and from a bed to a chair (including a wheelchair)?: Total Help needed standing up from a chair using your arms (e.g., wheelchair or bedside chair)?: Total Help needed to walk in hospital room?: Total Help needed climbing 3-5 steps with a railing? : Total 6 Click Score: 8    End of Session Equipment Utilized During Treatment: Gait belt Activity Tolerance: Other (comment) (Limited by cognition) Patient left: in bed;with call bell/phone within reach;with bed alarm set;with nursing/sitter in room Nurse Communication: Mobility status PT Visit Diagnosis: Muscle weakness (generalized) (M62.81)     Time: 3354-5625 PT Time Calculation (min) (ACUTE ONLY): 24 min  Charges:  $Therapeutic Activity: 8-22 mins                    Mabeline Caras, PT, DPT Acute Rehabilitation Services  Pager [phone] Office [facility] 12/19/2019, 4:30 PM

## 2019-12-20 NOTE — Discharge Instructions (Signed)
Follow with Primary MD [facility] in 7 days   Get CBC, CMP, 2 view Chest X ray -  checked next visit within 1 week by Primary MD or ALF MD    Activity: As tolerated with Full fall precautions use walker/cane & assistance as needed  Disposition ALF  Diet: Soft Heart Healthy with feeding assistance and aspiration precautions.   Special Instructions: If you have smoked or chewed Tobacco  in the last 2 yrs please stop smoking, stop any regular Alcohol  and or any Recreational drug use.  On your next visit with your primary care physician please Get Medicines reviewed and adjusted.  Please request your Prim.MD to go over all Hospital Tests and Procedure/Radiological results at the follow up, please get all Hospital records sent to your Prim MD by signing hospital release before you go home.  If you experience worsening of your admission symptoms, develop shortness of breath, life threatening emergency, suicidal or homicidal thoughts you must seek medical attention immediately by calling 911 or calling your MD immediately  if symptoms less severe.  You Must read complete instructions/literature along with all the possible adverse reactions/side effects for all the Medicines you take and that have been prescribed to you. Take any new Medicines after you have completely understood and accpet all the possible adverse reactions/side effects.       Person Under Monitoring Name: Ernest Turner  Location: [place] [address]   CORONAVIRUS DISEASE 2019 (COVID-19) Guidance for Persons Under Investigation You are being tested for the virus that causes coronavirus disease 2019 (COVID-19). Public health actions are necessary to ensure protection of your health and the health of others, and to prevent further spread of infection. COVID-19 is caused by a virus that can cause symptoms, such as fever, cough, and shortness of breath. The primary transmission from  person to person is by coughing or sneezing. On April 19, 2018, the [place] announced a [employer] Emergency of International Concern and on April 20, 2018 the U.S. Department of Health and Human Services declared a public health emergency. If the virus that causesCOVID-19 spreads in the community, it could have severe public health consequences.  As a person under investigation for COVID-19, the [place] advises you to adhere to the following guidance until your test results are reported to you. If your test result is positive, you will receive additional information from your provider and your local health department at that time.   Remain at home until you are cleared by your health provider or public health authorities.   Keep a log of visitors to your home using the form provided. Any visitors to your home must be aware of your isolation status.  If you plan to move to a new address or leave the county, notify the local health department in your county.  Call a doctor or seek care if you have an urgent medical need. Before seeking medical care, call ahead and get instructions from the provider before arriving at the medical office, clinic or hospital. Notify them that you are being tested for the virus that causes COVID-19 so arrangements can be made, as necessary, to prevent transmission to others in the healthcare setting. Next, notify the local health department in your county.  If a medical emergency arises and you need to call 911, inform the first responders that you are being tested for the  virus that causes COVID-19. Next, notify the local health department in your county.  Adhere to all guidance set forth by the [place] for [facility] of patients that is based on guidance from the Center for Disease Control and Prevention with suspected or confirmed COVID-19.  It is provided with this guidance for Persons Under Investigation.  Your health and the health of our community are our top priorities. Public Health officials remain available to provide assistance and counseling to you about COVID-19 and compliance with this guidance.  Provider: ____________________________________________________________ Date: ______/_____/_________  By signing below, you acknowledge that you have read and agree to comply with this Guidance for Persons Under Investigation. ______________________________________________________________ Date: ______/_____/_________  WHO DO I CALL? You can find a list of local health departments here: [email or web address] Health Department: ____________________________________________________________________ Contact Name: ________________________________________________________________________ Telephone: ___________________________________________________________________________  Marice Potter, [place], Communicable Disease Branch COVID-19 Guidance for Persons Under Investigation May 26, 2018

## 2019-12-20 NOTE — Discharge Summary (Signed)
Ernest Turner IFO:[id number] DOB: 04-Dec-1940 DOA: 12/17/2019  PCP: [facility]  Admit date: 12/17/2019  Discharge date: 12/20/2019  Admitted From: ALF  Disposition:  ALF   Recommendations for Outpatient Follow-up:   Follow up with PCP in 1-2 weeks  PCP Please obtain BMP/CBC, 2 view CXR in 1week,  (see Discharge instructions)   PCP Please follow up on the following pending results:    Home Health: None   Equipment/Devices: None  Consultations: None  Discharge Condition: Stable    CODE STATUS: Full    Diet Recommendation: Soft Heart Healthy     Chief Complaint  Patient presents with  . Altered Mental Status     Brief history of present illness from the day of admission and additional interim summary    Ernest Turner a 79 y.o.malewith medical history significant ofdementia, HTN. Pt presented to ED from memory care after staff noted that he was less responsive than usual today.  Patient was unable to provide any additional history secondary to AMS.                                                                 Hospital Course   1. Incidental breakthrough COVID-19 infection with possible mild Covid encephalopathy-patient is vaccinated with J&J vaccine, no shortness of breath or infiltrates on chest x-ray, currently on room air and stable, has received monoclonal antibody, remains asymptomatic and stable from the standpoint.  SpO2: 90 %  Recent Labs  Lab 12/17/19 1948 12/17/19 1950 12/17/19 2021 12/18/19 1417 12/19/19 1141  WBC 5.2  --   --  4.8 4.1  CRP  --   --   --  0.7 1.0*  DDIMER  --   --   --  1.46* 1.08*  BNP  --   --   --  85.4 55.3  LATICACIDVEN  --  1.7  --   --   --   AST 119*  --   --  120* 97*  ALT 43  --   --  47* 39  ALKPHOS 51  --   --  51 44  BILITOT  1.5*  --   --  1.6* 0.8  ALBUMIN 3.4*  --   --  3.3* 3.1*  SARSCOV2NAA  --   --  POSITIVE*  --   --       2.  Dementia with mild acute encephalopathy due to #1 above.  CT head unremarkable, no focal deficits, he developed mild encephalopathy on top of his dementia due to breakthrough otherwise asymptomatic Covid infection, with supportive care much improved, feeding himself, in good mood, pleasantly confused, discharged back on home medications to ALF,.  3.  Underlying dementia.  Stable continue home dose sertraline.  4.  Essential hypertension.  Continue Norvasc.  5.  BPH.  On Flomax continue.    Discharge diagnosis     Principal Problem:   Acute encephalopathy Active Problems:   Dementia ([place])   Essential hypertension   COVID-19 virus infection    Discharge instructions    Discharge Instructions    Discharge instructions   Complete by: As directed    Follow with Primary MD [facility] in 7 days   Get CBC, CMP, 2 view Chest X ray -  checked next visit within 1 week by Primary MD or ALF MD    Activity: As tolerated with Full fall precautions use walker/cane & assistance as needed  Disposition ALF  Diet: Soft Heart Healthy with feeding assistance and aspiration precautions.   Special Instructions: If you have smoked or chewed Tobacco  in the last 2 yrs please stop smoking, stop any regular Alcohol  and or any Recreational drug use.  On your next visit with your primary care physician please Get Medicines reviewed and adjusted.  Please request your Prim.MD to go over all Hospital Tests and Procedure/Radiological results at the follow up, please get all Hospital records sent to your Prim MD by signing hospital release before you go home.  If you experience worsening of your admission symptoms, develop shortness of breath, life threatening emergency, suicidal or homicidal thoughts you must seek medical attention immediately by calling 911 or calling your  MD immediately  if symptoms less severe.  You Must read complete instructions/literature along with all the possible adverse reactions/side effects for all the Medicines you take and that have been prescribed to you. Take any new Medicines after you have completely understood and accpet all the possible adverse reactions/side effects.   Increase activity slowly   Complete by: As directed       Discharge Medications   Allergies as of 12/20/2019      Reactions   Nutritional Supplements Other (See Comments)   Prostate supplement (Daughter is unable to confirm)   Androgel [testosterone] Other (See Comments)   (Daughter is unable to confirm)   Viagra [sildenafil Citrate] Other (See Comments)   (Daughter is unable to confirm)      Medication List    TAKE these medications   alum & mag hydroxide-simeth 200-200-20 MG/5ML suspension Commonly known as: MAALOX/MYLANTA Take 30 mLs by mouth every 6 (six) hours as needed for indigestion or heartburn.   amLODipine 10 MG tablet Commonly known as: NORVASC Take 1 tablet (10 mg total) by mouth daily.   cyanocobalamin 1000 MCG tablet Take 1 tablet (1,000 mcg total) by mouth daily.   docusate sodium 100 MG capsule Commonly known as: COLACE Take 100 mg by mouth daily.   guaifenesin 100 MG/5ML syrup Commonly known as: ROBITUSSIN Take 200 mg by mouth every 6 (six) hours as needed for cough.   haloperidol 1 MG tablet Commonly known as: HALDOL Take 1 mg by mouth 2 (two) times daily.   loperamide 2 MG capsule Commonly known as: IMODIUM Take 2 mg by mouth as needed for diarrhea or loose stools (not to exceed 8 doses).   loratadine 10 MG tablet Commonly known as: CLARITIN Take 10 mg by mouth daily.   magnesium hydroxide 400 MG/5ML suspension Commonly known as: MILK OF MAGNESIA Take 30 mLs by mouth at bedtime as needed for mild constipation.   neomycin-bacitracin-polymyxin [phone] ointment Apply 1 application topically as needed (minor  skin tears or abrasions).   PRESCRIPTION MEDICATION Apply 1 application topically 3 (three) times daily as  needed (agitation/anxiety  (apply to wrist)). Lorazepam gel   PRESCRIPTION MEDICATION Take 118 mLs by mouth in the morning, at noon, in the evening, and at bedtime. Mighty Shake   sertraline 25 MG tablet Commonly known as: ZOLOFT Take 25 mg by mouth at bedtime.   tamsulosin 0.4 MG Caps capsule Commonly known as: FLOMAX Take 1 capsule (0.4 mg total) by mouth daily after breakfast. Take tamsulosin only if having difficulty voiding.        Follow-up Information    [facility]. Schedule an appointment as soon as possible for a visit in 1 week(s).   Specialty: Geriatric Medicine Contact information: [place] [address] [phone]               Major procedures and Radiology Reports - PLEASE review detailed and final reports thoroughly  -        CT Head Wo Contrast  Result Date: 12/17/2019 CLINICAL DATA:  Mental status change, unknown cause. Altered mental status. EXAM: CT HEAD WITHOUT CONTRAST TECHNIQUE: Contiguous axial images were obtained from the base of the skull through the vertex without intravenous contrast. COMPARISON:  Head CT 10/25/2019. FINDINGS: Brain: Mildly motion degraded examination. Stable moderate generalized cerebral atrophy. Stable, mild ill-defined hypoattenuation within the cerebral white matter which is nonspecific, but consistent with chronic small vessel ischemic disease. There is no acute intracranial hemorrhage. No demarcated cortical infarct. No extra-axial fluid collection. No evidence of intracranial mass. No midline shift. Vascular: No hyperdense vessel.  Atherosclerotic calcifications. Skull: Normal. Negative for fracture or focal lesion. Sinuses/Orbits: Redemonstrated chronic medially displaced fracture deformity of the right lamina papyracea. Visualized orbits show no acute finding. Mild ethmoid  and maxillary sinus mucosal thickening. No significant mastoid effusion. IMPRESSION: Mildly motion degraded examination. No evidence of acute intracranial abnormality. Stable moderate generalized cerebral atrophy and mild chronic small vessel ischemic disease. Mild ethmoid and maxillary sinus mucosal thickening. Redemonstrated chronic medially displaced fracture deformity of the right lamina papyracea. Electronically Signed   By: Kellie Simmering DO   On: 12/17/2019 18:57   DG Chest Port 1 View  Result Date: 12/17/2019 CLINICAL DATA:  Altered mental status EXAM: PORTABLE CHEST 1 VIEW COMPARISON:  10/20/2019,, 07/23/2015, 09/06/2019, 05/28/2018 CT 10/21/2019 FINDINGS: Chronic elevation left diaphragm with large left-sided hernia. Opacity at the left base may reflect chronic atelectasis. The right lung is grossly clear. The cardiomediastinal silhouette appears enlarged but is stable. IMPRESSION: Chronic elevation of left diaphragm with large left-sided hernia and probable chronic atelectasis at the left base. Electronically Signed   By: Donavan Foil M.D.   On: 12/17/2019 18:38    Micro Results     Recent Results (from the past 240 hour(s))  Culture, blood (routine x 2)     Status: None (Preliminary result)   Collection Time: 12/17/19  7:48 PM   Specimen: BLOOD  Result Value Ref Range Status   Specimen Description BLOOD SITE NOT SPECIFIED  Final   Special Requests   Final    BOTTLES DRAWN AEROBIC AND ANAEROBIC Blood Culture results may not be optimal due to an inadequate volume of blood received in culture bottles   Culture   Final    NO GROWTH 3 DAYS Performed at [facility], [place] [address] [place], [place] [address]    Report Status PENDING  Incomplete  Urine culture     Status: Abnormal   Collection Time: 12/17/19  7:48 PM   Specimen: Urine, Random  Result  Value Ref Range Status   Specimen Description URINE, RANDOM  Final   Special Requests NONE  Final   Culture (A)  Final     <10,000 COLONIES/mL INSIGNIFICANT GROWTH Performed at [facility], [place] [address], [place], [place] [address]    Report Status 12/18/2019 FINAL  Final  Culture, blood (routine x 2)     Status: None (Preliminary result)   Collection Time: 12/17/19  8:21 PM   Specimen: BLOOD  Result Value Ref Range Status   Specimen Description BLOOD SITE NOT SPECIFIED  Final   Special Requests   Final    BOTTLES DRAWN AEROBIC AND ANAEROBIC Blood Culture results may not be optimal due to an inadequate volume of blood received in culture bottles   Culture   Final    NO GROWTH 3 DAYS Performed at [facility], [place] [address], [place], [place] [address]    Report Status PENDING  Incomplete  Respiratory Panel by RT PCR (Flu A&B, Covid) - Nasopharyngeal Swab     Status: Abnormal   Collection Time: 12/17/19  8:21 PM   Specimen: Nasopharyngeal Swab  Result Value Ref Range Status   SARS Coronavirus 2 by RT PCR POSITIVE (A) NEGATIVE Final    Comment: RESULT CALLED TO, READ BACK BY AND VERIFIED WITH: M SCRUGGS RN 12/17/19 AT 2240 SK (NOTE) SARS-CoV-2 target nucleic acids are DETECTED.  SARS-CoV-2 RNA is generally detectable in upper respiratory specimens  during the acute phase of infection. Positive results are indicative of the presence of the identified virus, but do not rule out bacterial infection or co-infection with other pathogens not detected by the test. Clinical correlation with patient history and other diagnostic information is necessary to determine patient infection status. The expected result is Negative.  Fact Sheet for Patients:  PinkCheek.be  Fact Sheet for Healthcare Providers: GravelBags.it  This test is not yet approved or cleared by the [place] FDA and  has been authorized for detection and/or diagnosis of SARS-CoV-2 by FDA under an Emergency Use Authorization (EUA).  This EUA will remain in effect  (meaning this test can be [place] ed) for the duration of  the COVID-19 declaration under Section 564(b)(1) of the Act, 21 U.S.C. section 360bbb-3(b)(1), unless the authorization is terminated or revoked sooner.      Influenza A by PCR NEGATIVE NEGATIVE Final   Influenza B by PCR NEGATIVE NEGATIVE Final    Comment: (NOTE) The Xpert Xpress SARS-CoV-2/FLU/RSV assay is intended as an aid in  the diagnosis of influenza from Nasopharyngeal swab specimens and  should not be used as a sole basis for treatment. Nasal washings and  aspirates are unacceptable for Xpert Xpress SARS-CoV-2/FLU/RSV  testing.  Fact Sheet for Patients: PinkCheek.be  Fact Sheet for Healthcare Providers: GravelBags.it  This test is not yet approved or cleared by the [place] FDA and  has been authorized for detection and/or diagnosis of SARS-CoV-2 by  FDA under an Emergency Use Authorization (EUA). This EUA will remain  in effect (meaning this test can be used) for the duration of the  Covid-19 declaration under Section 564(b)(1) of the Act, 21  U.S.C. section 360bbb-3(b)(1), unless the authorization is  terminated or revoked. Performed at [facility], [place] [address] [place],  [address]     Today   Subjective    Ernest Turner today has no headache,no chest abdominal pain,no new weakness tingling or numbness, feels much better wants to go home today.  Objective   Blood pressure 137/72, pulse 69, temperature 98.20F , temperature source Axillary, resp. rate 18, SpO2 90 %.   Intake/Output Summary (Last 24 hours) at 12/20/2019 1048 Last data filed at 12/20/2019 0900 Gross per 24 hour  Intake 240 ml  Output 900 ml  Net -660 ml    Exam  Awake but pleasantly confused, No new F.N deficits,   [place].AT,PERRAL Supple Neck,No JVD, No cervical lymphadenopathy appriciated.  Symmetrical Chest wall movement, Good air movement  bilaterally, CTAB RRR,No Gallops,Rubs or new Murmurs, No Parasternal Heave +ve B.Sounds, Abd Soft, Non tender, No organomegaly appriciated, No rebound -guarding or rigidity. No Cyanosis, Clubbing or edema, No new Rash or bruise   Data Review   CBC w Diff:  Lab Results  Component Value Date   WBC 4.1 12/19/2019   HGB 13.3 12/19/2019   HCT 40.9 12/19/2019   PLT 109 (L) 12/19/2019   LYMPHOPCT 23 12/17/2019   MONOPCT 9 12/17/2019   EOSPCT 0 12/17/2019   BASOPCT 0 12/17/2019    CMP:  Lab Results  Component Value Date   NA 140 12/19/2019   K 3.8 12/19/2019   CL 105 12/19/2019   CO2 27 12/19/2019   BUN 9 12/19/2019   CREATININE 1.06 12/19/2019   PROT 6.2 (L) 12/19/2019   ALBUMIN 3.1 (L) 12/19/2019   BILITOT 0.8 12/19/2019   ALKPHOS 44 12/19/2019   AST 97 (H) 12/19/2019   ALT 39 12/19/2019  .   Total Time in preparing paper work, data evaluation and todays exam - 56 minutes  Lala Lund M.D on 12/20/2019 at 10:48 AM  [facility]   Office  [phone]

## 2019-12-20 NOTE — NC FL2 (Signed)
[place] LEVEL OF CARE SCREENING TOOL     IDENTIFICATION  Patient Name: Ernest Turner Birthdate: Jul 28, 1940 Sex: male Admission Date (Current Location): 12/17/2019  [facility] and [place] Number:  Herbalist and Address:  [facility] [place], [place] [address] [place], [place] [address]      Provider Number: [id number]  Attending Physician Name and Address:  Thurnell Lose, MD  Relative Name and Phone Number:  [name], daughter, [phone]    Current Level of Care: Hospital Recommended Level of Care: Memory Care Prior Approval Number:    Date Approved/Denied:   PASRR Number:    Discharge Plan: Other (Comment) (Memory care)    Current Diagnoses: Patient Active Problem List   Diagnosis Date Noted  . Acute encephalopathy 12/17/2019  . COVID-19 virus infection 12/17/2019  . Acute metabolic encephalopathy 15/17/6160  . Parapneumonic effusion 10/21/2019  . Pneumonia of left lower lobe due to infectious organism 10/21/2019  . Lactic acidosis 10/21/2019  . Bacterial conjunctivitis of both eyes 10/21/2019  . Essential hypertension 10/21/2019  . Dementia ([place]) 08/12/2015  . Nausea & vomiting 04/04/2012  . BPH (benign prostatic hyperplasia)   . Hypogonadism male   . Allergy   . Arthritis   . Depression   . GERD (gastroesophageal reflux disease)   . Hypercholesterolemia   . Heart murmur   . Ulcer   . ED (erectile dysfunction)   . Prostate cancer ([place]) 12/26/2011    Orientation RESPIRATION BLADDER Height & Weight     Self  Normal Incontinent Weight:   Height:     BEHAVIORAL SYMPTOMS/MOOD NEUROLOGICAL BOWEL NUTRITION STATUS   (Dementia)   Continent Diet (Full meal-mechanical soft)  AMBULATORY STATUS COMMUNICATION OF NEEDS Skin   Limited Assist (Confusion causes him to not want to move) Verbally Normal                       Personal Care Assistance Level of Assistance  Bathing, Feeding, Dressing Bathing Assistance:  Maximum assistance Feeding assistance: Limited assistance Dressing Assistance: Maximum assistance     Functional Limitations Info  Sight, Hearing, Speech Sight Info: Adequate Hearing Info: Adequate Speech Info: Adequate    SPECIAL CARE FACTORS FREQUENCY                       Contractures Contractures Info: Not present    Additional Factors Info  Code Status, Allergies, Isolation Precautions, Psychotropic Code Status Info: DNR Allergies Info: Nutritional Supplements, Androgel (Testosterone), Viagra (Sildenafil Citrate) Psychotropic Info: Zoloft   Isolation Precautions Info: COVID + on 12/17/19     Current Medications (12/20/2019):    Discharge Medications:  TAKE these medications   alum & mag hydroxide-simeth 200-200-20 MG/5ML suspension Commonly known as: MAALOX/MYLANTA Take 30 mLs by mouth every 6 (six) hours as needed for indigestion or heartburn.   amLODipine 10 MG tablet Commonly known as: NORVASC Take 1 tablet (10 mg total) by mouth daily.   cyanocobalamin 1000 MCG tablet Take 1 tablet (1,000 mcg total) by mouth daily.   docusate sodium 100 MG capsule Commonly known as: COLACE Take 100 mg by mouth daily.   guaifenesin 100 MG/5ML syrup Commonly known as: ROBITUSSIN Take 200 mg by mouth every 6 (six) hours as needed for cough.   haloperidol 1 MG tablet Commonly known as: HALDOL Take 1 mg by mouth 2 (two) times daily.   loperamide 2 MG capsule Commonly known as: IMODIUM Take 2  mg by mouth as needed for diarrhea or loose stools (not to exceed 8 doses).   loratadine 10 MG tablet Commonly known as: CLARITIN Take 10 mg by mouth daily.   magnesium hydroxide 400 MG/5ML suspension Commonly known as: MILK OF MAGNESIA Take 30 mLs by mouth at bedtime as needed for mild constipation.   neomycin-bacitracin-polymyxin [phone] ointment Apply 1 application topically as needed (minor skin tears or abrasions).   PRESCRIPTION MEDICATION Apply 1  application topically 3 (three) times daily as needed (agitation/anxiety  (apply to wrist)). Lorazepam gel   PRESCRIPTION MEDICATION Take 118 mLs by mouth in the morning, at noon, in the evening, and at bedtime. Mighty Shake   sertraline 25 MG tablet Commonly known as: ZOLOFT Take 25 mg by mouth at bedtime.   tamsulosin 0.4 MG Caps capsule Commonly known as: FLOMAX Take 1 capsule (0.4 mg total) by mouth daily after breakfast. Take tamsulosin only if having difficulty voiding.     Relevant Imaging Results:  Relevant Lab Results:   Additional Information SSN: [id number]  Benard Halsted, LCSW

## 2019-12-20 NOTE — TOC Transition Note (Signed)
Transition of Care [facility]) - CM/SW Discharge Note   Patient Details  Name: Ernest Turner MRN: [id number] Date of Birth: 01-Dec-1940  Transition of Care [facility]) CM/SW Contact:  Benard Halsted, LCSW Phone Number: 12/20/2019, 2:13 PM   Clinical Narrative:    Patient will DC to: [place] date: 12/20/19 Family notified: Daughter, Ernest Turner Transport by: Corey Harold   Per MD patient ready for DC to [facility]. RN to call report prior to discharge ([phone]). RN, patient, patient's family, and facility notified of DC. Discharge Summary and FL2 sent to facility. DC packet on chart. Ambulance transport requested for patient.   CSW will sign off for now as social work intervention is no longer needed. Please consult [place] again if new needs arise.      Final next level of care: Memory Care Barriers to Discharge: Barriers Resolved   Patient Goals and CMS Choice Patient states their goals for this hospitalization and ongoing recovery are:: Recovery CMS Medicare.gov Compare Post Acute Care list provided to:: Patient Represenative (must comment) (Daughter)    Discharge Placement                Patient to be transferred to facility by: [place] Name of family member notified: Ernest Turner, daughter Patient and family notified of of transfer: 12/20/19  Discharge Plan and Services In-house Referral: Clinical Social Work                                   Social Determinants of Health ([place]) Interventions     Readmission Risk Interventions No flowsheet data found.

## 2019-12-20 NOTE — TOC Transition Note (Signed)
Transition of Care [facility]) - CM/SW Discharge Note   Patient Details  Name: DEMETRIE BORGE MRN: [id number] Date of Birth: [birth year]  Transition of Care [facility]) CM/SW Contact:  Benard Halsted, LCSW Phone Number: 12/20/2019, 12:41 PM   Clinical Narrative:    CSW faxed FL2 and DC Summary to [place] memory care for review. CSW spoke with Roselyn Reef, RN, and she confirmed that paperwork has been reviewed and patient can return today at any time. CSW will arrange PTAR. Daughter updated.    Final next level of care: Memory Care Barriers to Discharge: Barriers Resolved   Patient Goals and CMS Choice Patient states their goals for this hospitalization and ongoing recovery are:: Recovery CMS Medicare.gov Compare Post Acute Care list provided to:: Patient Represenative (must comment) (Daughter)    Discharge Placement                Patient to be transferred to facility by: [place] Name of family member notified: [name], daughter Patient and family notified of of transfer: 12/20/19  Discharge Plan and Services In-house Referral: Clinical Social Work                                   Social Determinants of Health ([place]) Interventions     Readmission Risk Interventions No flowsheet data found.

## 2019-12-22 LAB — CULTURE, BLOOD (ROUTINE X 2)
Culture: NO GROWTH
Culture: NO GROWTH

## 2019-12-24 DIAGNOSIS — U071 COVID-19: Secondary | ICD-10-CM | POA: Diagnosis not present

## 2020-01-06 ENCOUNTER — Emergency Department (HOSPITAL_COMMUNITY)
Admission: EM | Admit: 2020-01-06 | Discharge: 2020-01-07 | Disposition: A | Discharge status: Home / Self Care | Payer: Medicare Other | Attending: Emergency Medicine | Admitting: Emergency Medicine

## 2020-01-06 ENCOUNTER — Emergency Department (HOSPITAL_COMMUNITY): Payer: Medicare Other

## 2020-01-06 ENCOUNTER — Encounter (HOSPITAL_COMMUNITY): Payer: Self-pay | Admitting: Student

## 2020-01-06 DIAGNOSIS — Z743 Need for continuous supervision: Secondary | ICD-10-CM | POA: Diagnosis not present

## 2020-01-06 DIAGNOSIS — Z87891 Personal history of nicotine dependence: Secondary | ICD-10-CM | POA: Diagnosis not present

## 2020-01-06 DIAGNOSIS — F0390 Unspecified dementia without behavioral disturbance: Secondary | ICD-10-CM | POA: Diagnosis not present

## 2020-01-06 DIAGNOSIS — Y92129 Unspecified place in nursing home as the place of occurrence of the external cause: Secondary | ICD-10-CM | POA: Diagnosis not present

## 2020-01-06 DIAGNOSIS — Z79899 Other long term (current) drug therapy: Secondary | ICD-10-CM | POA: Insufficient documentation

## 2020-01-06 DIAGNOSIS — S0990XA Unspecified injury of head, initial encounter: Secondary | ICD-10-CM | POA: Diagnosis not present

## 2020-01-06 DIAGNOSIS — Z8546 Personal history of malignant neoplasm of prostate: Secondary | ICD-10-CM | POA: Insufficient documentation

## 2020-01-06 DIAGNOSIS — W19XXXA Unspecified fall, initial encounter: Secondary | ICD-10-CM | POA: Insufficient documentation

## 2020-01-06 DIAGNOSIS — S0512XA Contusion of eyeball and orbital tissues, left eye, initial encounter: Secondary | ICD-10-CM | POA: Diagnosis not present

## 2020-01-06 DIAGNOSIS — Z8616 Personal history of COVID-19: Secondary | ICD-10-CM | POA: Insufficient documentation

## 2020-01-06 DIAGNOSIS — I10 Essential (primary) hypertension: Secondary | ICD-10-CM | POA: Diagnosis not present

## 2020-01-06 DIAGNOSIS — Z23 Encounter for immunization: Secondary | ICD-10-CM | POA: Diagnosis not present

## 2020-01-06 DIAGNOSIS — Z043 Encounter for examination and observation following other accident: Secondary | ICD-10-CM | POA: Diagnosis not present

## 2020-01-06 DIAGNOSIS — R58 Hemorrhage, not elsewhere classified: Secondary | ICD-10-CM | POA: Diagnosis not present

## 2020-01-06 DIAGNOSIS — R6889 Other general symptoms and signs: Secondary | ICD-10-CM | POA: Diagnosis not present

## 2020-01-06 DIAGNOSIS — R0902 Hypoxemia: Secondary | ICD-10-CM | POA: Diagnosis not present

## 2020-01-06 MED ORDER — TETANUS-DIPHTH-ACELL PERTUSSIS 5-2.5-18.5 LF-MCG/0.5 IM SUSP
0.5000 mL | Freq: Once | INTRAMUSCULAR | Status: AC
  Administered 2020-01-06: 0.5 mL via INTRAMUSCULAR
  Filled 2020-01-06: qty 0.5

## 2020-01-06 NOTE — ED Triage Notes (Signed)
Pt arrived to ED via ems from nursing facility. Pt was unwitnessed fall, staff reported no LOC, not on any blood thinners hematoma to left eyebrow area. Pt baseline is dementia and has remained his normal.

## 2020-01-06 NOTE — Discharge Instructions (Addendum)
You are seen in the emergency department today after a fall.  The CT of your head and your neck did not show any brain bleeding or fractures of your cervical spine.  There were some degenerative changes.  You do have a hematoma or a swollen bruised area to the left eyebrow area.  You may apply ice wrapped in a towel 20 minutes on 40 minutes off the next 48 hours to this area.  Your tetanus was updated given the cut above your left eyebrow.  Your EKG appeared similar to prior EKGs you have had done.  Please follow-up with your primary care provider within 3 days for general reevaluation as well as a recheck of your blood pressure as was elevated in the ER today.  Return to the emergency department for new or worsening symptoms including but not limited to recurrence of falling, passing out, chest pain, trouble breathing, abnormal behavior, or any other concerns.

## 2020-01-06 NOTE — ED Provider Notes (Signed)
[facility] EMERGENCY DEPARTMENT Provider Note   CSN: [id number] Arrival date & time: 01/06/20  1447     History Chief Complaint  Patient presents with  . Fall    Ernest Turner is a 79 y.o. male with a history of dementia, memory loss, PUD, hypertension, & hyperlipidemia who presents to the ED via EMS from [facility] care S/p unwitnessed fall.   Patient w/ hx of dementia, unable to provide history, denies pain currently- level 5 caveat applies.  Called & spoke with [facility] staff who relayed patient had an unwitnessed fall in the dining hall today. Was conscious when found & at his mental status baseline. He is not on anticoagulation.   HPI     Past Medical History:  Diagnosis Date  . Arthritis   . BPH (benign prostatic hyperplasia)   . Depression   . ED (erectile dysfunction)   . Full dentures   . Heart murmur   . Hemorrhoids   . History of peptic ulcer   . History of radiation therapy to prostate 7800 cGy 40 sessions 04-11-2012 to 06-11-2012   EXTERNAL BEAM PELVIC AREA FOR PROSTATE CANCER  . History of urinary retention   . HOH (hard of hearing)   . Hyperlipidemia   . Hypertension   . Hypogonadism male   . Lower urinary tract symptoms (LUTS)   . Memory difficulty   . Memory loss   . Poor historian   . Prostate cancer [facility]) dx 12/26/11  urologist-  dr Gaynelle Arabian  oncologist-  dr Valere Dross   GOLD SEED IMPLANT  03-07-2012--- Adenocarcinoma,gleason=3+3=6.,& 3+4=7,PSA=15.84--  s/p  external beam radiation 04-11-2012 to 06-11-2012  . Wears glasses     Patient Active Problem List   Diagnosis Date Noted  . Acute encephalopathy 12/17/2019  . COVID-19 virus infection 12/17/2019  . Acute metabolic encephalopathy 23/53/6144  . Parapneumonic effusion 10/21/2019  . Pneumonia of left lower lobe due to infectious organism 10/21/2019  . Lactic acidosis 10/21/2019  . Bacterial conjunctivitis of both eyes 10/21/2019  . Essential hypertension  10/21/2019  . Dementia ([place]) 08/12/2015  . Nausea & vomiting 04/04/2012  . BPH (benign prostatic hyperplasia)   . Hypogonadism male   . Allergy   . Arthritis   . Depression   . GERD (gastroesophageal reflux disease)   . Hypercholesterolemia   . Heart murmur   . Ulcer   . ED (erectile dysfunction)   . Prostate cancer ([place]) 12/26/2011    Past Surgical History:  Procedure Laterality Date  . CIRCUMCISION  09-21-2006  . COLONOSCOPY    . CYSTOSCOPY Right 10/16/2014   Procedure: CYSTO, RIGHT RETROGRADE COOK BALLOON DILATION RIGHT PROXIMAL URETHERAL STRICTURE;  Surgeon: Carolan Clines, MD;  Location: [place];  Service: Urology;  Laterality: Right;  . INGUINAL HERNIA REPAIR Left 04/29/2014   Procedure: LEFT INGUINAL HERNIA REPAIR;  Surgeon: Coralie Keens, MD;  Location: [place];  Service: General;  Laterality: Left;  . INSERTION OF MESH Left 04/29/2014   Procedure: INSERTION OF MESH;  Surgeon: Coralie Keens, MD;  Location: [place];  Service: General;  Laterality: Left;  . MOUTH SURGERY     gum surgery  . MULTIPLE TOOTH EXTRACTIONS    . PROSTATE BIOPSY  12/26/11   PSA=15.84,gleason=3+3=6,& 3+4=7,Volume=27.98cc,Adenocarcinoma       Family History  Problem Relation Age of Onset  . Cancer Sister        liver to brain mets died late 44's  . Cancer Daughter  breast cancer died age 24  . Diabetes Mother     Social History   Tobacco Use  . Smoking status: Former Smoker    Packs/day: 2.00    Years: 20.00    Pack years: 40.00    Types: Cigarettes    Quit date: 01/21/1984    Years since quitting: 35.9  . Smokeless tobacco: Never Used  Substance Use Topics  . Alcohol use: No  . Drug use: No    Home Medications Prior to Admission medications   Medication Sig Start Date End Date Taking? Authorizing Provider  alum & mag hydroxide-simeth (MAALOX/MYLANTA) 200-200-20 MG/5ML suspension Take 30 mLs by mouth every 6 (six) hours as needed for indigestion or  heartburn.    [provider]  amLODipine (NORVASC) 10 MG tablet Take 1 tablet (10 mg total) by mouth daily. 10/24/19   Danford, Suann Larry, MD  docusate sodium (COLACE) 100 MG capsule Take 100 mg by mouth daily.    [provider]  guaifenesin (ROBITUSSIN) 100 MG/5ML syrup Take 200 mg by mouth every 6 (six) hours as needed for cough.    [provider]  haloperidol (HALDOL) 1 MG tablet Take 1 mg by mouth 2 (two) times daily.    [provider]  loperamide (IMODIUM) 2 MG capsule Take 2 mg by mouth as needed for diarrhea or loose stools (not to exceed 8 doses).    [provider]  loratadine (CLARITIN) 10 MG tablet Take 10 mg by mouth daily.    [provider]  magnesium hydroxide (MILK OF MAGNESIA) 400 MG/5ML suspension Take 30 mLs by mouth at bedtime as needed for mild constipation.    [provider]  neomycin-bacitracin-polymyxin (NEOSPORIN) [phone] ointment Apply 1 application topically as needed (minor skin tears or abrasions).    [provider]  PRESCRIPTION MEDICATION Apply 1 application topically 3 (three) times daily as needed (agitation/anxiety  (apply to wrist)). Lorazepam gel    [provider]  PRESCRIPTION MEDICATION Take 118 mLs by mouth in the morning, at noon, in the evening, and at bedtime. Mighty Shake    [provider]  sertraline (ZOLOFT) 25 MG tablet Take 25 mg by mouth at bedtime.    [provider]  tamsulosin (FLOMAX) 0.4 MG CAPS capsule Take 1 capsule (0.4 mg total) by mouth daily after breakfast. Take tamsulosin only if having difficulty voiding. 10/16/14   Carolan Clines, MD  vitamin B-12 1000 MCG tablet Take 1 tablet (1,000 mcg total) by mouth daily. 10/24/19   Danford, Suann Larry, MD    Allergies    Nutritional supplements, Androgel [testosterone], and Viagra [sildenafil citrate]  Review of Systems   Review of Systems  Unable to perform ROS: Dementia     Physical Exam Updated Vital Signs BP (!) 169/94 (BP Location: Left Arm)   Pulse 62   Temp (!) 97.5 F (36.4 C) (Oral)   Resp 14   SpO2 100%   Physical Exam Vitals and nursing note reviewed.  Constitutional:      General: He is not in acute distress.    Appearance: He is not toxic-appearing.  HENT:     Head: No raccoon eyes or Battle's sign.      Ears:     Comments: No hemotympanum.  Eyes:     Extraocular Movements: Extraocular movements intact.     Pupils: Pupils are equal, round, and reactive to light.  Neck:     Comments: In c collar, precautions maintained throughout exam,  no focal midline tenderness or step off.  Cardiovascular:     Rate and Rhythm: Normal rate and regular rhythm.  Pulmonary:     Effort: Pulmonary effort is normal. No respiratory distress.     Breath sounds: Normal breath sounds. No wheezing, rhonchi or rales.  Chest:     Chest wall: No tenderness.  Abdominal:     General: There is no distension.     Palpations: Abdomen is soft.     Tenderness: There is no abdominal tenderness. There is no guarding or rebound.  Musculoskeletal:     Comments: Back: No midline tenderness.  UEs/LEs: Moving all extremities, no focal bony tenderness.   Skin:    General: Skin is warm and dry.  Neurological:     Mental Status: He is alert.     Comments: Alert. Does not answer questions consistently or follow commands consistently. Symmetric grip strength.      ED Results / Procedures / Treatments   Labs (all labs ordered are listed, but only abnormal results are displayed) Labs Reviewed - No data to display  EKG EKG Interpretation  Date/Time:  Monday January 06 2020 18:01:04 EDT Ventricular Rate:  69 PR Interval:  122 QRS Duration: 72 QT Interval:  424 QTC Calculation: 454 R Axis:   63 Text Interpretation: Normal sinus rhythm Nonspecific ST and T wave abnormality Abnormal ECG No significant change since last tracing Confirmed by Gareth Morgan [phone])  on 01/06/2020 6:16:33 PM   Radiology CT Head Wo Contrast  Result Date: 01/06/2020 CLINICAL DATA:  Head trauma.  Unwitnessed fall. EXAM: CT HEAD WITHOUT CONTRAST CT CERVICAL SPINE WITHOUT CONTRAST TECHNIQUE: Multidetector CT imaging of the head and cervical spine was performed following the standard protocol without intravenous contrast. Multiplanar CT image reconstructions of the cervical spine were also generated. COMPARISON:  Head CT 12/17/2019.  Cervical spine CT 10/25/2019. FINDINGS: CT HEAD FINDINGS The study is motion degraded despite repeated imaging attempts. Brain: There is no evidence of an acute infarct, intracranial hemorrhage, mass, midline shift, or extra-axial fluid collection. Cerebral atrophy is unchanged with note made of severe bilateral mesial temporal lobe atrophy. Hypodensities in the cerebral white matter bilaterally are unchanged and nonspecific but compatible with mild chronic small vessel ischemic disease. Vascular: Calcified atherosclerosis at the skull base. No hyperdense vessel. Skull: No acute fracture or suspicious osseous lesion. Sinuses/Orbits: Old right medial orbital fracture. Moderate mucosal thickening in the right frontal and right ethmoid sinuses. Grossly clear mastoid air cells. Other: Small left periorbital hematoma. CT CERVICAL SPINE FINDINGS The study is mildly motion degraded despite repeat imaging. Alignment: Cervical spine straightening. Unchanged trace retrolisthesis of C4 on C5. Skull base and vertebrae: No acute fracture or suspicious osseous lesion. Soft tissues and spinal canal: No prevertebral fluid or swelling. No visible canal hematoma. Disc levels: Moderate median C1-2 arthropathy. Similar appearance of moderate to severe disc space narrowing throughout the cervical spine with sparing of C2-3. Moderate multilevel neural foraminal stenosis due to uncovertebral spurring. Upper chest: Emphysema in the lung apices. Other: Mild calcific atherosclerosis at the  carotid bifurcations. IMPRESSION: 1. No evidence of acute intracranial abnormality. 2. Small left periorbital hematoma. 3. No evidence of acute fracture or traumatic subluxation in the cervical spine. 4. Advanced cervical disc degeneration. 5. Emphysema (ICD10-J43.9). Electronically Signed   By: Logan Bores M.D.   On: 01/06/2020 17:49   CT Cervical Spine Wo Contrast  Result Date: 01/06/2020 CLINICAL DATA:  Head trauma.  Unwitnessed fall. EXAM: CT HEAD WITHOUT CONTRAST  CT CERVICAL SPINE WITHOUT CONTRAST TECHNIQUE: Multidetector CT imaging of the head and cervical spine was performed following the standard protocol without intravenous contrast. Multiplanar CT image reconstructions of the cervical spine were also generated. COMPARISON:  Head CT 12/17/2019.  Cervical spine CT 10/25/2019. FINDINGS: CT HEAD FINDINGS The study is motion degraded despite repeated imaging attempts. Brain: There is no evidence of an acute infarct, intracranial hemorrhage, mass, midline shift, or extra-axial fluid collection. Cerebral atrophy is unchanged with note made of severe bilateral mesial temporal lobe atrophy. Hypodensities in the cerebral white matter bilaterally are unchanged and nonspecific but compatible with mild chronic small vessel ischemic disease. Vascular: Calcified atherosclerosis at the skull base. No hyperdense vessel. Skull: No acute fracture or suspicious osseous lesion. Sinuses/Orbits: Old right medial orbital fracture. Moderate mucosal thickening in the right frontal and right ethmoid sinuses. Grossly clear mastoid air cells. Other: Small left periorbital hematoma. CT CERVICAL SPINE FINDINGS The study is mildly motion degraded despite repeat imaging. Alignment: Cervical spine straightening. Unchanged trace retrolisthesis of C4 on C5. Skull base and vertebrae: No acute fracture or suspicious osseous lesion. Soft tissues and spinal canal: No prevertebral fluid or swelling. No visible canal hematoma. Disc levels:  Moderate median C1-2 arthropathy. Similar appearance of moderate to severe disc space narrowing throughout the cervical spine with sparing of C2-3. Moderate multilevel neural foraminal stenosis due to uncovertebral spurring. Upper chest: Emphysema in the lung apices. Other: Mild calcific atherosclerosis at the carotid bifurcations. IMPRESSION: 1. No evidence of acute intracranial abnormality. 2. Small left periorbital hematoma. 3. No evidence of acute fracture or traumatic subluxation in the cervical spine. 4. Advanced cervical disc degeneration. 5. Emphysema (ICD10-J43.9). Electronically Signed   By: Logan Bores M.D.   On: 01/06/2020 17:49    Procedures Procedures (including critical care time)  Medications Ordered in ED Medications  Tdap (BOOSTRIX) injection 0.5 mL (0.5 mLs Intramuscular Incomplete 01/06/20 1814)    ED Course  I have reviewed the triage vital signs and the nursing notes.  Pertinent labs & imaging results that were available during my care of the patient were reviewed by me and considered in my medical decision making (see chart for details).    MDM Rules/Calculators/A&P                         Patient presents to the ED from [facility] S/p unwitnessed fall.  BP elevated, vitals otherwise fairly unremarkable.  @ mental status baseline per facility.  L superior periorbital hematoma and superficial laceration- does not appear to require repair w/ sutures/staples/skin adhesive. Tetanus to be updated. No obvious other areas of trauma.   Additional history obtained:  Additional history obtained from [employer]. Previous records obtained and reviewed.   I ordered an EKG , personally reviewed & interpreted no significant change since last tracing.  Imaging Studies ordered:  I ordered imaging studies which included Ct head & Cspine, I independently visualized and interpreted imaging which showed no acute traumatic injury.  No signs of serious head/neck/back  injury.  Chest/abdomen nontender.  @ mental status baseline prior to and following fall.  Spoke with patient's daughter regarding event & ED visit today, updated on results & plan of care, & is in agreement.  Patient appears appropriate for discharge at this time. I discussed results, treatment plan, need for follow-up, and return precautions with the patient. Provided opportunity for questions, patient confirmed understanding and is in agreement with plan.   Findings and  plan of care discussed with supervising physician Dr. Billy Fischer who is in agreement.   Portions of this note were generated with [employer]. Dictation errors may occur despite best attempts at proofreading.  Final Clinical Impression(s) / ED Diagnoses Final diagnoses:  Fall, initial encounter  Injury of head, initial encounter    Rx / DC Orders ED Discharge Orders    None       Amaryllis Dyke, PA-C 01/06/20 1925    Gareth Morgan, MD 01/07/20 1457

## 2020-01-07 DIAGNOSIS — M2550 Pain in unspecified joint: Secondary | ICD-10-CM | POA: Diagnosis not present

## 2020-01-07 DIAGNOSIS — Z7401 Bed confinement status: Secondary | ICD-10-CM | POA: Diagnosis not present

## 2020-01-13 DIAGNOSIS — M6281 Muscle weakness (generalized): Secondary | ICD-10-CM | POA: Diagnosis not present

## 2020-01-13 DIAGNOSIS — G301 Alzheimer's disease with late onset: Secondary | ICD-10-CM | POA: Diagnosis not present

## 2020-01-13 DIAGNOSIS — M1990 Unspecified osteoarthritis, unspecified site: Secondary | ICD-10-CM | POA: Diagnosis not present

## 2020-01-13 DIAGNOSIS — R296 Repeated falls: Secondary | ICD-10-CM | POA: Diagnosis not present

## 2020-01-29 DIAGNOSIS — G9341 Metabolic encephalopathy: Secondary | ICD-10-CM | POA: Diagnosis not present

## 2020-01-30 DIAGNOSIS — R2681 Unsteadiness on feet: Secondary | ICD-10-CM | POA: Diagnosis not present

## 2020-01-30 DIAGNOSIS — R2689 Other abnormalities of gait and mobility: Secondary | ICD-10-CM | POA: Diagnosis not present

## 2020-01-31 DIAGNOSIS — R278 Other lack of coordination: Secondary | ICD-10-CM | POA: Diagnosis not present

## 2020-01-31 DIAGNOSIS — M6281 Muscle weakness (generalized): Secondary | ICD-10-CM | POA: Diagnosis not present

## 2020-02-03 DIAGNOSIS — M6281 Muscle weakness (generalized): Secondary | ICD-10-CM | POA: Diagnosis not present

## 2020-02-03 DIAGNOSIS — R278 Other lack of coordination: Secondary | ICD-10-CM | POA: Diagnosis not present

## 2020-02-04 DIAGNOSIS — R2689 Other abnormalities of gait and mobility: Secondary | ICD-10-CM | POA: Diagnosis not present

## 2020-02-04 DIAGNOSIS — M6281 Muscle weakness (generalized): Secondary | ICD-10-CM | POA: Diagnosis not present

## 2020-02-04 DIAGNOSIS — R2681 Unsteadiness on feet: Secondary | ICD-10-CM | POA: Diagnosis not present

## 2020-02-04 DIAGNOSIS — R278 Other lack of coordination: Secondary | ICD-10-CM | POA: Diagnosis not present

## 2020-02-05 DIAGNOSIS — R2681 Unsteadiness on feet: Secondary | ICD-10-CM | POA: Diagnosis not present

## 2020-02-05 DIAGNOSIS — R2689 Other abnormalities of gait and mobility: Secondary | ICD-10-CM | POA: Diagnosis not present

## 2020-02-06 DIAGNOSIS — R2681 Unsteadiness on feet: Secondary | ICD-10-CM | POA: Diagnosis not present

## 2020-02-06 DIAGNOSIS — R2689 Other abnormalities of gait and mobility: Secondary | ICD-10-CM | POA: Diagnosis not present

## 2020-02-07 DIAGNOSIS — R2681 Unsteadiness on feet: Secondary | ICD-10-CM | POA: Diagnosis not present

## 2020-02-07 DIAGNOSIS — R2689 Other abnormalities of gait and mobility: Secondary | ICD-10-CM | POA: Diagnosis not present

## 2020-02-09 DIAGNOSIS — R2681 Unsteadiness on feet: Secondary | ICD-10-CM | POA: Diagnosis not present

## 2020-02-09 DIAGNOSIS — M6281 Muscle weakness (generalized): Secondary | ICD-10-CM | POA: Diagnosis not present

## 2020-02-09 DIAGNOSIS — R2689 Other abnormalities of gait and mobility: Secondary | ICD-10-CM | POA: Diagnosis not present

## 2020-02-09 DIAGNOSIS — R278 Other lack of coordination: Secondary | ICD-10-CM | POA: Diagnosis not present

## 2020-02-10 DIAGNOSIS — R278 Other lack of coordination: Secondary | ICD-10-CM | POA: Diagnosis not present

## 2020-02-10 DIAGNOSIS — M6281 Muscle weakness (generalized): Secondary | ICD-10-CM | POA: Diagnosis not present

## 2020-02-11 DIAGNOSIS — R278 Other lack of coordination: Secondary | ICD-10-CM | POA: Diagnosis not present

## 2020-02-11 DIAGNOSIS — M6281 Muscle weakness (generalized): Secondary | ICD-10-CM | POA: Diagnosis not present

## 2020-02-17 DIAGNOSIS — R2689 Other abnormalities of gait and mobility: Secondary | ICD-10-CM | POA: Diagnosis not present

## 2020-02-17 DIAGNOSIS — R2681 Unsteadiness on feet: Secondary | ICD-10-CM | POA: Diagnosis not present

## 2020-02-18 DIAGNOSIS — R2681 Unsteadiness on feet: Secondary | ICD-10-CM | POA: Diagnosis not present

## 2020-02-18 DIAGNOSIS — R2689 Other abnormalities of gait and mobility: Secondary | ICD-10-CM | POA: Diagnosis not present

## 2020-02-18 DIAGNOSIS — M6281 Muscle weakness (generalized): Secondary | ICD-10-CM | POA: Diagnosis not present

## 2020-02-18 DIAGNOSIS — R278 Other lack of coordination: Secondary | ICD-10-CM | POA: Diagnosis not present

## 2020-02-20 DIAGNOSIS — R278 Other lack of coordination: Secondary | ICD-10-CM | POA: Diagnosis not present

## 2020-02-20 DIAGNOSIS — M6281 Muscle weakness (generalized): Secondary | ICD-10-CM | POA: Diagnosis not present

## 2020-02-21 DIAGNOSIS — R2689 Other abnormalities of gait and mobility: Secondary | ICD-10-CM | POA: Diagnosis not present

## 2020-02-21 DIAGNOSIS — R2681 Unsteadiness on feet: Secondary | ICD-10-CM | POA: Diagnosis not present

## 2020-02-24 DIAGNOSIS — R278 Other lack of coordination: Secondary | ICD-10-CM | POA: Diagnosis not present

## 2020-02-24 DIAGNOSIS — R2689 Other abnormalities of gait and mobility: Secondary | ICD-10-CM | POA: Diagnosis not present

## 2020-02-24 DIAGNOSIS — M6281 Muscle weakness (generalized): Secondary | ICD-10-CM | POA: Diagnosis not present

## 2020-02-24 DIAGNOSIS — R2681 Unsteadiness on feet: Secondary | ICD-10-CM | POA: Diagnosis not present

## 2020-02-25 DIAGNOSIS — M6281 Muscle weakness (generalized): Secondary | ICD-10-CM | POA: Diagnosis not present

## 2020-02-25 DIAGNOSIS — R278 Other lack of coordination: Secondary | ICD-10-CM | POA: Diagnosis not present

## 2020-02-27 DIAGNOSIS — R2689 Other abnormalities of gait and mobility: Secondary | ICD-10-CM | POA: Diagnosis not present

## 2020-02-27 DIAGNOSIS — R2681 Unsteadiness on feet: Secondary | ICD-10-CM | POA: Diagnosis not present

## 2020-02-28 DIAGNOSIS — R2681 Unsteadiness on feet: Secondary | ICD-10-CM | POA: Diagnosis not present

## 2020-02-28 DIAGNOSIS — R2689 Other abnormalities of gait and mobility: Secondary | ICD-10-CM | POA: Diagnosis not present

## 2020-03-03 DIAGNOSIS — M6281 Muscle weakness (generalized): Secondary | ICD-10-CM | POA: Diagnosis not present

## 2020-03-03 DIAGNOSIS — R278 Other lack of coordination: Secondary | ICD-10-CM | POA: Diagnosis not present

## 2020-03-05 DIAGNOSIS — M6281 Muscle weakness (generalized): Secondary | ICD-10-CM | POA: Diagnosis not present

## 2020-03-05 DIAGNOSIS — R278 Other lack of coordination: Secondary | ICD-10-CM | POA: Diagnosis not present

## 2020-03-06 DIAGNOSIS — R2689 Other abnormalities of gait and mobility: Secondary | ICD-10-CM | POA: Diagnosis not present

## 2020-03-06 DIAGNOSIS — R2681 Unsteadiness on feet: Secondary | ICD-10-CM | POA: Diagnosis not present

## 2020-03-09 DIAGNOSIS — R2689 Other abnormalities of gait and mobility: Secondary | ICD-10-CM | POA: Diagnosis not present

## 2020-03-09 DIAGNOSIS — R2681 Unsteadiness on feet: Secondary | ICD-10-CM | POA: Diagnosis not present

## 2020-03-10 DIAGNOSIS — R278 Other lack of coordination: Secondary | ICD-10-CM | POA: Diagnosis not present

## 2020-03-10 DIAGNOSIS — M6281 Muscle weakness (generalized): Secondary | ICD-10-CM | POA: Diagnosis not present

## 2020-03-12 DIAGNOSIS — M6281 Muscle weakness (generalized): Secondary | ICD-10-CM | POA: Diagnosis not present

## 2020-03-12 DIAGNOSIS — R278 Other lack of coordination: Secondary | ICD-10-CM | POA: Diagnosis not present

## 2020-03-13 DIAGNOSIS — R2689 Other abnormalities of gait and mobility: Secondary | ICD-10-CM | POA: Diagnosis not present

## 2020-03-13 DIAGNOSIS — R2681 Unsteadiness on feet: Secondary | ICD-10-CM | POA: Diagnosis not present

## 2020-03-17 DIAGNOSIS — M6281 Muscle weakness (generalized): Secondary | ICD-10-CM | POA: Diagnosis not present

## 2020-03-17 DIAGNOSIS — R2689 Other abnormalities of gait and mobility: Secondary | ICD-10-CM | POA: Diagnosis not present

## 2020-03-17 DIAGNOSIS — R278 Other lack of coordination: Secondary | ICD-10-CM | POA: Diagnosis not present

## 2020-03-17 DIAGNOSIS — R2681 Unsteadiness on feet: Secondary | ICD-10-CM | POA: Diagnosis not present

## 2020-03-19 DIAGNOSIS — R278 Other lack of coordination: Secondary | ICD-10-CM | POA: Diagnosis not present

## 2020-03-19 DIAGNOSIS — M6281 Muscle weakness (generalized): Secondary | ICD-10-CM | POA: Diagnosis not present

## 2020-03-23 DIAGNOSIS — R2689 Other abnormalities of gait and mobility: Secondary | ICD-10-CM | POA: Diagnosis not present

## 2020-03-23 DIAGNOSIS — R2681 Unsteadiness on feet: Secondary | ICD-10-CM | POA: Diagnosis not present

## 2020-03-24 DIAGNOSIS — M6281 Muscle weakness (generalized): Secondary | ICD-10-CM | POA: Diagnosis not present

## 2020-03-24 DIAGNOSIS — R278 Other lack of coordination: Secondary | ICD-10-CM | POA: Diagnosis not present

## 2020-03-25 DIAGNOSIS — R278 Other lack of coordination: Secondary | ICD-10-CM | POA: Diagnosis not present

## 2020-03-25 DIAGNOSIS — M6281 Muscle weakness (generalized): Secondary | ICD-10-CM | POA: Diagnosis not present

## 2020-03-26 DIAGNOSIS — M6281 Muscle weakness (generalized): Secondary | ICD-10-CM | POA: Diagnosis not present

## 2020-03-26 DIAGNOSIS — R278 Other lack of coordination: Secondary | ICD-10-CM | POA: Diagnosis not present

## 2020-03-27 DIAGNOSIS — R2681 Unsteadiness on feet: Secondary | ICD-10-CM | POA: Diagnosis not present

## 2020-03-27 DIAGNOSIS — R2689 Other abnormalities of gait and mobility: Secondary | ICD-10-CM | POA: Diagnosis not present

## 2020-03-30 DIAGNOSIS — R2681 Unsteadiness on feet: Secondary | ICD-10-CM | POA: Diagnosis not present

## 2020-03-30 DIAGNOSIS — R2689 Other abnormalities of gait and mobility: Secondary | ICD-10-CM | POA: Diagnosis not present

## 2020-03-31 DIAGNOSIS — R278 Other lack of coordination: Secondary | ICD-10-CM | POA: Diagnosis not present

## 2020-03-31 DIAGNOSIS — M6281 Muscle weakness (generalized): Secondary | ICD-10-CM | POA: Diagnosis not present

## 2020-04-01 DIAGNOSIS — M6281 Muscle weakness (generalized): Secondary | ICD-10-CM | POA: Diagnosis not present

## 2020-04-01 DIAGNOSIS — R278 Other lack of coordination: Secondary | ICD-10-CM | POA: Diagnosis not present

## 2020-04-02 DIAGNOSIS — R278 Other lack of coordination: Secondary | ICD-10-CM | POA: Diagnosis not present

## 2020-04-02 DIAGNOSIS — M6281 Muscle weakness (generalized): Secondary | ICD-10-CM | POA: Diagnosis not present

## 2020-04-03 DIAGNOSIS — R2681 Unsteadiness on feet: Secondary | ICD-10-CM | POA: Diagnosis not present

## 2020-04-03 DIAGNOSIS — R2689 Other abnormalities of gait and mobility: Secondary | ICD-10-CM | POA: Diagnosis not present

## 2020-04-07 DIAGNOSIS — M6281 Muscle weakness (generalized): Secondary | ICD-10-CM | POA: Diagnosis not present

## 2020-04-07 DIAGNOSIS — R278 Other lack of coordination: Secondary | ICD-10-CM | POA: Diagnosis not present

## 2020-04-08 DIAGNOSIS — M6281 Muscle weakness (generalized): Secondary | ICD-10-CM | POA: Diagnosis not present

## 2020-04-08 DIAGNOSIS — R278 Other lack of coordination: Secondary | ICD-10-CM | POA: Diagnosis not present

## 2020-04-08 DIAGNOSIS — Z03818 Encounter for observation for suspected exposure to other biological agents ruled out: Secondary | ICD-10-CM | POA: Diagnosis not present

## 2020-04-09 DIAGNOSIS — R278 Other lack of coordination: Secondary | ICD-10-CM | POA: Diagnosis not present

## 2020-04-09 DIAGNOSIS — M6281 Muscle weakness (generalized): Secondary | ICD-10-CM | POA: Diagnosis not present

## 2020-04-13 DIAGNOSIS — M6281 Muscle weakness (generalized): Secondary | ICD-10-CM | POA: Diagnosis not present

## 2020-04-13 DIAGNOSIS — R278 Other lack of coordination: Secondary | ICD-10-CM | POA: Diagnosis not present

## 2020-04-13 DIAGNOSIS — R2681 Unsteadiness on feet: Secondary | ICD-10-CM | POA: Diagnosis not present

## 2020-04-13 DIAGNOSIS — R2689 Other abnormalities of gait and mobility: Secondary | ICD-10-CM | POA: Diagnosis not present

## 2020-04-14 DIAGNOSIS — R2689 Other abnormalities of gait and mobility: Secondary | ICD-10-CM | POA: Diagnosis not present

## 2020-04-14 DIAGNOSIS — R2681 Unsteadiness on feet: Secondary | ICD-10-CM | POA: Diagnosis not present

## 2020-04-14 DIAGNOSIS — M6281 Muscle weakness (generalized): Secondary | ICD-10-CM | POA: Diagnosis not present

## 2020-04-14 DIAGNOSIS — R278 Other lack of coordination: Secondary | ICD-10-CM | POA: Diagnosis not present

## 2020-04-16 DIAGNOSIS — M6281 Muscle weakness (generalized): Secondary | ICD-10-CM | POA: Diagnosis not present

## 2020-04-16 DIAGNOSIS — R278 Other lack of coordination: Secondary | ICD-10-CM | POA: Diagnosis not present

## 2020-04-17 DIAGNOSIS — R2681 Unsteadiness on feet: Secondary | ICD-10-CM | POA: Diagnosis not present

## 2020-04-17 DIAGNOSIS — R2689 Other abnormalities of gait and mobility: Secondary | ICD-10-CM | POA: Diagnosis not present

## 2020-04-20 DIAGNOSIS — R278 Other lack of coordination: Secondary | ICD-10-CM | POA: Diagnosis not present

## 2020-04-20 DIAGNOSIS — M6281 Muscle weakness (generalized): Secondary | ICD-10-CM | POA: Diagnosis not present

## 2020-04-20 DIAGNOSIS — R2689 Other abnormalities of gait and mobility: Secondary | ICD-10-CM | POA: Diagnosis not present

## 2020-04-20 DIAGNOSIS — R2681 Unsteadiness on feet: Secondary | ICD-10-CM | POA: Diagnosis not present

## 2020-04-21 DIAGNOSIS — M6281 Muscle weakness (generalized): Secondary | ICD-10-CM | POA: Diagnosis not present

## 2020-04-21 DIAGNOSIS — R278 Other lack of coordination: Secondary | ICD-10-CM | POA: Diagnosis not present

## 2020-04-22 DIAGNOSIS — M6281 Muscle weakness (generalized): Secondary | ICD-10-CM | POA: Diagnosis not present

## 2020-04-22 DIAGNOSIS — R278 Other lack of coordination: Secondary | ICD-10-CM | POA: Diagnosis not present

## 2020-04-24 DIAGNOSIS — R2681 Unsteadiness on feet: Secondary | ICD-10-CM | POA: Diagnosis not present

## 2020-04-24 DIAGNOSIS — R2689 Other abnormalities of gait and mobility: Secondary | ICD-10-CM | POA: Diagnosis not present

## 2020-04-27 DIAGNOSIS — R278 Other lack of coordination: Secondary | ICD-10-CM | POA: Diagnosis not present

## 2020-04-27 DIAGNOSIS — R2681 Unsteadiness on feet: Secondary | ICD-10-CM | POA: Diagnosis not present

## 2020-04-27 DIAGNOSIS — R2689 Other abnormalities of gait and mobility: Secondary | ICD-10-CM | POA: Diagnosis not present

## 2020-04-27 DIAGNOSIS — M6281 Muscle weakness (generalized): Secondary | ICD-10-CM | POA: Diagnosis not present

## 2020-04-28 DIAGNOSIS — M6281 Muscle weakness (generalized): Secondary | ICD-10-CM | POA: Diagnosis not present

## 2020-04-28 DIAGNOSIS — R278 Other lack of coordination: Secondary | ICD-10-CM | POA: Diagnosis not present

## 2020-04-29 DIAGNOSIS — R278 Other lack of coordination: Secondary | ICD-10-CM | POA: Diagnosis not present

## 2020-04-29 DIAGNOSIS — M6281 Muscle weakness (generalized): Secondary | ICD-10-CM | POA: Diagnosis not present

## 2020-05-04 DIAGNOSIS — M6281 Muscle weakness (generalized): Secondary | ICD-10-CM | POA: Diagnosis not present

## 2020-05-04 DIAGNOSIS — R278 Other lack of coordination: Secondary | ICD-10-CM | POA: Diagnosis not present

## 2020-05-05 DIAGNOSIS — M6281 Muscle weakness (generalized): Secondary | ICD-10-CM | POA: Diagnosis not present

## 2020-05-05 DIAGNOSIS — R278 Other lack of coordination: Secondary | ICD-10-CM | POA: Diagnosis not present

## 2020-05-06 DIAGNOSIS — M6281 Muscle weakness (generalized): Secondary | ICD-10-CM | POA: Diagnosis not present

## 2020-05-06 DIAGNOSIS — R278 Other lack of coordination: Secondary | ICD-10-CM | POA: Diagnosis not present

## 2020-05-08 ENCOUNTER — Emergency Department (HOSPITAL_COMMUNITY)
Admission: EM | Admit: 2020-05-08 | Discharge: 2020-05-09 | Disposition: A | Discharge status: Skilled Nursing Facility | Payer: Medicare Other | Attending: Emergency Medicine | Admitting: Emergency Medicine

## 2020-05-08 ENCOUNTER — Emergency Department (HOSPITAL_COMMUNITY): Payer: Medicare Other

## 2020-05-08 DIAGNOSIS — Y92018 Other place in single-family (private) house as the place of occurrence of the external cause: Secondary | ICD-10-CM | POA: Diagnosis not present

## 2020-05-08 DIAGNOSIS — Z8546 Personal history of malignant neoplasm of prostate: Secondary | ICD-10-CM | POA: Diagnosis not present

## 2020-05-08 DIAGNOSIS — Z87891 Personal history of nicotine dependence: Secondary | ICD-10-CM | POA: Insufficient documentation

## 2020-05-08 DIAGNOSIS — Z79899 Other long term (current) drug therapy: Secondary | ICD-10-CM | POA: Diagnosis not present

## 2020-05-08 DIAGNOSIS — Z923 Personal history of irradiation: Secondary | ICD-10-CM | POA: Insufficient documentation

## 2020-05-08 DIAGNOSIS — H0589 Other disorders of orbit: Secondary | ICD-10-CM | POA: Diagnosis not present

## 2020-05-08 DIAGNOSIS — W19XXXA Unspecified fall, initial encounter: Secondary | ICD-10-CM

## 2020-05-08 DIAGNOSIS — W01198A Fall on same level from slipping, tripping and stumbling with subsequent striking against other object, initial encounter: Secondary | ICD-10-CM | POA: Insufficient documentation

## 2020-05-08 DIAGNOSIS — S0003XA Contusion of scalp, initial encounter: Secondary | ICD-10-CM | POA: Diagnosis not present

## 2020-05-08 DIAGNOSIS — M7989 Other specified soft tissue disorders: Secondary | ICD-10-CM | POA: Diagnosis not present

## 2020-05-08 DIAGNOSIS — S0990XA Unspecified injury of head, initial encounter: Secondary | ICD-10-CM | POA: Diagnosis not present

## 2020-05-08 DIAGNOSIS — R6889 Other general symptoms and signs: Secondary | ICD-10-CM | POA: Diagnosis not present

## 2020-05-08 DIAGNOSIS — I10 Essential (primary) hypertension: Secondary | ICD-10-CM | POA: Insufficient documentation

## 2020-05-08 DIAGNOSIS — S0083XA Contusion of other part of head, initial encounter: Secondary | ICD-10-CM | POA: Diagnosis not present

## 2020-05-08 DIAGNOSIS — J0130 Acute sphenoidal sinusitis, unspecified: Secondary | ICD-10-CM | POA: Diagnosis not present

## 2020-05-08 DIAGNOSIS — F0390 Unspecified dementia without behavioral disturbance: Secondary | ICD-10-CM | POA: Insufficient documentation

## 2020-05-08 DIAGNOSIS — Z8616 Personal history of COVID-19: Secondary | ICD-10-CM | POA: Diagnosis not present

## 2020-05-08 DIAGNOSIS — M47812 Spondylosis without myelopathy or radiculopathy, cervical region: Secondary | ICD-10-CM | POA: Diagnosis not present

## 2020-05-08 DIAGNOSIS — I6529 Occlusion and stenosis of unspecified carotid artery: Secondary | ICD-10-CM | POA: Diagnosis not present

## 2020-05-08 DIAGNOSIS — M5020 Other cervical disc displacement, unspecified cervical region: Secondary | ICD-10-CM | POA: Diagnosis not present

## 2020-05-08 DIAGNOSIS — G319 Degenerative disease of nervous system, unspecified: Secondary | ICD-10-CM | POA: Diagnosis not present

## 2020-05-08 DIAGNOSIS — S199XXA Unspecified injury of neck, initial encounter: Secondary | ICD-10-CM | POA: Diagnosis not present

## 2020-05-08 DIAGNOSIS — R0902 Hypoxemia: Secondary | ICD-10-CM | POA: Diagnosis not present

## 2020-05-08 DIAGNOSIS — Z743 Need for continuous supervision: Secondary | ICD-10-CM | POA: Diagnosis not present

## 2020-05-08 DIAGNOSIS — R404 Transient alteration of awareness: Secondary | ICD-10-CM | POA: Diagnosis not present

## 2020-05-08 MED ORDER — HALOPERIDOL 1 MG PO TABS
1.0000 mg | ORAL_TABLET | Freq: Once | ORAL | Status: AC
  Administered 2020-05-08: 1 mg via ORAL
  Filled 2020-05-08: qty 1

## 2020-05-08 NOTE — ED Notes (Signed)
Daughter norberto wishon [phone]

## 2020-05-08 NOTE — ED Notes (Signed)
Pt to CT

## 2020-05-08 NOTE — ED Notes (Signed)
Updated pt daughter, wendy on status

## 2020-05-08 NOTE — ED Notes (Signed)
PTAR has been notified of transport 

## 2020-05-08 NOTE — ED Triage Notes (Signed)
Pt BIB [facility] from [facility]. Per roommate, pt had a witnessed fall. EMS reports pt was in floor and needed help getting up, roommates report slip and fall. Pt has a hx of dementia. Pt endorses no pain. Pt has a hematoma to R forehead. No blood thinners.   EMS VS BP 164/102 HR 80 SpO2 94% RA Temp 97.1

## 2020-05-08 NOTE — ED Notes (Signed)
Pt ambulatory with two-person assist. Pt ambulates with steady gait. Pt resting in stretcher at this time.

## 2020-05-08 NOTE — ED Provider Notes (Signed)
[facility] Provider Note   CSN: [id number] Arrival date & time: 05/08/20  1649  LEVEL 5 CAVEAT - DEMENTIA  History Chief Complaint  Patient presents with  . Fall    [facility] is a 80 y.o. male.  HPI 80 year old male with a history of dementia presents after a fall.  Witnessed by the roommate who saw the patient slip and fall.  Patient has a contusion to the right forehead.  No complaints from the patient. History limited by his dementia.   Past Medical History:  Diagnosis Date  . Arthritis   . BPH (benign prostatic hyperplasia)   . Depression   . ED (erectile dysfunction)   . Full dentures   . Heart murmur   . Hemorrhoids   . History of peptic ulcer   . History of radiation therapy to prostate 7800 cGy 40 sessions 04-11-2012 to 06-11-2012   EXTERNAL BEAM PELVIC AREA FOR PROSTATE CANCER  . History of urinary retention   . HOH (hard of hearing)   . Hyperlipidemia   . Hypertension   . Hypogonadism male   . Lower urinary tract symptoms (LUTS)   . Memory difficulty   . Memory loss   . Poor historian   . Prostate cancer [facility]) dx 12/26/11  urologist-  dr Gaynelle Arabian  oncologist-  dr Valere Dross   GOLD SEED IMPLANT  03-07-2012--- Adenocarcinoma,gleason=3+3=6.,& 3+4=7,PSA=15.84--  s/p  external beam radiation 04-11-2012 to 06-11-2012  . Wears glasses     Patient Active Problem List   Diagnosis Date Noted  . Acute encephalopathy 12/17/2019  . COVID-19 virus infection 12/17/2019  . Acute metabolic encephalopathy 09/38/1829  . Parapneumonic effusion 10/21/2019  . Pneumonia of left lower lobe due to infectious organism 10/21/2019  . Lactic acidosis 10/21/2019  . Bacterial conjunctivitis of both eyes 10/21/2019  . Essential hypertension 10/21/2019  . Dementia ([place]) 08/12/2015  . Nausea & vomiting 04/04/2012  . BPH (benign prostatic hyperplasia)   . Hypogonadism male   . Allergy   . Arthritis   . Depression   . GERD  (gastroesophageal reflux disease)   . Hypercholesterolemia   . Heart murmur   . Ulcer   . ED (erectile dysfunction)   . Prostate cancer ([place]) 12/26/2011    Past Surgical History:  Procedure Laterality Date  . CIRCUMCISION  09-21-2006  . COLONOSCOPY    . CYSTOSCOPY Right 10/16/2014   Procedure: CYSTO, RIGHT RETROGRADE COOK BALLOON DILATION RIGHT PROXIMAL URETHERAL STRICTURE;  Surgeon: Carolan Clines, MD;  Location: [place];  Service: Urology;  Laterality: Right;  . INGUINAL HERNIA REPAIR Left 04/29/2014   Procedure: LEFT INGUINAL HERNIA REPAIR;  Surgeon: Coralie Keens, MD;  Location: [place];  Service: General;  Laterality: Left;  . INSERTION OF MESH Left 04/29/2014   Procedure: INSERTION OF MESH;  Surgeon: Coralie Keens, MD;  Location: [place];  Service: General;  Laterality: Left;  . MOUTH SURGERY     gum surgery  . MULTIPLE TOOTH EXTRACTIONS    . PROSTATE BIOPSY  12/26/11   PSA=15.84,gleason=3+3=6,& 3+4=7,Volume=27.98cc,Adenocarcinoma       Family History  Problem Relation Age of Onset  . Cancer Sister        liver to brain mets died late 66's  . Cancer Daughter        breast cancer died age 19  . Diabetes Mother     Social History   Tobacco Use  . Smoking status: Former Smoker    Packs/day: 2.00  Years: 20.00    Pack years: 40.00    Types: Cigarettes    Quit date: 01/21/1984    Years since quitting: 36.3  . Smokeless tobacco: Never Used  Substance Use Topics  . Alcohol use: No  . Drug use: No    Home Medications Prior to Admission medications   Medication Sig Start Date End Date Taking? Authorizing Provider  acetaminophen (TYLENOL) 500 MG tablet Take 500 mg by mouth every 6 (six) hours as needed (fever 99.5-101 F, minor headache, minor discomfort).   Yes [provider]  alum & mag hydroxide-simeth (MAALOX/MYLANTA) 200-200-20 MG/5ML suspension Take 30 mLs by mouth every 6 (six) hours as needed for indigestion or heartburn.   Yes  [provider]  amLODipine (NORVASC) 10 MG tablet Take 1 tablet (10 mg total) by mouth daily. 10/24/19  Yes Danford, Suann Larry, MD  docusate sodium (COLACE) 100 MG capsule Take 100 mg by mouth daily.   Yes [provider]  guaifenesin (ROBITUSSIN) 100 MG/5ML syrup Take 200 mg by mouth every 6 (six) hours as needed for cough.   Yes [provider]  haloperidol (HALDOL) 1 MG tablet Take 1 mg by mouth 2 (two) times daily.   Yes [provider]  loperamide (IMODIUM) 2 MG capsule Take 2 mg by mouth as needed for diarrhea or loose stools (not to exceed 8 doses).   Yes [provider]  loratadine (CLARITIN) 10 MG tablet Take 10 mg by mouth daily.   Yes [provider]  magnesium hydroxide (MILK OF MAGNESIA) 400 MG/5ML suspension Take 30 mLs by mouth at bedtime as needed for mild constipation.   Yes [provider]  Neomycin-Bacitracin-Polymyxin (TRIPLE ANTIBIOTIC) [phone] OINT Apply 1 application topically 2 (two) times daily as needed (minor skin tears or abrasians).   Yes [provider]  Nutritional Supplements (NUTRITIONAL SUPPLEMENT PO) Take 118 mLs by mouth 4 (four) times daily.   Yes [provider]  sertraline (ZOLOFT) 25 MG tablet Take 25 mg by mouth at bedtime.   Yes [provider]  tamsulosin (FLOMAX) 0.4 MG CAPS capsule Take 1 capsule (0.4 mg total) by mouth daily after breakfast. Take tamsulosin only if having difficulty voiding. Patient taking differently: Take 0.4 mg by mouth daily as needed (difficulty voiding). 10/16/14  Yes Carolan Clines, MD  vitamin B-12 1000 MCG tablet Take 1 tablet (1,000 mcg total) by mouth daily. 10/24/19  Yes Danford, Suann Larry, MD    Allergies    Nutritional supplements, Androgel [testosterone], and Viagra [sildenafil citrate]  Review of Systems   Review of Systems  Unable to perform ROS: Dementia    Physical Exam Updated Vital Signs BP (!) 173/83    Pulse 72   Temp 97.9 F (36.6 C)   Resp 16   Ht 5\' 9"  (1.753 m)   Wt 86.2 kg   SpO2 99%   BMI 28.06 kg/m   Physical Exam Vitals and nursing note reviewed.  Constitutional:      Appearance: He is well-developed and well-nourished.  HENT:     Head: Normocephalic. Contusion present.      Right Ear: External ear normal.     Left Ear: External ear normal.     Nose: Nose normal.  Eyes:     General:        Right eye: No discharge.        Left eye: No discharge.     Pupils: Pupils are equal, round, and reactive to light.  Cardiovascular:     Rate and Rhythm: Normal rate and regular rhythm.     Heart sounds: Normal heart sounds.  Pulmonary:     Effort: Pulmonary effort is normal.     Breath sounds: Normal breath sounds.  Abdominal:     Palpations: Abdomen is soft.     Tenderness: There is no abdominal tenderness.  Musculoskeletal:        General: No edema.     Cervical back: Neck supple. No rigidity or tenderness.     Thoracic back: No tenderness.     Lumbar back: No tenderness.     Comments: No tenderness to hips/pelvis.  Skin:    General: Skin is warm and dry.  Neurological:     Mental Status: He is alert.  Psychiatric:        Mood and Affect: Mood is not anxious.     ED Results / Procedures / Treatments   Labs (all labs ordered are listed, but only abnormal results are displayed) Labs Reviewed - No data to display  EKG None  Radiology CT Head Wo Contrast  Result Date: 05/08/2020 CLINICAL DATA:  Head trauma EXAM: CT HEAD WITHOUT CONTRAST TECHNIQUE: Contiguous axial images were obtained from the base of the skull through the vertex without intravenous contrast. COMPARISON:  05/08/2020, 01/06/2020 head CT FINDINGS: Brain: No acute territorial infarction, hemorrhage or intracranial mass. Advanced atrophy. Mild hypodensity in the white matter consistent with chronic small vessel ischemic change. Vascular: No hyperdense vessels.  Carotid vascular calcification. Skull:  Normal. Negative for fracture or focal lesion. Sinuses/Orbits: Chronic fracture deformity medial wall right orbit. Mucosal thickening in the ethmoid and maxillary sinuses Other: Small right forehead scalp hematoma IMPRESSION: 1. No CT evidence for acute intracranial abnormality. 2. Atrophy and mild chronic small vessel ischemic change of the white matter. Electronically Signed   By: Donavan Foil M.D.   On: 05/08/2020 19:53   CT Head Wo Contrast  Result Date: 05/08/2020 CLINICAL DATA:  Head trauma, minor. Neck trauma. Additional history provided: Witnessed fall. EXAM: CT HEAD WITHOUT CONTRAST CT CERVICAL SPINE WITHOUT CONTRAST TECHNIQUE: Multidetector CT imaging of the head and cervical spine was performed following the standard protocol without intravenous contrast. Multiplanar CT image reconstructions of the cervical spine were also generated. COMPARISON:  CT head/cervical spine 01/06/2020. FINDINGS: CT HEAD FINDINGS Brain: The examination is significantly motion degraded, most notably at the level of the lower cerebrum, posterior fossa, skull base, orbits and face. This limits evaluation. Redemonstrated advanced cerebral atrophy with a medial temporal lobe predominance. No acute intracranial hemorrhage or demarcated cortical infarct is identified. No appreciable extra-axial fluid collection. No evidence of intracranial mass. No midline shift Vascular: No appreciable hyperdense vessel. Atherosclerotic calcifications. Skull: No calvarial fracture is identified. Sinuses/Orbits: No appreciable acute orbital abnormality. Frothy secretions within the right sphenoid sinus. Mild left maxillary sinus mucosal thickening at the imaged levels. Small right maxillary sinus mucous retention cyst. Other: Right forehead soft tissue swelling. CT CERVICAL SPINE FINDINGS Mildly motion degraded exam. Alignment: Cervical levocurvature. Straightening of the expected cervical lordosis. No significant spondylolisthesis. Skull base  and vertebrae: The basion-dental and atlanto-dental intervals are maintained.No evidence of acute fracture to the cervical spine. Congenital nonunion of the posterior arch of C1. Soft tissues and spinal canal: No prevertebral fluid or swelling. No visible canal hematoma. Disc levels: Redemonstrated advanced cervical spondylosis with multilevel disc space narrowing, disc bulges, posterior disc osteophytes and uncovertebral hypertrophy. Upper chest: No consolidation within the imaged lung apices. No visible  pneumothorax. Redemonstrated right apical bulla. IMPRESSION: CT head: 1. The examination is significantly motion degraded, most notably at the level of the lower cerebrum, posterior fossa, skull base, orbits and face limiting evaluation for acute posttraumatic findings. 2. No acute intracranial abnormality is identified. 3. Right forehead soft tissue swelling. 4. Redemonstrated advanced cerebral atrophy with a medial temporal lobe predominance. 5. Paranasal sinus disease as described. CT cervical spine: 1. No evidence of acute fracture to the cervical spine. 2. Nonspecific straightening of the expected cervical lordosis. 3. Cervical levocurvature. 4. Redemonstrated advanced cervical spondylosis. Electronically Signed   By: Kellie Simmering DO   On: 05/08/2020 18:45   CT Cervical Spine Wo Contrast  Result Date: 05/08/2020 CLINICAL DATA:  Head trauma, minor. Neck trauma. Additional history provided: Witnessed fall. EXAM: CT HEAD WITHOUT CONTRAST CT CERVICAL SPINE WITHOUT CONTRAST TECHNIQUE: Multidetector CT imaging of the head and cervical spine was performed following the standard protocol without intravenous contrast. Multiplanar CT image reconstructions of the cervical spine were also generated. COMPARISON:  CT head/cervical spine 01/06/2020. FINDINGS: CT HEAD FINDINGS Brain: The examination is significantly motion degraded, most notably at the level of the lower cerebrum, posterior fossa, skull base, orbits and  face. This limits evaluation. Redemonstrated advanced cerebral atrophy with a medial temporal lobe predominance. No acute intracranial hemorrhage or demarcated cortical infarct is identified. No appreciable extra-axial fluid collection. No evidence of intracranial mass. No midline shift Vascular: No appreciable hyperdense vessel. Atherosclerotic calcifications. Skull: No calvarial fracture is identified. Sinuses/Orbits: No appreciable acute orbital abnormality. Frothy secretions within the right sphenoid sinus. Mild left maxillary sinus mucosal thickening at the imaged levels. Small right maxillary sinus mucous retention cyst. Other: Right forehead soft tissue swelling. CT CERVICAL SPINE FINDINGS Mildly motion degraded exam. Alignment: Cervical levocurvature. Straightening of the expected cervical lordosis. No significant spondylolisthesis. Skull base and vertebrae: The basion-dental and atlanto-dental intervals are maintained.No evidence of acute fracture to the cervical spine. Congenital nonunion of the posterior arch of C1. Soft tissues and spinal canal: No prevertebral fluid or swelling. No visible canal hematoma. Disc levels: Redemonstrated advanced cervical spondylosis with multilevel disc space narrowing, disc bulges, posterior disc osteophytes and uncovertebral hypertrophy. Upper chest: No consolidation within the imaged lung apices. No visible pneumothorax. Redemonstrated right apical bulla. IMPRESSION: CT head: 1. The examination is significantly motion degraded, most notably at the level of the lower cerebrum, posterior fossa, skull base, orbits and face limiting evaluation for acute posttraumatic findings. 2. No acute intracranial abnormality is identified. 3. Right forehead soft tissue swelling. 4. Redemonstrated advanced cerebral atrophy with a medial temporal lobe predominance. 5. Paranasal sinus disease as described. CT cervical spine: 1. No evidence of acute fracture to the cervical spine. 2.  Nonspecific straightening of the expected cervical lordosis. 3. Cervical levocurvature. 4. Redemonstrated advanced cervical spondylosis. Electronically Signed   By: Kellie Simmering DO   On: 05/08/2020 18:45    Procedures Procedures   Medications Ordered in ED Medications  haloperidol (HALDOL) tablet 1 mg (1 mg Oral Given 05/08/20 1908)    ED Course  I have reviewed the triage vital signs and the nursing notes.  Pertinent labs & imaging results that were available during my care of the patient were reviewed by me and considered in my medical decision making (see chart for details).    MDM Rules/Calculators/A&P                          Patient has history of  falls. VS are unremarkable besides some HTN later in course. Overall head CT no helpful the first time. CT of c-spine is benign. Given this head CT obtained again after his night time haldol, and is of better quality. No fever. Appears stable for d/c back to facility Final Clinical Impression(s) / ED Diagnoses Final diagnoses:  Fall, initial encounter  Contusion of forehead, initial encounter    Rx / DC Orders ED Discharge Orders    None       Sherwood Gambler, MD 05/09/20 0021

## 2020-05-08 NOTE — ED Notes (Signed)
Pt trying to get out the end of the bed, confused, repositioned at this time

## 2020-05-08 NOTE — Discharge Instructions (Signed)
If you develop continued, recurrent, or worsening headache, fever, neck stiffness, vomiting, blurry or double vision, weakness or numbness in your arms or legs, trouble speaking, or any other new/concerning symptoms then return to the ER for evaluation.  

## 2020-05-08 NOTE — ED Notes (Signed)
Attempted to contact daughter at number listed, went to voicemail

## 2020-05-08 NOTE — ED Notes (Signed)
Please call daug. [name] with an update [phone]

## 2020-05-09 DIAGNOSIS — R279 Unspecified lack of coordination: Secondary | ICD-10-CM | POA: Diagnosis not present

## 2020-05-09 DIAGNOSIS — Z743 Need for continuous supervision: Secondary | ICD-10-CM | POA: Diagnosis not present

## 2020-05-09 DIAGNOSIS — R5381 Other malaise: Secondary | ICD-10-CM | POA: Diagnosis not present

## 2020-05-09 DIAGNOSIS — W19XXXA Unspecified fall, initial encounter: Secondary | ICD-10-CM | POA: Diagnosis not present

## 2020-05-11 DIAGNOSIS — R278 Other lack of coordination: Secondary | ICD-10-CM | POA: Diagnosis not present

## 2020-05-11 DIAGNOSIS — G301 Alzheimer's disease with late onset: Secondary | ICD-10-CM | POA: Diagnosis not present

## 2020-05-11 DIAGNOSIS — M6281 Muscle weakness (generalized): Secondary | ICD-10-CM | POA: Diagnosis not present

## 2020-05-11 DIAGNOSIS — R296 Repeated falls: Secondary | ICD-10-CM | POA: Diagnosis not present

## 2020-05-11 DIAGNOSIS — Z741 Need for assistance with personal care: Secondary | ICD-10-CM | POA: Diagnosis not present

## 2020-05-12 DIAGNOSIS — R278 Other lack of coordination: Secondary | ICD-10-CM | POA: Diagnosis not present

## 2020-05-12 DIAGNOSIS — M6281 Muscle weakness (generalized): Secondary | ICD-10-CM | POA: Diagnosis not present

## 2020-05-13 DIAGNOSIS — R278 Other lack of coordination: Secondary | ICD-10-CM | POA: Diagnosis not present

## 2020-05-13 DIAGNOSIS — M6281 Muscle weakness (generalized): Secondary | ICD-10-CM | POA: Diagnosis not present

## 2020-05-18 DIAGNOSIS — R278 Other lack of coordination: Secondary | ICD-10-CM | POA: Diagnosis not present

## 2020-05-18 DIAGNOSIS — M6281 Muscle weakness (generalized): Secondary | ICD-10-CM | POA: Diagnosis not present

## 2020-05-19 DIAGNOSIS — M6281 Muscle weakness (generalized): Secondary | ICD-10-CM | POA: Diagnosis not present

## 2020-05-19 DIAGNOSIS — R278 Other lack of coordination: Secondary | ICD-10-CM | POA: Diagnosis not present

## 2020-05-20 DIAGNOSIS — M6281 Muscle weakness (generalized): Secondary | ICD-10-CM | POA: Diagnosis not present

## 2020-05-20 DIAGNOSIS — R278 Other lack of coordination: Secondary | ICD-10-CM | POA: Diagnosis not present

## 2020-05-26 DIAGNOSIS — M6281 Muscle weakness (generalized): Secondary | ICD-10-CM | POA: Diagnosis not present

## 2020-05-26 DIAGNOSIS — R278 Other lack of coordination: Secondary | ICD-10-CM | POA: Diagnosis not present

## 2020-05-27 DIAGNOSIS — M6281 Muscle weakness (generalized): Secondary | ICD-10-CM | POA: Diagnosis not present

## 2020-05-27 DIAGNOSIS — R278 Other lack of coordination: Secondary | ICD-10-CM | POA: Diagnosis not present

## 2020-06-01 DIAGNOSIS — M6281 Muscle weakness (generalized): Secondary | ICD-10-CM | POA: Diagnosis not present

## 2020-06-01 DIAGNOSIS — R278 Other lack of coordination: Secondary | ICD-10-CM | POA: Diagnosis not present

## 2020-06-02 DIAGNOSIS — M6281 Muscle weakness (generalized): Secondary | ICD-10-CM | POA: Diagnosis not present

## 2020-06-02 DIAGNOSIS — R278 Other lack of coordination: Secondary | ICD-10-CM | POA: Diagnosis not present

## 2020-06-04 DIAGNOSIS — R278 Other lack of coordination: Secondary | ICD-10-CM | POA: Diagnosis not present

## 2020-06-04 DIAGNOSIS — M6281 Muscle weakness (generalized): Secondary | ICD-10-CM | POA: Diagnosis not present

## 2020-06-08 DIAGNOSIS — M6281 Muscle weakness (generalized): Secondary | ICD-10-CM | POA: Diagnosis not present

## 2020-06-08 DIAGNOSIS — R278 Other lack of coordination: Secondary | ICD-10-CM | POA: Diagnosis not present

## 2020-06-10 DIAGNOSIS — M6281 Muscle weakness (generalized): Secondary | ICD-10-CM | POA: Diagnosis not present

## 2020-06-10 DIAGNOSIS — R278 Other lack of coordination: Secondary | ICD-10-CM | POA: Diagnosis not present

## 2020-06-11 DIAGNOSIS — R278 Other lack of coordination: Secondary | ICD-10-CM | POA: Diagnosis not present

## 2020-06-11 DIAGNOSIS — M6281 Muscle weakness (generalized): Secondary | ICD-10-CM | POA: Diagnosis not present

## 2020-06-15 DIAGNOSIS — R278 Other lack of coordination: Secondary | ICD-10-CM | POA: Diagnosis not present

## 2020-06-15 DIAGNOSIS — M6281 Muscle weakness (generalized): Secondary | ICD-10-CM | POA: Diagnosis not present

## 2020-09-07 DIAGNOSIS — Z87440 Personal history of urinary (tract) infections: Secondary | ICD-10-CM | POA: Diagnosis not present

## 2020-09-07 DIAGNOSIS — G301 Alzheimer's disease with late onset: Secondary | ICD-10-CM | POA: Diagnosis not present

## 2020-10-26 DIAGNOSIS — G301 Alzheimer's disease with late onset: Secondary | ICD-10-CM | POA: Diagnosis not present

## 2020-12-07 DIAGNOSIS — G301 Alzheimer's disease with late onset: Secondary | ICD-10-CM | POA: Diagnosis not present

## 2020-12-17 DIAGNOSIS — R2681 Unsteadiness on feet: Secondary | ICD-10-CM | POA: Diagnosis not present

## 2020-12-17 DIAGNOSIS — R262 Difficulty in walking, not elsewhere classified: Secondary | ICD-10-CM | POA: Diagnosis not present

## 2020-12-21 DIAGNOSIS — R2681 Unsteadiness on feet: Secondary | ICD-10-CM | POA: Diagnosis not present

## 2020-12-21 DIAGNOSIS — R262 Difficulty in walking, not elsewhere classified: Secondary | ICD-10-CM | POA: Diagnosis not present

## 2020-12-22 DIAGNOSIS — R262 Difficulty in walking, not elsewhere classified: Secondary | ICD-10-CM | POA: Diagnosis not present

## 2020-12-22 DIAGNOSIS — R2681 Unsteadiness on feet: Secondary | ICD-10-CM | POA: Diagnosis not present

## 2020-12-24 DIAGNOSIS — R2681 Unsteadiness on feet: Secondary | ICD-10-CM | POA: Diagnosis not present

## 2020-12-24 DIAGNOSIS — R262 Difficulty in walking, not elsewhere classified: Secondary | ICD-10-CM | POA: Diagnosis not present

## 2020-12-28 DIAGNOSIS — R262 Difficulty in walking, not elsewhere classified: Secondary | ICD-10-CM | POA: Diagnosis not present

## 2020-12-28 DIAGNOSIS — R2681 Unsteadiness on feet: Secondary | ICD-10-CM | POA: Diagnosis not present

## 2020-12-29 DIAGNOSIS — R2681 Unsteadiness on feet: Secondary | ICD-10-CM | POA: Diagnosis not present

## 2020-12-29 DIAGNOSIS — R262 Difficulty in walking, not elsewhere classified: Secondary | ICD-10-CM | POA: Diagnosis not present

## 2021-02-08 DIAGNOSIS — F02818 Dementia in other diseases classified elsewhere, unspecified severity, with other behavioral disturbance: Secondary | ICD-10-CM | POA: Diagnosis not present

## 2021-02-08 DIAGNOSIS — N390 Urinary tract infection, site not specified: Secondary | ICD-10-CM | POA: Diagnosis not present

## 2021-02-08 DIAGNOSIS — G301 Alzheimer's disease with late onset: Secondary | ICD-10-CM | POA: Diagnosis not present

## 2021-02-22 DIAGNOSIS — F02818 Dementia in other diseases classified elsewhere, unspecified severity, with other behavioral disturbance: Secondary | ICD-10-CM | POA: Diagnosis not present

## 2021-02-22 DIAGNOSIS — R011 Cardiac murmur, unspecified: Secondary | ICD-10-CM | POA: Diagnosis not present

## 2021-02-22 DIAGNOSIS — G301 Alzheimer's disease with late onset: Secondary | ICD-10-CM | POA: Diagnosis not present

## 2021-02-22 DIAGNOSIS — M6281 Muscle weakness (generalized): Secondary | ICD-10-CM | POA: Diagnosis not present

## 2021-02-24 IMAGING — CT CT HEAD W/O CM
3 of 11 series · 13 of 47 positions shown, 16 images · non-contrast
Comparison: Head CT 12/17/2019.  Cervical spine CT 10/25/2019.

CLINICAL DATA: Head trauma.  Unwitnessed fall.

EXAM:
CT HEAD WITHOUT CONTRAST
CT CERVICAL SPINE WITHOUT CONTRAST
TECHNIQUE: Multidetector CT imaging of the head and cervical spine was
performed following the standard protocol without intravenous
contrast. Multiplanar CT image reconstructions of the cervical spine
were also generated.

[Series 6: head 2.0 h70h · axial · 0.45mm/px · z∈[-130,-0]mm · 9 of 83 slices shown, 12 images]
[im 9/83  brain]
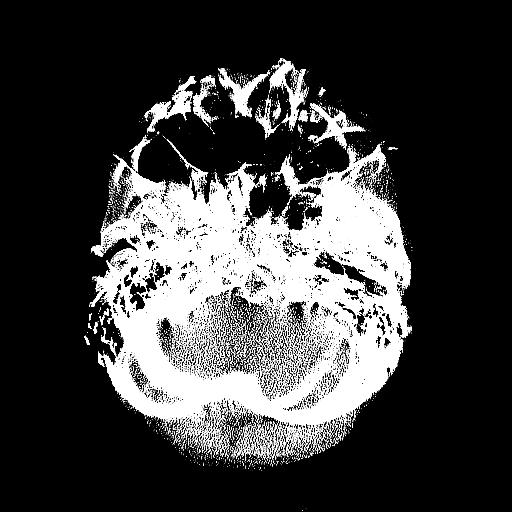
[im 9/83  bone]
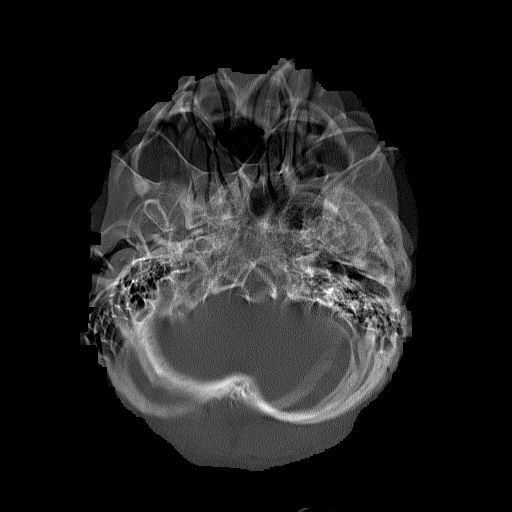
[im 17/83  brain]
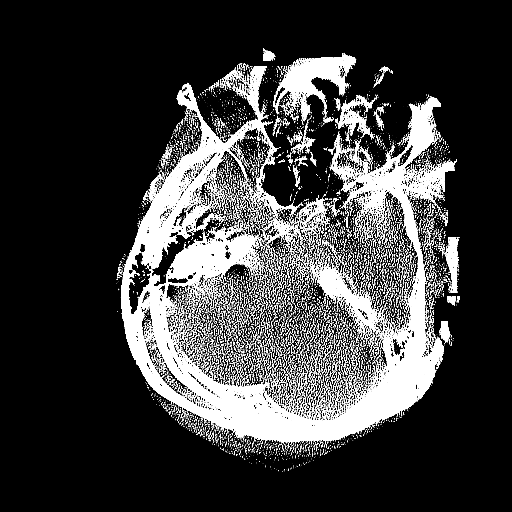
[im 25/83  brain]
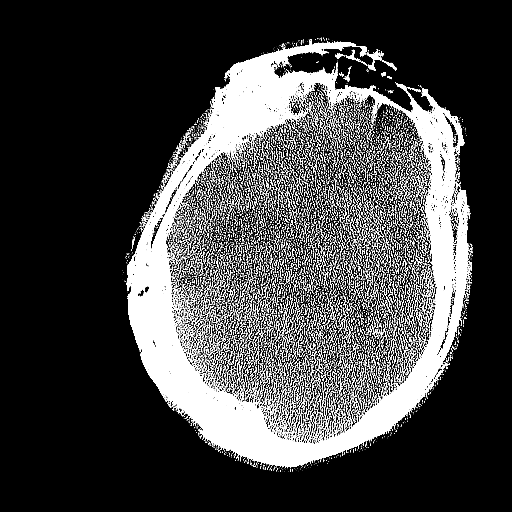
[im 33/83  brain]
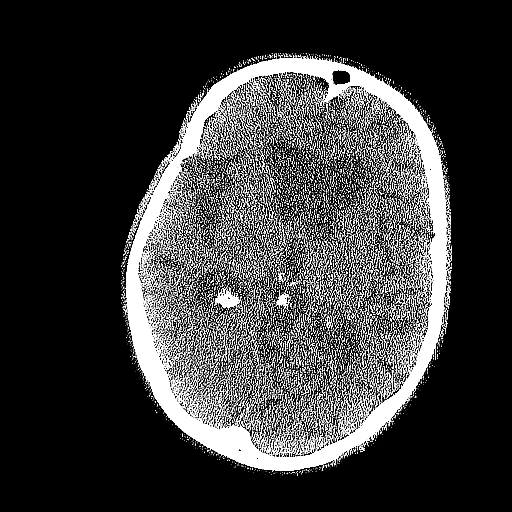
[im 42/83  brain]
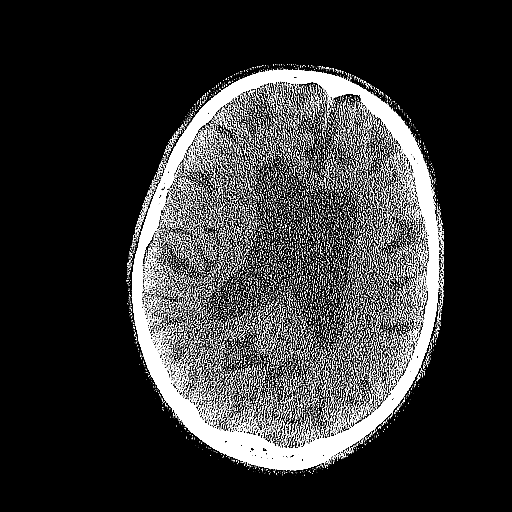
[im 42/83  bone]
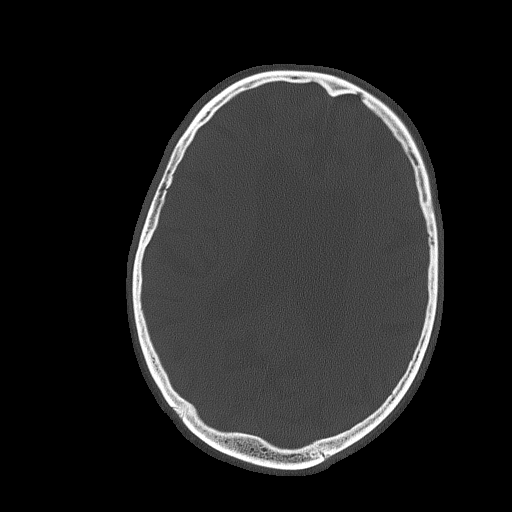
[im 50/83  brain]
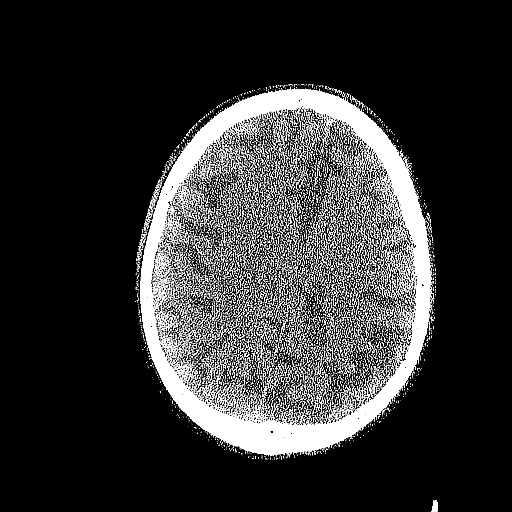
[im 58/83  brain]
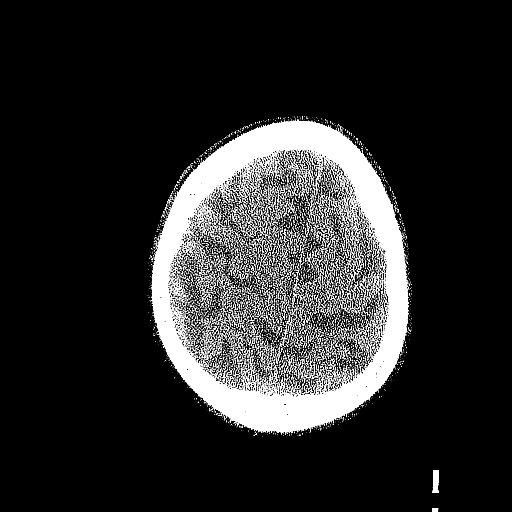
[im 66/83  brain]
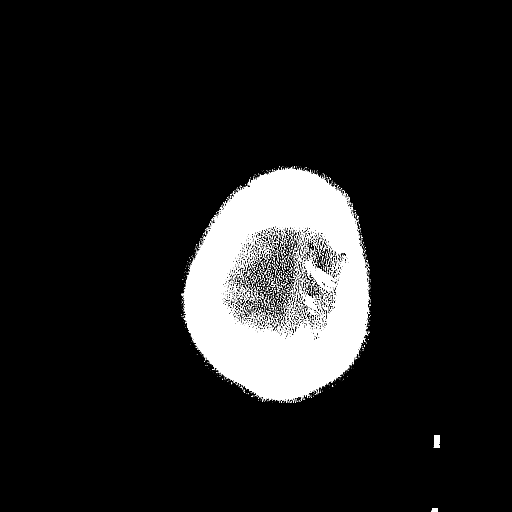
[im 74/83  brain]
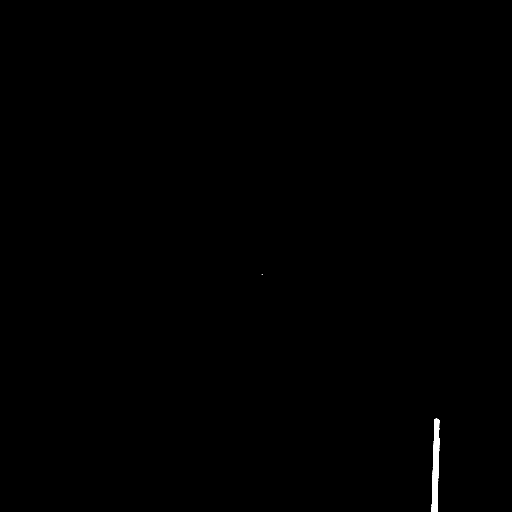
[im 74/83  bone]
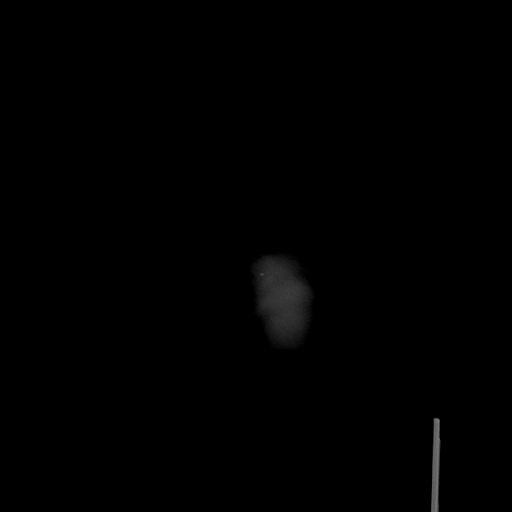

[Series 8: head 3.0 mpr sag · sagittal · 0.18mm/px · 1 of 67 slices shown]
[im 34/67  brain]
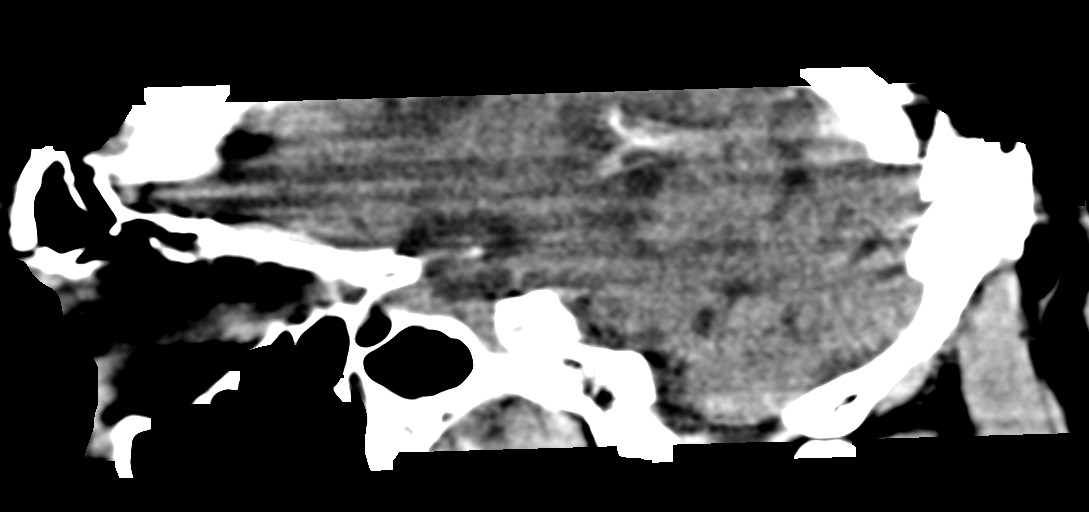

[Series 13: head 3.0 mpr cor · coronal · 0.26mm/px · 3 of 69 slices shown]
[im 20/69  brain]
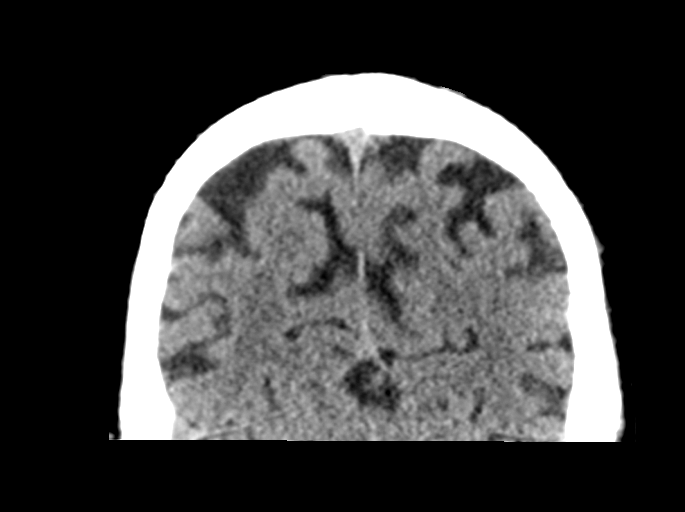
[im 39/69  brain]
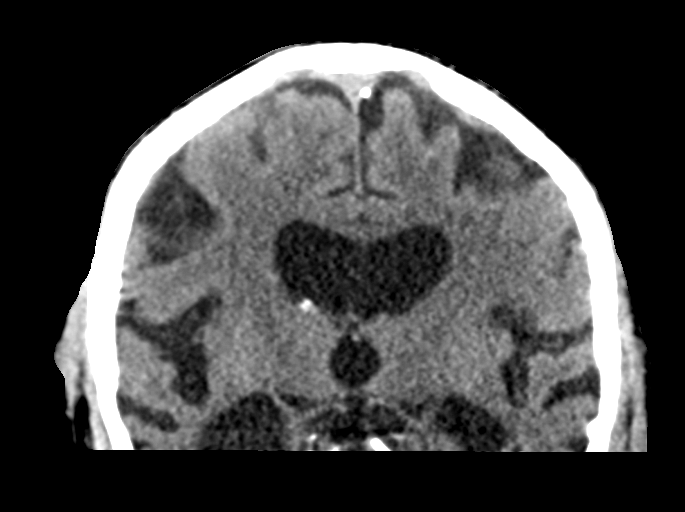
[im 58/69  brain]
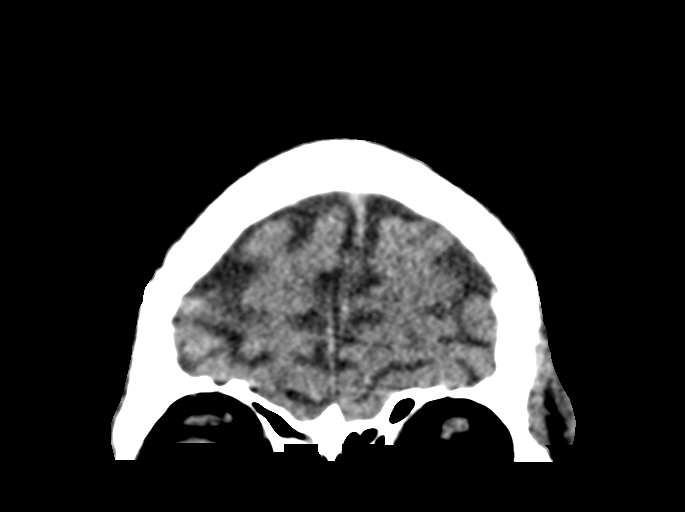

[13 of 47 positions shown; findings below may reference images not displayed]

FINDINGS: CT HEAD FINDINGS

The study is motion degraded despite repeated imaging attempts.

Brain: There is no evidence of an acute infarct, intracranial
hemorrhage, mass, midline shift, or extra-axial fluid collection.
Cerebral atrophy is unchanged with note made of severe bilateral
mesial temporal lobe atrophy. Hypodensities in the cerebral white
matter bilaterally are unchanged and nonspecific but compatible with
mild chronic small vessel ischemic disease.

Vascular: Calcified atherosclerosis at the skull base. No hyperdense
vessel.

Skull: No acute fracture or suspicious osseous lesion.

Sinuses/Orbits: Old right medial orbital fracture. Moderate mucosal
thickening in the right frontal and right ethmoid sinuses. Grossly
clear mastoid air cells.

Other: Small left periorbital hematoma.

CT CERVICAL SPINE FINDINGS

The study is mildly motion degraded despite repeat imaging.

Alignment: Cervical spine straightening. Unchanged trace
retrolisthesis of C4 on C5.

Skull base and vertebrae: No acute fracture or suspicious osseous
lesion.

Soft tissues and spinal canal: No prevertebral fluid or swelling. No
visible canal hematoma.

Disc levels: Moderate median C1-2 arthropathy. Similar appearance of
moderate to severe disc space narrowing throughout the cervical
spine with sparing of C2-3. Moderate multilevel neural foraminal
stenosis due to uncovertebral spurring.

Upper chest: Emphysema in the lung apices.

Other: Mild calcific atherosclerosis at the carotid bifurcations.
IMPRESSION: 1. No evidence of acute intracranial abnormality.
2. Small left periorbital hematoma.
3. No evidence of acute fracture or traumatic subluxation in the
cervical spine.
4. Advanced cervical disc degeneration.
5. Emphysema ([id number]).

## 2022-09-30 ENCOUNTER — Emergency Department (HOSPITAL_COMMUNITY): Payer: Medicare Other

## 2022-09-30 ENCOUNTER — Observation Stay (HOSPITAL_COMMUNITY)
Admission: EM | Admit: 2022-09-30 | Discharge: 2022-10-01 | Disposition: A | Discharge status: Skilled Nursing Facility | Payer: Medicare Other | Attending: Family Medicine | Admitting: Family Medicine

## 2022-09-30 DIAGNOSIS — E876 Hypokalemia: Secondary | ICD-10-CM | POA: Insufficient documentation

## 2022-09-30 DIAGNOSIS — F0390 Unspecified dementia without behavioral disturbance: Secondary | ICD-10-CM | POA: Diagnosis not present

## 2022-09-30 DIAGNOSIS — E870 Hyperosmolality and hypernatremia: Secondary | ICD-10-CM

## 2022-09-30 DIAGNOSIS — Z8546 Personal history of malignant neoplasm of prostate: Secondary | ICD-10-CM | POA: Diagnosis not present

## 2022-09-30 DIAGNOSIS — Z79899 Other long term (current) drug therapy: Secondary | ICD-10-CM | POA: Insufficient documentation

## 2022-09-30 DIAGNOSIS — Z87891 Personal history of nicotine dependence: Secondary | ICD-10-CM | POA: Insufficient documentation

## 2022-09-30 DIAGNOSIS — I10 Essential (primary) hypertension: Secondary | ICD-10-CM | POA: Diagnosis not present

## 2022-09-30 DIAGNOSIS — Z01812 Encounter for preprocedural laboratory examination: Secondary | ICD-10-CM | POA: Diagnosis not present

## 2022-09-30 DIAGNOSIS — G9341 Metabolic encephalopathy: Secondary | ICD-10-CM | POA: Diagnosis not present

## 2022-09-30 DIAGNOSIS — E860 Dehydration: Secondary | ICD-10-CM | POA: Diagnosis not present

## 2022-09-30 DIAGNOSIS — R4182 Altered mental status, unspecified: Secondary | ICD-10-CM | POA: Diagnosis present

## 2022-09-30 DIAGNOSIS — G9340 Encephalopathy, unspecified: Secondary | ICD-10-CM | POA: Diagnosis present

## 2022-09-30 DIAGNOSIS — E873 Alkalosis: Secondary | ICD-10-CM

## 2022-09-30 DIAGNOSIS — R400 Somnolence: Principal | ICD-10-CM

## 2022-09-30 LAB — URINALYSIS, W/ REFLEX TO CULTURE (INFECTION SUSPECTED)
Bilirubin Urine: NEGATIVE
Glucose, UA: 50 mg/dL — AB
Hgb urine dipstick: NEGATIVE
Ketones, ur: NEGATIVE mg/dL
Leukocytes,Ua: NEGATIVE
Nitrite: NEGATIVE
Protein, ur: NEGATIVE mg/dL
Specific Gravity, Urine: 1.023 (ref 1.005–1.030)
pH: 5 (ref 5.0–8.0)

## 2022-09-30 LAB — I-STAT VENOUS BLOOD GAS, ED
Acid-Base Excess: 3 mmol/L — ABNORMAL HIGH (ref 0.0–2.0)
Bicarbonate: 22.5 mmol/L (ref 20.0–28.0)
Calcium, Ion: 1.01 mmol/L — ABNORMAL LOW (ref 1.15–1.40)
HCT: 35 % — ABNORMAL LOW (ref 39.0–52.0)
Hemoglobin: 11.9 g/dL — ABNORMAL LOW (ref 13.0–17.0)
O2 Saturation: 99 %
Potassium: 2.8 mmol/L — ABNORMAL LOW (ref 3.5–5.1)
Sodium: 148 mmol/L — ABNORMAL HIGH (ref 135–145)
TCO2: 23 mmol/L (ref 22–32)
pCO2, Ven: 20.4 mmHg — ABNORMAL LOW (ref 44–60)
pH, Ven: 7.651 (ref 7.25–7.43)
pO2, Ven: 90 mmHg — ABNORMAL HIGH (ref 32–45)

## 2022-09-30 LAB — COMPREHENSIVE METABOLIC PANEL
ALT: 54 U/L — ABNORMAL HIGH (ref 0–44)
AST: 41 U/L (ref 15–41)
Albumin: 2.3 g/dL — ABNORMAL LOW (ref 3.5–5.0)
Alkaline Phosphatase: 107 U/L (ref 38–126)
Anion gap: 10 (ref 5–15)
BUN: 22 mg/dL (ref 8–23)
CO2: 25 mmol/L (ref 22–32)
Calcium: 9.2 mg/dL (ref 8.9–10.3)
Chloride: 113 mmol/L — ABNORMAL HIGH (ref 98–111)
Creatinine, Ser: 0.99 mg/dL (ref 0.61–1.24)
GFR, Estimated: >60 mL/min (ref >60)
Glucose, Bld: 79 mg/dL (ref 70–99)
Potassium: 2.8 mmol/L — ABNORMAL LOW (ref 3.5–5.1)
Sodium: 148 mmol/L — ABNORMAL HIGH (ref 135–145)
Total Bilirubin: 0.7 mg/dL (ref 0.3–1.2)
Total Protein: 6.6 g/dL (ref 6.5–8.1)

## 2022-09-30 LAB — CBG MONITORING, ED
Glucose-Capillary: 59 mg/dL — ABNORMAL LOW (ref 70–99)
Glucose-Capillary: 95 mg/dL (ref 70–99)
Glucose-Capillary: 94 mg/dL (ref 70–99)

## 2022-09-30 LAB — CBC
HCT: 43 % (ref 39.0–52.0)
Hemoglobin: 13.5 g/dL (ref 13.0–17.0)
MCH: 31.3 pg (ref 26.0–34.0)
MCHC: 31.4 g/dL (ref 30.0–36.0)
MCV: 99.5 fL (ref 80.0–100.0)
Platelets: 130 10*3/uL — ABNORMAL LOW (ref 150–400)
RBC: 4.32 MIL/uL (ref 4.22–5.81)
RDW: 15.4 % (ref 11.5–15.5)
WBC: 6.8 10*3/uL (ref 4.0–10.5)
nRBC: 0 % (ref 0.0–0.2)

## 2022-09-30 MED ORDER — DEXTROSE 50 % IV SOLN: 25.0000 g | Freq: Once | INTRAVENOUS | Status: AC

## 2022-09-30 MED ORDER — LACTATED RINGERS IV BOLUS
1000.0000 mL | Freq: Once | INTRAVENOUS | Status: AC
  Administered 2022-09-30: 1000 mL via INTRAVENOUS

## 2022-09-30 MED ORDER — SODIUM CHLORIDE 0.9% FLUSH
3.0000 mL | Freq: Two times a day (BID) | INTRAVENOUS | Status: DC
  Administered 2022-10-01: 3 mL via INTRAVENOUS

## 2022-09-30 MED ORDER — ENOXAPARIN SODIUM 40 MG/0.4ML IJ SOSY
40.0000 mg | PREFILLED_SYRINGE | Freq: Every day | INTRAMUSCULAR | Status: DC
  Administered 2022-10-01: 40 mg via SUBCUTANEOUS
  Filled 2022-09-30: qty 0.4

## 2022-09-30 MED ORDER — POTASSIUM CHLORIDE 10 MEQ/100ML IV SOLN
10.0000 meq | INTRAVENOUS | Status: AC
  Administered 2022-09-30 (×3): 10 meq via INTRAVENOUS
  Filled 2022-09-30 (×3): qty 100

## 2022-09-30 MED ORDER — SODIUM CHLORIDE 0.9 % IV SOLN: INTRAVENOUS | Status: DC

## 2022-09-30 MED ORDER — INSULIN ASPART 100 UNIT/ML IJ SOLN: 0.0000 [IU] | INTRAMUSCULAR | Status: DC

## 2022-09-30 MED ORDER — HALOPERIDOL 1 MG PO TABS: 1.0000 mg | ORAL_TABLET | Freq: Two times a day (BID) | ORAL | Status: DC | PRN

## 2022-09-30 MED ORDER — DEXTROSE 50 % IV SOLN
INTRAVENOUS | Status: AC
  Administered 2022-09-30: 25 g via INTRAVENOUS
  Filled 2022-09-30: qty 50

## 2022-09-30 MED ORDER — ACETAMINOPHEN 325 MG PO TABS: 650.0000 mg | ORAL_TABLET | Freq: Four times a day (QID) | ORAL | Status: DC | PRN

## 2022-09-30 MED ORDER — TAMSULOSIN HCL 0.4 MG PO CAPS: 0.4000 mg | ORAL_CAPSULE | Freq: Every day | ORAL | Status: DC

## 2022-09-30 MED ORDER — DEXTROSE 5 % IV SOLN: INTRAVENOUS | Status: DC

## 2022-09-30 MED ORDER — ACETAMINOPHEN 650 MG RE SUPP: 650.0000 mg | Freq: Four times a day (QID) | RECTAL | Status: DC | PRN

## 2022-09-30 MED ORDER — THIAMINE HCL 100 MG/ML IJ SOLN
100.0000 mg | Freq: Every day | INTRAMUSCULAR | Status: DC
  Administered 2022-10-01: 100 mg via INTRAVENOUS
  Filled 2022-09-30: qty 2

## 2022-09-30 MED ORDER — POLYETHYLENE GLYCOL 3350 17 G PO PACK: 17.0000 g | PACK | Freq: Every day | ORAL | Status: DC | PRN

## 2022-09-30 NOTE — Assessment & Plan Note (Signed)
Is incidentally noted on VBG.  Patient's bicarb is normal.  Patient's chest x-ray is normal.  However it is noted that patient had a single SpO2 documented of 89% at approximately 7 PM.  Since then patient has been documented between 90 and 100% on room air.  I suspect this represents likely hyperventilation as patient was having his venous gas drawn.  However out of an abundance of caution I would maintain the patient on continuous pulse oximetry in case we are missing any hypoxemia and continue to monitor clinically.

## 2022-09-30 NOTE — Plan of Care (Cosign Needed)
I had a long discussion with the daughter, [name], patient's power of attorney, about his CODE STATUS.  She relayed to me that his wishes were to be a DNR.  This is consistent with the portable DNR that he arrived with from his long-term care facility.    She would like to be contacted prior to any invasive procedures including intubation.  However, in the event that she was unable to be reached she would like full escalation of care up to and including intubation.

## 2022-09-30 NOTE — ED Notes (Signed)
Limited assessment d/t pt is nonverbal- pt daughter is at bedside

## 2022-09-30 NOTE — Assessment & Plan Note (Signed)
Likely represents dehydration from patient not eating or drinking over the last several days.  However I will check a urine sodium and trend the basic metabolic panel again at 11 PM.  Patient has received LR fluid bolus in the ER.

## 2022-09-30 NOTE — ED Notes (Signed)
Patient transported to CT 

## 2022-09-30 NOTE — ED Provider Notes (Signed)
[facility] AT [facility] Provider Note  MDM   HPI/ROS:  Ernest Turner is a 82 y.o. male with a medical history as below who presents for evaluation of ostomy status.  He is accompanied by his daughter who provides all of his history.  He has a history of dementia and he is nonverbal at baseline.  She reports that when she saw him last Sunday, he was very sleepy and the staff at his nursing home reported that there was a "cold going around but he was on antibiotics."  Today she got a call from the nursing home who said that he was experiencing some low blood pressures and had a blood glucose that was 200.  They called EMS and sent him here for evaluation.  Reportedly he is not at his baseline though he is unable to contribute to the exam.  Physical exam is notable for: - Bilateral rhonchorous lung sounds - Somnolence  On my initial evaluation, patient is:  -Vital signs stable, mild hypotension without tachycardia.  Afebrile.  Nontoxic-appearing.  -Additional history obtained from daughter  This patient's current presentation, including their history and physical exam, is most consistent with altered mental status from unknown etiology. Differentials include toxic/metabolic, sepsis from an unknown source.  Low suspicion for CVA or meningitis and history and physical exam do not support these diagnoses.  Will plan for broad altered mental status workup including labs, imaging, serial reevaluation.  Interpretations, interventions, and the patient's course of care are documented below.    Workup was largely unrevealing, no clear source for his encephalopathy.  Appears to have a contraction alkalosis, will give a fluid bolus.  Disposition:  I discussed the case with hospitalis who graciously agreed to admit the patient to their service for continued care.   Clinical Impression: No diagnosis found.  Rx / DC Orders ED Discharge Orders     None        The plan for this patient was discussed with Dr. Jearld Fenton, who voiced agreement and who oversaw evaluation and treatment of this patient.   Clinical Complexity A medically appropriate history, review of systems, and physical exam was performed.  My independent interpretations of EKG, labs, and radiology are documented in the ED course above.   If decision rules were used in this patient's evaluation, they are listed below.   Click here for ABCD2, HEART and other calculatorsREFRESH Note before signing   Patient's presentation is most consistent with acute presentation with potential threat to life or bodily function.  Medical Decision Making Amount and/or Complexity of Data Reviewed Independent Historian: caregiver External Data Reviewed: notes.    Details: Prior to discharge Labs: ordered. Radiology: ordered.  Risk Prescription drug management. Decision regarding hospitalization.    HPI/ROS      See MDM section for pertinent HPI and ROS. A complete ROS was performed with pertinent positives/negatives noted above.   Past Medical History:  Diagnosis Date   Arthritis    BPH (benign prostatic hyperplasia)    Depression    ED (erectile dysfunction)    Full dentures    Heart murmur    Hemorrhoids    History of peptic ulcer    History of radiation therapy to prostate 7800 cGy 40 sessions 04-11-2012 to 06-11-2012   EXTERNAL BEAM PELVIC AREA FOR PROSTATE CANCER   History of urinary retention    HOH (hard of hearing)    Hyperlipidemia    Hypertension    Hypogonadism male  Lower urinary tract symptoms (LUTS)    Memory difficulty    Memory loss    Poor historian    Prostate cancer [facility]) dx 12/26/11  urologist-  dr Patsi Sears  oncologist-  dr Dayton Scrape   GOLD SEED IMPLANT  03-07-2012--- Adenocarcinoma,gleason=3+3=6.,& 3+4=7,PSA=15.84--  s/p  external beam radiation 04-11-2012 to 06-11-2012   Wears glasses     Past Surgical History:  Procedure Laterality Date    CIRCUMCISION  09-21-2006   COLONOSCOPY     CYSTOSCOPY Right 10/16/2014   Procedure: CYSTO, RIGHT RETROGRADE COOK BALLOON DILATION RIGHT PROXIMAL URETHERAL STRICTURE;  Surgeon: Jethro Bolus, MD;  Location: [facility];  Service: Urology;  Laterality: Right;   INGUINAL HERNIA REPAIR Left 04/29/2014   Procedure: LEFT INGUINAL HERNIA REPAIR;  Surgeon: Abigail Miyamoto, MD;  Location: [facility] OR;  Service: General;  Laterality: Left;   INSERTION OF MESH Left 04/29/2014   Procedure: INSERTION OF MESH;  Surgeon: Abigail Miyamoto, MD;  Location: [facility] OR;  Service: General;  Laterality: Left;   MOUTH SURGERY     gum surgery   MULTIPLE TOOTH EXTRACTIONS     PROSTATE BIOPSY  12/26/11   PSA=15.84,gleason=3+3=6,& 3+4=7,Volume=27.98cc,Adenocarcinoma      Physical Exam   Vitals:   09/30/22 1726  BP: (!) 100/59  Pulse: 72  Resp: 16  Temp: 97.8 F (36.6 C)  SpO2: 97%    Physical Exam Vitals and nursing note reviewed.  Constitutional:      General: He is sleeping. He is not in acute distress.    Appearance: He is underweight. He is not ill-appearing, toxic-appearing or diaphoretic.  HENT:     Head: Normocephalic and atraumatic.     Mouth/Throat:     Mouth: Mucous membranes are moist.  Eyes:     Extraocular Movements: Extraocular movements intact.     Pupils: Pupils are equal, round, and reactive to light.  Cardiovascular:     Rate and Rhythm: Normal rate and regular rhythm.  Pulmonary:     Effort: Pulmonary effort is normal. No respiratory distress.     Breath sounds: Rhonchi (bilateral) present.  Abdominal:     General: Abdomen is flat.     Palpations: Abdomen is soft.  Skin:    General: Skin is warm and dry.     Coloration: Skin is not pale.  Neurological:     Mental Status: He is easily aroused.     GCS: GCS eye subscore is 3. GCS verbal subscore is 1. GCS motor subscore is 5.      Procedures   If procedures were preformed on this patient, they are listed below:   Procedures   Fayrene Helper, MD Emergency Medicine PGY-2   Please note that this documentation was produced with the assistance of voice-to-text technology and may contain errors.    Fayrene Helper, MD 10/01/22 0272    Loetta Rough, MD 10/02/22 [phone]

## 2022-09-30 NOTE — ED Triage Notes (Signed)
Pt to ED via [facility] from [facility] C/O AMS. Pt apparently is not as "alert" as normal. Hx dementia, non verbal. No medications given by EMS.   Last VS: 104/56, hr 75, 98%RA , CBG 196

## 2022-09-30 NOTE — ED Notes (Signed)
RN and paramedic stuck pt a total of 5 times. Unable to obtain blood. Phlebotomy called.

## 2022-09-30 NOTE — Assessment & Plan Note (Addendum)
Testing as progressive somnolence for the last 5 days.  I discussed patient's situation with the daughter over the phone.  See physical exam above.  Daughter describes this as pretty much patient's neurologic baseline.  Therefore at this time I think the acute encephalopathy has resolved.  As it was likely the result of metabolic abnormalities for dehydration, manifesting as hyponatremia as well as hypokalemia.  To make sure were not missing anything, I will check RPR TSH B12.  Patient's CAT scan, has motion artifact, not actionable.  I do not think patient would benefit from MRI in the situation.  I will request swallow screening prior to refeeding patinet. Will give empiric thiamine.

## 2022-09-30 NOTE — ED Notes (Signed)
ED TO INPATIENT HANDOFF REPORT  ED Nurse Name and Phone #: [phone]  S Name/Age/Gender Ernest Turner 82 y.o. male Room/Bed: 015C/015C  Code Status   Code Status: DNR  Home/SNF/Other Skilled nursing facility Patient oriented to: disoriented x 4 Is this baseline? Yes   Triage Complete: Triage complete  Chief Complaint Dehydration [E86.0]  Triage Note Pt to ED via [facility] from [facility] C/O AMS. Pt apparently is not as "alert" as normal. Hx dementia, non verbal. No medications given by EMS.   Last VS: 104/56, hr 75, 98%RA , CBG 196   Allergies Allergies  Allergen Reactions   Nutritional Supplements Other (See Comments)    Prostate supplement - unknown reaction   Androgel [Testosterone] Other (See Comments)    Unknown reaction   Viagra [Sildenafil Citrate] Other (See Comments)    Unknown reaction    Level of Care/Admitting Diagnosis ED Disposition     ED Disposition  Admit   Condition  --   Comment  Hospital Area: [facility] [[id number]]  Level of Care: Med-Surg [16]  May admit patient to [facility] or [facility] if equivalent level of care is available:: No  Covid Evaluation: Asymptomatic - no recent exposure (last 10 days) testing not required  Diagnosis: Dehydration [276.51.ICD-9-CM]  Admitting Physician: Nolberto Hanlon [[id number]]  Attending Physician: Nolberto Hanlon [[id number]]  Certification:: I certify this patient will need inpatient services for at least 2 midnights  Estimated Length of Stay: 3          B Medical/Surgery History Past Medical History:  Diagnosis Date   Arthritis    BPH (benign prostatic hyperplasia)    Depression    ED (erectile dysfunction)    Full dentures    Heart murmur    Hemorrhoids    History of peptic ulcer    History of radiation therapy to prostate 7800 cGy 40 sessions 04-11-2012 to 06-11-2012   EXTERNAL BEAM PELVIC AREA FOR PROSTATE CANCER   History of urinary retention    HOH (hard of hearing)     Hyperlipidemia    Hypertension    Hypogonadism male    Lower urinary tract symptoms (LUTS)    Memory difficulty    Memory loss    Poor historian    Prostate cancer ([facility]) dx 12/26/11  urologist-  dr Patsi Sears  oncologist-  dr Dayton Scrape   GOLD SEED IMPLANT  03-07-2012--- Adenocarcinoma,gleason=3+3=6.,& 3+4=7,PSA=15.84--  s/p  external beam radiation 04-11-2012 to 06-11-2012   Wears glasses    Past Surgical History:  Procedure Laterality Date   CIRCUMCISION  09-21-2006   COLONOSCOPY     CYSTOSCOPY Right 10/16/2014   Procedure: CYSTO, RIGHT RETROGRADE COOK BALLOON DILATION RIGHT PROXIMAL URETHERAL STRICTURE;  Surgeon: Jethro Bolus, MD;  Location: [facility];  Service: Urology;  Laterality: Right;   INGUINAL HERNIA REPAIR Left 04/29/2014   Procedure: LEFT INGUINAL HERNIA REPAIR;  Surgeon: Abigail Miyamoto, MD;  Location: [facility];  Service: General;  Laterality: Left;   INSERTION OF MESH Left 04/29/2014   Procedure: INSERTION OF MESH;  Surgeon: Abigail Miyamoto, MD;  Location: [facility];  Service: General;  Laterality: Left;   MOUTH SURGERY     gum surgery   MULTIPLE TOOTH EXTRACTIONS     PROSTATE BIOPSY  12/26/11   PSA=15.84,gleason=3+3=6,& 3+4=7,Volume=27.98cc,Adenocarcinoma     A IV Location/Drains/Wounds Patient Lines/Drains/Airways Status     Active Line/Drains/Airways     Name Placement date Placement time Site Days   Peripheral IV 09/30/22 20  G Left Forearm 09/30/22  1922  Forearm  less than 1   Incision (Closed) 04/29/14 Groin Left 04/29/14  0840  -- 3076            Intake/Output Last 24 hours No intake or output data in the 24 hours ending 09/30/22 2239  Labs/Imaging Results for orders placed or performed during the hospital encounter of 09/30/22 (from the past 48 hour(s))  CBC     Status: Abnormal   Collection Time: 09/30/22  6:26 PM  Result Value Ref Range   WBC 6.8 4.0 - 10.5 K/uL   RBC 4.32 4.22 - 5.81 MIL/uL   Hemoglobin 13.5 13.0 - 17.0 g/dL    HCT 16.1 09.6 - 04.5 %   MCV 99.5 80.0 - 100.0 fL   MCH 31.3 26.0 - 34.0 pg   MCHC 31.4 30.0 - 36.0 g/dL   RDW 40.9 81.1 - 91.4 %   Platelets 130 (L) 150 - 400 K/uL    Comment: REPEATED TO VERIFY PLATELET COUNT CONFIRMED BY SMEAR    nRBC 0.0 0.0 - 0.2 %    Comment: Performed at [facility], [address] [place], [place] [address]  Comprehensive metabolic panel     Status: Abnormal   Collection Time: 09/30/22  6:26 PM  Result Value Ref Range   Sodium 148 (H) 135 - 145 mmol/L   Potassium 2.8 (L) 3.5 - 5.1 mmol/L   Chloride 113 (H) 98 - 111 mmol/L   CO2 25 22 - 32 mmol/L   Glucose, Bld 79 70 - 99 mg/dL    Comment: Glucose reference range applies only to samples taken after fasting for at least 8 hours.   BUN 22 8 - 23 mg/dL   Creatinine, Ser 6.21 0.61 - 1.24 mg/dL   Calcium 9.2 8.9 - 30.8 mg/dL   Total Protein 6.6 6.5 - 8.1 g/dL   Albumin 2.3 (L) 3.5 - 5.0 g/dL   AST 41 15 - 41 U/L   ALT 54 (H) 0 - 44 U/L   Alkaline Phosphatase 107 38 - 126 U/L   Total Bilirubin 0.7 0.3 - 1.2 mg/dL   GFR, Estimated >65 >78 mL/min    Comment: (NOTE) Calculated using the CKD-EPI Creatinine Equation (2021)    Anion gap 10 5 - 15    Comment: Performed at [facility], [address], [place], [place] [address]  I-Stat venous blood gas, [facility], [facility], [facility])     Status: Abnormal   Collection Time: 09/30/22  6:33 PM  Result Value Ref Range   pH, Ven 7.651 (HH) 7.25 - 7.43   pCO2, Ven 20.4 (L) 44 - 60 mmHg   pO2, Ven 90 (H) 32 - 45 mmHg   Bicarbonate 22.5 20.0 - 28.0 mmol/L   TCO2 23 22 - 32 mmol/L   O2 Saturation 99 %   Acid-Base Excess 3.0 (H) 0.0 - 2.0 mmol/L   Sodium 148 (H) 135 - 145 mmol/L   Potassium 2.8 (L) 3.5 - 5.1 mmol/L   Calcium, Ion 1.01 (L) 1.15 - 1.40 mmol/L   HCT 35.0 (L) 39.0 - 52.0 %   Hemoglobin 11.9 (L) 13.0 - 17.0 g/dL   Sample type VENOUS    Comment NOTIFIED PHYSICIAN   POC CBG, ED     Status: Abnormal   Collection Time: 09/30/22  7:16 PM  Result Value  Ref Range   Glucose-Capillary 59 (L) 70 - 99 mg/dL    Comment: Glucose reference range  applies only to samples taken after fasting for at least 8 hours.  CBG monitoring, ED     Status: None   Collection Time: 09/30/22  8:11 PM  Result Value Ref Range   Glucose-Capillary 95 70 - 99 mg/dL    Comment: Glucose reference range applies only to samples taken after fasting for at least 8 hours.  Urinalysis, w/ Reflex to Culture (Infection Suspected) -Urine, Clean Catch     Status: Abnormal   Collection Time: 09/30/22  8:21 PM  Result Value Ref Range   Specimen Source URINE, CLEAN CATCH    Color, Urine YELLOW YELLOW   APPearance CLEAR CLEAR   Specific Gravity, Urine 1.023 1.005 - 1.030   pH 5.0 5.0 - 8.0   Glucose, UA 50 (A) NEGATIVE mg/dL   Hgb urine dipstick NEGATIVE NEGATIVE   Bilirubin Urine NEGATIVE NEGATIVE   Ketones, ur NEGATIVE NEGATIVE mg/dL   Protein, ur NEGATIVE NEGATIVE mg/dL   Nitrite NEGATIVE NEGATIVE   Leukocytes,Ua NEGATIVE NEGATIVE   RBC / HPF 0-5 0 - 5 RBC/hpf   WBC, UA 0-5 0 - 5 WBC/hpf    Comment:        Reflex urine culture not performed if WBC <=10, OR if Squamous epithelial cells >5. If Squamous epithelial cells >5 suggest recollection.    Bacteria, UA RARE (A) NONE SEEN   Squamous Epithelial / HPF 0-5 0 - 5 /HPF   Mucus PRESENT     Comment: Performed at [facility], [address] [place], [place] [address]  CBG monitoring, ED     Status: None   Collection Time: 09/30/22 10:09 PM  Result Value Ref Range   Glucose-Capillary 94 70 - 99 mg/dL    Comment: Glucose reference range applies only to samples taken after fasting for at least 8 hours.   CT Head Wo Contrast  Result Date: 09/30/2022 CLINICAL DATA:  Mental status change of unknown cause. Images were repeated due to motion. EXAM: CT HEAD WITHOUT CONTRAST TECHNIQUE: Contiguous axial images were obtained from the base of the skull through the vertex without intravenous contrast. RADIATION DOSE  REDUCTION: This exam was performed according to the departmental dose-optimization program which includes automated exposure control, adjustment of the mA and/or kV according to patient size and/or use of iterative reconstruction technique. COMPARISON:  05/08/2020 FINDINGS: Brain: Motion artifact limits examination despite repeat imaging. Diffuse cerebral atrophy. Ventricular dilatation consistent with central atrophy. Low-attenuation changes in the deep white matter consistent with small vessel ischemia. No abnormal extra-axial fluid collections. No mass effect or midline shift. Gray-white matter junctions are distinct. Basal cisterns are not effaced. No acute intracranial hemorrhage. Vascular: No hyperdense vessel or unexpected calcification. Skull: Calvarium appears intact. Sinuses/Orbits: Old appearing deformity of the medial right orbital wall, likely old injury or congenital change. No change since previous study. Paranasal sinuses are grossly clear. Left mastoid effusions. Other: None. IMPRESSION: Motion artifact limits examination. No acute intracranial abnormalities are identified. Prominent chronic atrophy and small vessel ischemic changes. Electronically Signed   By: Burman Nieves M.D.   On: 09/30/2022 19:59   DG Chest Portable 1 View  Result Date: 09/30/2022 CLINICAL DATA:  Altered mental status. EXAM: PORTABLE CHEST 1 VIEW COMPARISON:  December 17, 2019 FINDINGS: The heart size and mediastinal contours are within normal limits. Both lungs are clear. Chronic elevation of left hemidiaphragm. The visualized skeletal structures are unremarkable. IMPRESSION: No active disease. Electronically Signed   By: Ted Mcalpine M.D.   On: 09/30/2022  19:15    Pending Labs Unresulted Labs (From admission, onward)     Start     Ordered   10/07/22 0500  Creatinine, serum  (enoxaparin (LOVENOX)    CrCl >/= 30 ml/min)  Weekly,   R     Comments: while on enoxaparin therapy    09/30/22 2200   10/01/22  0500  Vitamin B12  Tomorrow morning,   R        09/30/22 2128   10/01/22 0500  TSH  Tomorrow morning,   R        09/30/22 2128   10/01/22 0500  RPR  Tomorrow morning,   R        09/30/22 2128   10/01/22 0500  APTT  Tomorrow morning,   R        09/30/22 2200   10/01/22 0500  Protime-INR  Tomorrow morning,   R        09/30/22 2200   10/01/22 0500  Basic metabolic panel  Tomorrow morning,   R        09/30/22 2200   10/01/22 0500  CBC  Tomorrow morning,   R        09/30/22 2200   09/30/22 2300  Basic metabolic panel  Once-Timed,   STAT        09/30/22 2144   09/30/22 2201  Sodium, urine, random  Add-on,   AD        09/30/22 2200   09/30/22 2201  Hemoglobin A1c  Once,   R       Comments: To assess prior glycemic control    09/30/22 2200   09/30/22 2201  CBC  (enoxaparin (LOVENOX)    CrCl >/= 30 ml/min)  Once,   R       Comments: Baseline for enoxaparin therapy IF NOT ALREADY DRAWN.  Notify MD if PLT < 100 K.    09/30/22 2200   09/30/22 2200  Phosphorus  Add-on,   AD        09/30/22 2159   09/30/22 1740  Ammonia  Once,   STAT        09/30/22 1740            Vitals/Pain Today's Vitals   09/30/22 2145 09/30/22 2200 09/30/22 2215 09/30/22 2230  BP:  112/68  117/77  Pulse: 75 (!) 57 61 (!) 56  Resp: 15 15 16 14   Temp:      SpO2: 94% 96% 93% 96%    Isolation Precautions No active isolations  Medications Medications  potassium chloride 10 mEq in 100 mL IVPB (10 mEq Intravenous New Bag/Given 09/30/22 2207)  insulin aspart (novoLOG) injection 0-6 Units ( Subcutaneous Not Given 09/30/22 2210)  haloperidol (HALDOL) tablet 1 mg (has no administration in time range)  tamsulosin (FLOMAX) capsule 0.4 mg (0.4 mg Oral Not Given 09/30/22 2214)  thiamine (VITAMIN B1) injection 100 mg (has no administration in time range)  enoxaparin (LOVENOX) injection 40 mg (has no administration in time range)  acetaminophen (TYLENOL) tablet 650 mg (has no administration in time range)    Or   acetaminophen (TYLENOL) suppository 650 mg (has no administration in time range)  polyethylene glycol (MIRALAX / GLYCOLAX) packet 17 g (has no administration in time range)  sodium chloride flush (NS) 0.9 % injection 3 mL (3 mLs Intravenous Not Given 09/30/22 2210)  dextrose 5 % solution (has no administration in time range)  lactated ringers bolus 1,000 mL (1,000 mLs Intravenous New Bag/Given 09/30/22  2004)  dextrose 50 % solution 25 g (25 g Intravenous Given 09/30/22 1926)    Mobility non-ambulatory     Focused Assessments     R Recommendations: See Admitting Provider Note  Report given to:   Additional Notes: from snf / dementia and nonverbal / incontinent / hard stick, IV left FA /

## 2022-09-30 NOTE — H&P (Addendum)
History and Physical    Patient: Ernest Turner YNW:[id number] DOB: 04/26/1940 DOA: 09/30/2022 DOS: the patient was seen and examined on 09/30/2022 PCP: [facility]  Patient coming from: SNF  Chief Complaint:  Chief Complaint  Patient presents with   Altered Mental Status   HPI: Ernest Turner is a 82 y.o. male with medical history significant of dementia with a baseline status of not being verbal.  History is obtained from secondary sources.  For the last 5 days, patient has been noted to have very poor intake, progressive somnolence.  Finally, today patient was noted to have questionable low BP versus questionable elevated capillary blood glucose and patient is transferred to [facility] ER in [place].  ER course is notable for patient somnolence being confirmed, however patient's blood pressure has been stable here in the ER and there is finding of metabolic abnormalities of electrolytes.  These have been repleted, patient has been given fluids.  Medical evaluation is sought.  There is report of fever, dirhea, vomting.   At this time patient remains normal verbal.  See exam below, however making eye contact and seems to be arousable with verbal/noise stimulation. Review of Systems: unable to review all systems due to the inability of the patient to answer questions. Past Medical History:  Diagnosis Date   Arthritis    BPH (benign prostatic hyperplasia)    Depression    ED (erectile dysfunction)    Full dentures    Heart murmur    Hemorrhoids    History of peptic ulcer    History of radiation therapy to prostate 7800 cGy 40 sessions 04-11-2012 to 06-11-2012   EXTERNAL BEAM PELVIC AREA FOR PROSTATE CANCER   History of urinary retention    HOH (hard of hearing)    Hyperlipidemia    Hypertension    Hypogonadism male    Lower urinary tract symptoms (LUTS)    Memory difficulty    Memory loss    Poor historian    Prostate cancer (HCC) dx 12/26/11  urologist-  dr  Patsi Sears  oncologist-  dr Dayton Scrape   GOLD SEED IMPLANT  03-07-2012--- Adenocarcinoma,gleason=3+3=6.,& 3+4=7,PSA=15.84--  s/p  external beam radiation 04-11-2012 to 06-11-2012   Wears glasses    Past Surgical History:  Procedure Laterality Date   CIRCUMCISION  09-21-2006   COLONOSCOPY     CYSTOSCOPY Right 10/16/2014   Procedure: CYSTO, RIGHT RETROGRADE COOK BALLOON DILATION RIGHT PROXIMAL URETHERAL STRICTURE;  Surgeon: Jethro Bolus, MD;  Location: [facility];  Service: Urology;  Laterality: Right;   INGUINAL HERNIA REPAIR Left 04/29/2014   Procedure: LEFT INGUINAL HERNIA REPAIR;  Surgeon: Abigail Miyamoto, MD;  Location: [facility] OR;  Service: General;  Laterality: Left;   INSERTION OF MESH Left 04/29/2014   Procedure: INSERTION OF MESH;  Surgeon: Abigail Miyamoto, MD;  Location: [facility] OR;  Service: General;  Laterality: Left;   MOUTH SURGERY     gum surgery   MULTIPLE TOOTH EXTRACTIONS     PROSTATE BIOPSY  12/26/11   PSA=15.84,gleason=3+3=6,& 3+4=7,Volume=27.98cc,Adenocarcinoma   Social History:  reports that he quit smoking about 38 years ago. His smoking use included cigarettes. He started smoking about 58 years ago. He has a 40 pack-year smoking history. He has never used smokeless tobacco. He reports that he does not drink alcohol and does not use drugs.  Allergies  Allergen Reactions   Nutritional Supplements Other (See Comments)    Prostate supplement - unknown reaction   Androgel [Testosterone] Other (See Comments)  Unknown reaction   Viagra [Sildenafil Citrate] Other (See Comments)    Unknown reaction    Family History  Problem Relation Age of Onset   Cancer Sister        liver to brain mets died late 6's   Cancer Daughter        breast cancer died age 21   Diabetes Mother     Prior to Admission medications   Medication Sig Start Date End Date Taking? Authorizing Provider  acetaminophen (TYLENOL) 500 MG tablet Take 500 mg by mouth every 6 (six) hours as  needed (fever 99.5-101 F, minor headache, minor discomfort).    [provider]  alum & mag hydroxide-simeth (MAALOX/MYLANTA) 200-200-20 MG/5ML suspension Take 30 mLs by mouth every 6 (six) hours as needed for indigestion or heartburn.    [provider]  amLODipine (NORVASC) 10 MG tablet Take 1 tablet (10 mg total) by mouth daily. 10/24/19   Danford, Earl Lites, MD  docusate sodium (COLACE) 100 MG capsule Take 100 mg by mouth daily.    [provider]  guaifenesin (ROBITUSSIN) 100 MG/5ML syrup Take 200 mg by mouth every 6 (six) hours as needed for cough.    [provider]  haloperidol (HALDOL) 1 MG tablet Take 1 mg by mouth 2 (two) times daily.    [provider]  loperamide (IMODIUM) 2 MG capsule Take 2 mg by mouth as needed for diarrhea or loose stools (not to exceed 8 doses).    [provider]  loratadine (CLARITIN) 10 MG tablet Take 10 mg by mouth daily.    [provider]  magnesium hydroxide (MILK OF MAGNESIA) 400 MG/5ML suspension Take 30 mLs by mouth at bedtime as needed for mild constipation.    [provider]  Neomycin-Bacitracin-Polymyxin (TRIPLE ANTIBIOTIC) [phone] OINT Apply 1 application topically 2 (two) times daily as needed (minor skin tears or abrasians).    [provider]  Nutritional Supplements (NUTRITIONAL SUPPLEMENT PO) Take 118 mLs by mouth 4 (four) times daily.    [provider]  sertraline (ZOLOFT) 25 MG tablet Take 25 mg by mouth at bedtime.    [provider]  tamsulosin (FLOMAX) 0.4 MG CAPS capsule Take 1 capsule (0.4 mg total) by mouth daily after breakfast. Take tamsulosin only if having difficulty voiding. Patient taking differently: Take 0.4 mg by mouth daily as needed (difficulty voiding). 10/16/14   Jethro Bolus, MD  vitamin B-12 1000 MCG tablet Take 1 tablet (1,000 mcg total) by mouth daily. 10/24/19   Alberteen Sam, MD    Physical  Exam: Vitals:   09/30/22 2000 09/30/22 2030 09/30/22 2105 09/30/22 2115  BP: 121/72 110/84 116/87   Pulse: 72 66 67 67  Resp: 15 20 13 14   Temp:      SpO2: 96% 98% 98% 96%   General - patient seemed to be sleepy. However, was easily woken up by talking. Maeks eye contact, tracks somewhat, turns somehwat. Mouths words. However, no comprehensible verbalization.  Patient is startled/apprehensive with tactile stimulation.  Patient maintains flexor posturing of upper extremities.  Does not follow directions.  Exam seems to be nonfocal although it is very limited because patient is not following directions and is again guarding/maintaining fair flexor posturing for himself.  Distress is apparent Respiratory; bilateral intravesicular Abdomen soft, however exam obstructed by patient maintaining his both hands and forearms over the abdomen Extremities warm without edema.  No focal motor deficit is appreciated. Cardiovascular exam S1-S2 normal  Data  Reviewed:  Labs on Admission:  Results for orders placed or performed during the hospital encounter of 09/30/22 (from the past 24 hour(s))  CBC     Status: Abnormal   Collection Time: 09/30/22  6:26 PM  Result Value Ref Range   WBC 6.8 4.0 - 10.5 K/uL   RBC 4.32 4.22 - 5.81 MIL/uL   Hemoglobin 13.5 13.0 - 17.0 g/dL   HCT 16.1 09.6 - 04.5 %   MCV 99.5 80.0 - 100.0 fL   MCH 31.3 26.0 - 34.0 pg   MCHC 31.4 30.0 - 36.0 g/dL   RDW 40.9 81.1 - 91.4 %   Platelets 130 (L) 150 - 400 K/uL   nRBC 0.0 0.0 - 0.2 %  Comprehensive metabolic panel     Status: Abnormal   Collection Time: 09/30/22  6:26 PM  Result Value Ref Range   Sodium 148 (H) 135 - 145 mmol/L   Potassium 2.8 (L) 3.5 - 5.1 mmol/L   Chloride 113 (H) 98 - 111 mmol/L   CO2 25 22 - 32 mmol/L   Glucose, Bld 79 70 - 99 mg/dL   BUN 22 8 - 23 mg/dL   Creatinine, Ser 7.82 0.61 - 1.24 mg/dL   Calcium 9.2 8.9 - 95.6 mg/dL   Total Protein 6.6 6.5 - 8.1 g/dL   Albumin 2.3 (L) 3.5 - 5.0 g/dL   AST  41 15 - 41 U/L   ALT 54 (H) 0 - 44 U/L   Alkaline Phosphatase 107 38 - 126 U/L   Total Bilirubin 0.7 0.3 - 1.2 mg/dL   GFR, Estimated >21 >30 mL/min   Anion gap 10 5 - 15  I-Stat venous blood gas, ([facility], [facility], [facility])     Status: Abnormal   Collection Time: 09/30/22  6:33 PM  Result Value Ref Range   pH, Ven 7.651 (HH) 7.25 - 7.43   pCO2, Ven 20.4 (L) 44 - 60 mmHg   pO2, Ven 90 (H) 32 - 45 mmHg   Bicarbonate 22.5 20.0 - 28.0 mmol/L   TCO2 23 22 - 32 mmol/L   O2 Saturation 99 %   Acid-Base Excess 3.0 (H) 0.0 - 2.0 mmol/L   Sodium 148 (H) 135 - 145 mmol/L   Potassium 2.8 (L) 3.5 - 5.1 mmol/L   Calcium, Ion 1.01 (L) 1.15 - 1.40 mmol/L   HCT 35.0 (L) 39.0 - 52.0 %   Hemoglobin 11.9 (L) 13.0 - 17.0 g/dL   Sample type VENOUS    Comment NOTIFIED PHYSICIAN   POC CBG, ED     Status: Abnormal   Collection Time: 09/30/22  7:16 PM  Result Value Ref Range   Glucose-Capillary 59 (L) 70 - 99 mg/dL  CBG monitoring, ED     Status: None   Collection Time: 09/30/22  8:11 PM  Result Value Ref Range   Glucose-Capillary 95 70 - 99 mg/dL  Urinalysis, w/ Reflex to Culture (Infection Suspected) -Urine, Clean Catch     Status: Abnormal   Collection Time: 09/30/22  8:21 PM  Result Value Ref Range   Specimen Source URINE, CLEAN CATCH    Color, Urine YELLOW YELLOW   APPearance CLEAR CLEAR   Specific Gravity, Urine 1.023 1.005 - 1.030   pH 5.0 5.0 - 8.0   Glucose, UA 50 (A) NEGATIVE mg/dL   Hgb urine dipstick NEGATIVE NEGATIVE   Bilirubin Urine NEGATIVE NEGATIVE   Ketones, ur NEGATIVE NEGATIVE mg/dL   Protein, ur NEGATIVE NEGATIVE mg/dL   Nitrite NEGATIVE  NEGATIVE   Leukocytes,Ua NEGATIVE NEGATIVE   RBC / HPF 0-5 0 - 5 RBC/hpf   WBC, UA 0-5 0 - 5 WBC/hpf   Bacteria, UA RARE (A) NONE SEEN   Squamous Epithelial / HPF 0-5 0 - 5 /HPF   Mucus PRESENT    Basic Metabolic Panel: Recent Labs  Lab 09/30/22 1826 09/30/22 1833  NA 148* 148*  K 2.8* 2.8*  CL 113*  --   CO2 25  --   GLUCOSE 79  --    BUN 22  --   CREATININE 0.99  --   CALCIUM 9.2  --    Liver Function Tests: Recent Labs  Lab 09/30/22 1826  AST 41  ALT 54*  ALKPHOS 107  BILITOT 0.7  PROT 6.6  ALBUMIN 2.3*   No results for input(s): "LIPASE", "AMYLASE" in the last 168 hours. No results for input(s): "AMMONIA" in the last 168 hours. CBC: Recent Labs  Lab 09/30/22 1826 09/30/22 1833  WBC 6.8  --   HGB 13.5 11.9*  HCT 43.0 35.0*  MCV 99.5  --   PLT 130*  --    Cardiac Enzymes: No results for input(s): "CKTOTAL", "CKMB", "CKMBINDEX", "TROPONINIHS" in the last 168 hours.  BNP (last 3 results) No results for input(s): "PROBNP" in the last 8760 hours. CBG: Recent Labs  Lab 09/30/22 1916 09/30/22 2011  GLUCAP 59* 95    Radiological Exams on Admission:  CT Head Wo Contrast  Result Date: 09/30/2022 CLINICAL DATA:  Mental status change of unknown cause. Images were repeated due to motion. EXAM: CT HEAD WITHOUT CONTRAST TECHNIQUE: Contiguous axial images were obtained from the base of the skull through the vertex without intravenous contrast. RADIATION DOSE REDUCTION: This exam was performed according to the departmental dose-optimization program which includes automated exposure control, adjustment of the mA and/or kV according to patient size and/or use of iterative reconstruction technique. COMPARISON:  05/08/2020 FINDINGS: Brain: Motion artifact limits examination despite repeat imaging. Diffuse cerebral atrophy. Ventricular dilatation consistent with central atrophy. Low-attenuation changes in the deep white matter consistent with small vessel ischemia. No abnormal extra-axial fluid collections. No mass effect or midline shift. Gray-white matter junctions are distinct. Basal cisterns are not effaced. No acute intracranial hemorrhage. Vascular: No hyperdense vessel or unexpected calcification. Skull: Calvarium appears intact. Sinuses/Orbits: Old appearing deformity of the medial right orbital wall, likely old  injury or congenital change. No change since previous study. Paranasal sinuses are grossly clear. Left mastoid effusions. Other: None. IMPRESSION: Motion artifact limits examination. No acute intracranial abnormalities are identified. Prominent chronic atrophy and small vessel ischemic changes. Electronically Signed   By: Burman Nieves M.D.   On: 09/30/2022 19:59   DG Chest Portable 1 View  Result Date: 09/30/2022 CLINICAL DATA:  Altered mental status. EXAM: PORTABLE CHEST 1 VIEW COMPARISON:  December 17, 2019 FINDINGS: The heart size and mediastinal contours are within normal limits. Both lungs are clear. Chronic elevation of left hemidiaphragm. The visualized skeletal structures are unremarkable. IMPRESSION: No active disease. Electronically Signed   By: Ted Mcalpine M.D.   On: 09/30/2022 19:15    EKG: Independently reviewed. NSR. Artifact present.   Assessment and Plan: Hypernatremia Likely represents dehydration from patient not eating or drinking over the last several days.  However I will check a urine sodium and trend the basic metabolic panel again at 11 PM.  Patient has received LR fluid bolus in the ER.  Respiratory alkalosis Is incidentally noted on VBG.  Patient's bicarb  is normal.  Patient's chest x-ray is normal.  However it is noted that patient had a single SpO2 documented of 89% at approximately 7 PM.  Since then patient has been documented between 90 and 100% on room air.  I suspect this represents likely hyperventilation as patient was having his venous gas drawn.  However out of an abundance of caution I would maintain the patient on continuous pulse oximetry in case we are missing any hypoxemia and continue to monitor clinically.  Acute encephalopathy Testing as progressive somnolence for the last 5 days.  I discussed patient's situation with the daughter over the phone.  See physical exam above.  Daughter describes this as pretty much patient's neurologic baseline.   Therefore at this time I think the acute encephalopathy has resolved.  As it was likely the result of metabolic abnormalities for dehydration, manifesting as hyponatremia as well as hypokalemia.  To make sure were not missing anything, I will check RPR TSH B12.  Patient's CAT scan, has motion artifact, not actionable.  I do not think patient would benefit from MRI in the situation.  Since medication reconciliation at this time has been completed.  I will hold off on patient's sertraline, given his advanced dementia I doubt it would benefit patient.  I am also holding patient's amlodipine given his rather soft blood pressure here.  I am continuing patient's tamsulosin to prevent urinary retention in the hospital.  I will order a bladder scan frequently.  I have also requested a nutrition evaluation for patient    Advance Care Planning:   Code Status: DNR discussed with daughter over the phone, also mobile yellow sheet is seen at the patient's bedside.  Otherwise there are no restrictions on patient care  Consults: None at this time  Family Communication: I called patient's daughter and had discussion as above.  History was corroborated.  Unfortunately daughter has not been in continuous contact with patient over the last several days.  Therefore history was obtained largely from signout/record review.  Severity of Illness: The appropriate patient status for this patient is INPATIENT. Inpatient status is judged to be reasonable and necessary in order to provide the required intensity of service to ensure the patient's safety. The patient's presenting symptoms, physical exam findings, and initial radiographic and laboratory data in the context of their chronic comorbidities is felt to place them at high risk for further clinical deterioration. Furthermore, it is not anticipated that the patient will be medically stable for discharge from the hospital within 2 midnights of admission.   * I certify that at  the point of admission it is my clinical judgment that the patient will require inpatient hospital care spanning beyond 2 midnights from the point of admission due to high intensity of service, high risk for further deterioration and high frequency of surveillance required.*  Author: Nolberto Hanlon, MD 09/30/2022 9:28 PM  For on call review [email or web address].

## 2022-09-30 NOTE — ED Notes (Signed)
Updated daughter on pt.  

## 2022-10-01 DIAGNOSIS — E860 Dehydration: Secondary | ICD-10-CM | POA: Diagnosis not present

## 2022-10-01 DIAGNOSIS — E876 Hypokalemia: Secondary | ICD-10-CM | POA: Insufficient documentation

## 2022-10-01 LAB — RPR: RPR Ser Ql: NONREACTIVE

## 2022-10-01 LAB — PHOSPHORUS: Phosphorus: 2.5 mg/dL (ref 2.5–4.6)

## 2022-10-01 LAB — BASIC METABOLIC PANEL
Anion gap: 8 (ref 5–15)
BUN: 20 mg/dL (ref 8–23)
CO2: 26 mmol/L (ref 22–32)
Calcium: 9 mg/dL (ref 8.9–10.3)
Chloride: 111 mmol/L (ref 98–111)
Creatinine, Ser: 0.81 mg/dL (ref 0.61–1.24)
GFR, Estimated: >60 mL/min (ref >60)
Glucose, Bld: 113 mg/dL — ABNORMAL HIGH (ref 70–99)
Potassium: 2.9 mmol/L — ABNORMAL LOW (ref 3.5–5.1)
Sodium: 145 mmol/L (ref 135–145)

## 2022-10-01 LAB — CBC
HCT: 33.2 % — ABNORMAL LOW (ref 39.0–52.0)
Hemoglobin: 10.6 g/dL — ABNORMAL LOW (ref 13.0–17.0)
MCH: 30 pg (ref 26.0–34.0)
MCHC: 31.9 g/dL (ref 30.0–36.0)
MCV: 94.1 fL (ref 80.0–100.0)
Platelets: 159 10*3/uL (ref 150–400)
RBC: 3.53 MIL/uL — ABNORMAL LOW (ref 4.22–5.81)
RDW: 15.1 % (ref 11.5–15.5)
WBC: 5.8 10*3/uL (ref 4.0–10.5)
nRBC: 0 % (ref 0.0–0.2)

## 2022-10-01 LAB — GLUCOSE, CAPILLARY
Glucose-Capillary: 129 mg/dL — ABNORMAL HIGH (ref 70–99)
Glucose-Capillary: 118 mg/dL — ABNORMAL HIGH (ref 70–99)
Glucose-Capillary: 113 mg/dL — ABNORMAL HIGH (ref 70–99)
Glucose-Capillary: 99 mg/dL (ref 70–99)
Glucose-Capillary: 98 mg/dL (ref 70–99)

## 2022-10-01 LAB — HEMOGLOBIN A1C
Hgb A1c MFr Bld: 6.2 % — ABNORMAL HIGH (ref 4.8–5.6)
Mean Plasma Glucose: 131.24 mg/dL

## 2022-10-01 LAB — APTT: aPTT: 32 seconds (ref 24–36)

## 2022-10-01 LAB — POTASSIUM: Potassium: 3.9 mmol/L (ref 3.5–5.1)

## 2022-10-01 LAB — VITAMIN B12: Vitamin B-12: 3412 pg/mL — ABNORMAL HIGH (ref 180–914)

## 2022-10-01 LAB — TSH: TSH: 0.543 u[IU]/mL (ref 0.350–4.500)

## 2022-10-01 LAB — PROTIME-INR
INR: 1.2 (ref 0.8–1.2)
Prothrombin Time: 15.5 seconds — ABNORMAL HIGH (ref 11.4–15.2)

## 2022-10-01 LAB — AMMONIA: Ammonia: 41 umol/L — ABNORMAL HIGH (ref 9–35)

## 2022-10-01 LAB — TROPONIN I (HIGH SENSITIVITY): Troponin I (High Sensitivity): 20 ng/L — ABNORMAL HIGH (ref <18)

## 2022-10-01 MED ORDER — ADULT MULTIVITAMIN W/MINERALS CH
1.0000 | ORAL_TABLET | Freq: Every day | ORAL | Status: DC
  Administered 2022-10-01: 1 via ORAL
  Filled 2022-10-01: qty 1

## 2022-10-01 MED ORDER — POTASSIUM CL IN DEXTROSE 5% 20 MEQ/L IV SOLN
20.0000 meq | INTRAVENOUS | Status: DC
  Administered 2022-10-01: 20 meq via INTRAVENOUS
  Filled 2022-10-01: qty 1000

## 2022-10-01 MED ORDER — POTASSIUM CHLORIDE 20 MEQ PO PACK
40.0000 meq | PACK | Freq: Once | ORAL | Status: AC
  Administered 2022-10-01: 40 meq via ORAL
  Filled 2022-10-01: qty 2

## 2022-10-01 MED ORDER — POTASSIUM CHLORIDE 10 MEQ/100ML IV SOLN
10.0000 meq | INTRAVENOUS | Status: AC
  Administered 2022-10-01 (×4): 10 meq via INTRAVENOUS
  Filled 2022-10-01 (×4): qty 100

## 2022-10-01 MED ORDER — ENSURE ENLIVE PO LIQD: 237.0000 mL | Freq: Two times a day (BID) | ORAL | Status: DC

## 2022-10-01 NOTE — Discharge Summary (Signed)
Physician Discharge Summary   Patient: Ernest Turner MRN: [id number] DOB: 1940-07-04  Admit date:     09/30/2022  Discharge date: 10/01/22  Discharge Physician: Alberteen Sam   PCP: [facility]     Recommendations at discharge:  Recommend that Palliative Care be consulted to follow patient in outpatient setting Check BMP in 1 week to confirm K and Na remain normal Hold amlodipine at discharge, check BP weekly and resume amlodipine if appropriate     Discharge Diagnoses: Principal Problem:   Dehydration Active Problems:   Acute metabolic encephalopathy   Essential hypertension   Dementia (HCC)   Respiratory alkalosis   Hypernatremia   Hypokalemia      Hospital Course: [name] is an 82 y.o. M with dementia, FAST 6e/7 and HTN who presented from SNF for decreased responsiveness.  Some residents at facility had had "colds" this week, and so when patient was noticed in the last few days to have decreased responsiveness, he was put on antibiotics.  His sympoms worsened though, and so he was sent to the ER.  In the ER, CXR clear and UA normal.  CTH unremarkable.  Found to have Na 148 and K 2.8.  Started on fluids and admitted.      Hypernatremia Started on fluids and resolved.  Hypokalemia K supplemented and resolved.    Respiratory alkalosis Alkalosis likely due to dehydration.  Now resolved.  Low CO2 on venous gas hard to interpret.  CXR clear.  Doubt PE.  Abdominal exam benign.      Dementia Given his advancing dementia, and now admission for dehydration, would recommend palliative to evaluate and follow in the outpatient setting.  Discussed with daughter.            The [facility] Controlled Substances Registry was reviewed for this patient prior to discharge.  Consultants: None Procedures performed: CT head  Disposition: Long term care facility Diet recommendation:  Regular diet  DISCHARGE  MEDICATION: Allergies as of 10/01/2022       Reactions   Nutritional Supplements Other (See Comments)   Prostate supplement - unknown reaction   Androgel [testosterone] Other (See Comments)   Unknown reaction   Viagra [sildenafil Citrate] Other (See Comments)   Unknown reaction        Medication List     STOP taking these medications    amLODipine 10 MG tablet Commonly known as: NORVASC       TAKE these medications    cyanocobalamin 1000 MCG tablet Take 1 tablet (1,000 mcg total) by mouth daily.   haloperidol 1 MG tablet Commonly known as: HALDOL Take 1 mg by mouth 2 (two) times daily as needed for agitation.   loratadine 10 MG tablet Commonly known as: CLARITIN Take 10 mg by mouth daily.   neomycin-bacitracin-polymyxin [phone] Oint Apply 1 application topically 2 (two) times daily as needed (minor skin tears or abrasians).   NUTRITIONAL SUPPLEMENT PO Take 118 mLs by mouth 4 (four) times daily.   senna 8.6 MG Tabs tablet Commonly known as: SENOKOT Take 1 tablet by mouth daily.   sertraline 25 MG tablet Commonly known as: ZOLOFT Take 25 mg by mouth at bedtime.   tamsulosin 0.4 MG Caps capsule Commonly known as: FLOMAX Take 1 capsule (0.4 mg total) by mouth daily after breakfast. Take tamsulosin only if having difficulty voiding. What changed:  when to take this additional instructions         Discharge Instructions  Increase activity slowly   Complete by: As directed        Discharge Exam: Filed Weights   10/01/22 0500  Weight: 62.6 kg    General: Pt is awake, eating breakfast with tech, does not follow commands, mumbles unintellgibly.  Per tech, was able to state a few words earlier today.   Cardiovascular: RRR, nl S1-S2, no murmurs appreciated.   No LE edema.   Respiratory: Normal respiratory rate and rhythm.  CTAB without rales or wheezes. Abdominal: Abdomen soft and non-tender.  No distension or HSM.   Neuro/Psych: Diminished  muscle mass and fat.  Contractures in all four extremities.  Eyes open and eating, but does not follow commands.      Condition at discharge: Stable  The results of significant diagnostics from this hospitalization (including imaging, microbiology, ancillary and laboratory) are listed below for reference.   Imaging Studies: CT Head Wo Contrast  Result Date: 09/30/2022 CLINICAL DATA:  Mental status change of unknown cause. Images were repeated due to motion. EXAM: CT HEAD WITHOUT CONTRAST TECHNIQUE: Contiguous axial images were obtained from the base of the skull through the vertex without intravenous contrast. RADIATION DOSE REDUCTION: This exam was performed according to the departmental dose-optimization program which includes automated exposure control, adjustment of the mA and/or kV according to patient size and/or use of iterative reconstruction technique. COMPARISON:  05/08/2020 FINDINGS: Brain: Motion artifact limits examination despite repeat imaging. Diffuse cerebral atrophy. Ventricular dilatation consistent with central atrophy. Low-attenuation changes in the deep white matter consistent with small vessel ischemia. No abnormal extra-axial fluid collections. No mass effect or midline shift. Gray-white matter junctions are distinct. Basal cisterns are not effaced. No acute intracranial hemorrhage. Vascular: No hyperdense vessel or unexpected calcification. Skull: Calvarium appears intact. Sinuses/Orbits: Old appearing deformity of the medial right orbital wall, likely old injury or congenital change. No change since previous study. Paranasal sinuses are grossly clear. Left mastoid effusions. Other: None. IMPRESSION: Motion artifact limits examination. No acute intracranial abnormalities are identified. Prominent chronic atrophy and small vessel ischemic changes. Electronically Signed   By: Burman Nieves M.D.   On: 09/30/2022 19:59   DG Chest Portable 1 View  Result Date: 09/30/2022 CLINICAL  DATA:  Altered mental status. EXAM: PORTABLE CHEST 1 VIEW COMPARISON:  December 17, 2019 FINDINGS: The heart size and mediastinal contours are within normal limits. Both lungs are clear. Chronic elevation of left hemidiaphragm. The visualized skeletal structures are unremarkable. IMPRESSION: No active disease. Electronically Signed   By: Ted Mcalpine M.D.   On: 09/30/2022 19:15    Microbiology: Results for orders placed or performed during the hospital encounter of 12/17/19  Culture, blood (routine x 2)     Status: None   Collection Time: 12/17/19  7:48 PM   Specimen: BLOOD  Result Value Ref Range Status   Specimen Description BLOOD SITE NOT SPECIFIED  Final   Special Requests   Final    BOTTLES DRAWN AEROBIC AND ANAEROBIC Blood Culture results may not be optimal due to an inadequate volume of blood received in culture bottles   Culture   Final    NO GROWTH 5 DAYS Performed at [facility], [address] [place], [place] [address]    Report Status 12/22/2019 FINAL  Final  Urine culture     Status: Abnormal   Collection Time: 12/17/19  7:48 PM   Specimen: Urine, Random  Result Value Ref Range Status   Specimen Description URINE, RANDOM  Final  Special Requests NONE  Final   Culture (A)  Final    <10,000 COLONIES/mL INSIGNIFICANT GROWTH Performed at [facility], [address], [place], [place] [address]    Report Status 12/18/2019 FINAL  Final  Culture, blood (routine x 2)     Status: None   Collection Time: 12/17/19  8:21 PM   Specimen: BLOOD  Result Value Ref Range Status   Specimen Description BLOOD SITE NOT SPECIFIED  Final   Special Requests   Final    BOTTLES DRAWN AEROBIC AND ANAEROBIC Blood Culture results may not be optimal due to an inadequate volume of blood received in culture bottles   Culture   Final    NO GROWTH 5 DAYS Performed at [facility], [address], [place], [place] [address]    Report Status 12/22/2019 FINAL  Final   Respiratory Panel by RT PCR (Flu A&B, Covid) - Nasopharyngeal Swab     Status: Abnormal   Collection Time: 12/17/19  8:21 PM   Specimen: Nasopharyngeal Swab  Result Value Ref Range Status   SARS Coronavirus 2 by RT PCR POSITIVE (A) NEGATIVE Final    Comment: RESULT CALLED TO, READ BACK BY AND VERIFIED WITH: M SCRUGGS RN 12/17/19 AT 2240 SK (NOTE) SARS-CoV-2 target nucleic acids are DETECTED.  SARS-CoV-2 RNA is generally detectable in upper respiratory specimens  during the acute phase of infection. Positive results are indicative of the presence of the identified virus, but do not rule out bacterial infection or co-infection with other pathogens not detected by the test. Clinical correlation with patient history and other diagnostic information is necessary to determine patient infection status. The expected result is Negative.  Fact Sheet for Patients:  https://www.moore.com/  Fact Sheet for Healthcare Providers: https://www.young.biz/  This test is not yet approved or cleared by the [place] FDA and  has been authorized for detection and/or diagnosis of SARS-CoV-2 by FDA under an Emergency Use Authorization (EUA).  This EUA will remain in effect (meaning this test can be [place] ed) for the duration of  the COVID-19 declaration under Section 564(b)(1) of the Act, 21 U.S.C. section 360bbb-3(b)(1), unless the authorization is terminated or revoked sooner.      Influenza A by PCR NEGATIVE NEGATIVE Final   Influenza B by PCR NEGATIVE NEGATIVE Final    Comment: (NOTE) The Xpert Xpress SARS-CoV-2/FLU/RSV assay is intended as an aid in  the diagnosis of influenza from Nasopharyngeal swab specimens and  should not be used as a sole basis for treatment. Nasal washings and  aspirates are unacceptable for Xpert Xpress SARS-CoV-2/FLU/RSV  testing.  Fact Sheet for Patients: https://www.moore.com/  Fact Sheet for Healthcare  Providers: https://www.young.biz/  This test is not yet approved or cleared by the [place] FDA and  has been authorized for detection and/or diagnosis of SARS-CoV-2 by  FDA under an Emergency Use Authorization (EUA). This EUA will remain  in effect (meaning this test can be used) for the duration of the  Covid-19 declaration under Section 564(b)(1) of the Act, 21  U.S.C. section 360bbb-3(b)(1), unless the authorization is  terminated or revoked. Performed at [facility], [address] [place], [place] [address]     Labs: CBC: Recent Labs  Lab 09/30/22 1826 09/30/22 1833 10/01/22 0105  WBC 6.8  --  5.8  HGB 13.5 11.9* 10.6*  HCT 43.0 35.0* 33.2*  MCV 99.5  --  94.1  PLT 130*  --  159   Basic  Metabolic Panel: Recent Labs  Lab 09/30/22 1826 09/30/22 1833 10/01/22 0105 10/01/22 1307  NA 148* 148* 145  --   K 2.8* 2.8* 2.9* 3.9  CL 113*  --  111  --   CO2 25  --  26  --   GLUCOSE 79  --  113*  --   BUN 22  --  20  --   CREATININE 0.99  --  0.81  --   CALCIUM 9.2  --  9.0  --   PHOS  --   --  2.5  --    Liver Function Tests: Recent Labs  Lab 09/30/22 1826  AST 41  ALT 54*  ALKPHOS 107  BILITOT 0.7  PROT 6.6  ALBUMIN 2.3*   CBG: Recent Labs  Lab 09/30/22 2209 10/01/22 0255 10/01/22 0642 10/01/22 0745 10/01/22 0954  GLUCAP 94 129* 118* 113* 99    Discharge time spent: approximately 35 minutes spent on discharge counseling, evaluation of patient on day of discharge, and coordination of discharge planning with nursing, social work, pharmacy and case management  Signed: Alberteen Sam, MD [facility] 10/01/2022

## 2022-10-01 NOTE — TOC Transition Note (Signed)
Transition of Care [facility]) - CM/SW Discharge Note   Patient Details  Name: Ernest Turner MRN: [id number] Date of Birth: 17-Aug-1940  [facility] CM/SW Contact:  Deatra [place], [place] Phone Number: 10/01/2022, 2:15 PM   Clinical Narrative: Pt for dc back to [facility]. Spoke to med tech [employer] [phone] at [facility] who confirmed pt able to return. DC summary faxed to facility [phone] at [facility] request. Pt's dtr [name] aware of dc and reports agreeable. RN provided with number for report and PTAR arranged for transport. SW signing off at dc.   Dellie Burns, MSW, LCSW [phone] (coverage)        Final next level of care: Assisted Living Barriers to Discharge: No Barriers Identified   Patient Goals and CMS Choice      Discharge Placement                  Patient to be transferred to facility by: PT[facility] Name of family member notified: Wendy/dtr Patient and family notified of of transfer: 10/01/22  Discharge Plan and Services Additional resources added to the After Visit Summary for                                       Social Determinants of Health (SDOH) Interventions SDOH Screenings   Tobacco Use: Medium Risk (01/06/2020)     Readmission Risk Interventions     No data to display

## 2022-10-01 NOTE — Plan of Care (Signed)

## 2022-10-01 NOTE — Care Management CC44 (Signed)
Condition Code 44 Documentation Completed  Patient Details  Name: Ernest Turner MRN: [id number] Date of Birth: [birth year]   Condition Code 44 given:  Yes Patient signature on Condition Code 44 notice:  Yes Documentation of 2 MD's agreement:  Yes Code 44 added to claim:  Yes    Isaias Cowman, RN 10/01/2022, 2:38 PM

## 2022-10-01 NOTE — Progress Notes (Signed)
Initial Nutrition Assessment  DOCUMENTATION CODES:   Not applicable  INTERVENTION:   -MVI with minerals daily -Magic cup TID with meals, each supplement provides 290 kcal and 9 grams of protein  -Ensure Enlive po BID, each supplement provides 350 kcal and 20 grams of protein.  -Feeding assistance with meals    NUTRITION DIAGNOSIS:   Inadequate oral intake related to lethargy/confusion, poor appetite as evidenced by meal completion < 25%.  GOAL:   Patient will meet greater than or equal to 90% of their needs  MONITOR:   PO intake, Supplement acceptance  REASON FOR ASSESSMENT:   Consult Assessment of nutrition requirement/status  ASSESSMENT:   Pt with medical history significant of dementia with a baseline status of not being verbal.  History is obtained from secondary sources.  For the last 5 days, patient has been noted to have very poor intake, progressive somnolence.  Pt admitted with hypernatremia.    Pt unavailable at time of visit. Attempted to speak with pt via call to hospital room phone, however, unable to reach. RD unable to obtain further nutrition-related history or complete nutrition-focused physical exam at this time.    Per H&P, pt with no oral intake for several days PTA.   Pt currently on a regular diet. Meal completions 20%.   Reviewed wt hx; noted distant wt hx over the past several years, however, no recent wt history available to assess acute weight changes.   If further poor oral intake persists, would not recommend artifical means of nutrition/ hydration secondary to advanced age and dementia as this would not enhance pt's quality of life.   Medications reviewed and include thiamine and dextrose 5% with KCl 20 mEq/L infusion @ 100 ml/hr.   Lab Results  Component Value Date   HGBA1C 6.2 (H) 10/01/2022   PTA DM medications are none.   Labs reviewed: CBGS: 99-113 (inpatient orders for glycemic control are 0-6 units insulin aspart every 4  hours).    Diet Order:   Diet Order             Diet regular Fluid consistency: Thin  Diet effective now                   EDUCATION NEEDS:   Not appropriate for education at this time  Skin:  Skin Assessment: Reviewed RN Assessment  Last BM:  Unknown  Height:   Ht Readings from Last 1 Encounters:  05/08/20 5\' 9"  (1.753 m)    Weight:   Wt Readings from Last 1 Encounters:  10/01/22 62.6 kg    Ideal Body Weight:  72.7 kg  BMI:  Body mass index is 20.38 kg/m.  Estimated Nutritional Needs:   Kcal:  1650-1850  Protein:  80-95 grams  Fluid:  > 1.6 L    Levada Schilling, RD, LDN, CDCES Registered Dietitian II Certified Diabetes Care and Education Specialist Please refer to [facility] for RD and/or RD on-call/weekend/after hours pager

## 2022-10-01 NOTE — Plan of Care (Signed)

## 2022-10-01 NOTE — Care Management Obs Status (Signed)
MEDICARE OBSERVATION STATUS NOTIFICATION   Patient Details  Name: Ernest Turner MRN: [id number] Date of Birth: 05/21/1940   Medicare Observation Status Notification Given:  Yes    Isaias Cowman, RN 10/01/2022, 2:38 PM

## 2022-10-04 ENCOUNTER — Encounter (HOSPITAL_COMMUNITY): Payer: Self-pay

## 2022-10-04 ENCOUNTER — Other Ambulatory Visit: Payer: Self-pay

## 2022-10-04 ENCOUNTER — Emergency Department (HOSPITAL_COMMUNITY): Payer: Medicare Other

## 2022-10-04 ENCOUNTER — Emergency Department (HOSPITAL_COMMUNITY)
Admission: EM | Admit: 2022-10-04 | Discharge: 2022-10-05 | Disposition: A | Discharge status: Home / Self Care | Payer: Medicare Other | Attending: Emergency Medicine | Admitting: Emergency Medicine

## 2022-10-04 DIAGNOSIS — R4182 Altered mental status, unspecified: Secondary | ICD-10-CM | POA: Insufficient documentation

## 2022-10-04 DIAGNOSIS — Z7189 Other specified counseling: Secondary | ICD-10-CM

## 2022-10-04 DIAGNOSIS — Z515 Encounter for palliative care: Secondary | ICD-10-CM | POA: Diagnosis not present

## 2022-10-04 DIAGNOSIS — G309 Alzheimer's disease, unspecified: Secondary | ICD-10-CM | POA: Diagnosis not present

## 2022-10-04 DIAGNOSIS — F0390 Unspecified dementia without behavioral disturbance: Secondary | ICD-10-CM | POA: Insufficient documentation

## 2022-10-04 DIAGNOSIS — R627 Adult failure to thrive: Secondary | ICD-10-CM | POA: Diagnosis not present

## 2022-10-04 DIAGNOSIS — R638 Other symptoms and signs concerning food and fluid intake: Secondary | ICD-10-CM

## 2022-10-04 DIAGNOSIS — Z1152 Encounter for screening for COVID-19: Secondary | ICD-10-CM | POA: Insufficient documentation

## 2022-10-04 LAB — URINALYSIS, W/ REFLEX TO CULTURE (INFECTION SUSPECTED)
Bacteria, UA: NONE SEEN
Bilirubin Urine: NEGATIVE
Glucose, UA: NEGATIVE mg/dL
Hgb urine dipstick: NEGATIVE
Ketones, ur: NEGATIVE mg/dL
Leukocytes,Ua: NEGATIVE
Nitrite: NEGATIVE
Protein, ur: NEGATIVE mg/dL
Specific Gravity, Urine: 1.018 (ref 1.005–1.030)
pH: 5 (ref 5.0–8.0)

## 2022-10-04 LAB — COMPREHENSIVE METABOLIC PANEL
ALT: 32 U/L (ref 0–44)
AST: 29 U/L (ref 15–41)
Albumin: 2.3 g/dL — ABNORMAL LOW (ref 3.5–5.0)
Alkaline Phosphatase: 104 U/L (ref 38–126)
Anion gap: 8 (ref 5–15)
BUN: 11 mg/dL (ref 8–23)
CO2: 23 mmol/L (ref 22–32)
Calcium: 8.2 mg/dL — ABNORMAL LOW (ref 8.9–10.3)
Chloride: 109 mmol/L (ref 98–111)
Creatinine, Ser: 0.83 mg/dL (ref 0.61–1.24)
GFR, Estimated: >60 mL/min (ref >60)
Glucose, Bld: 87 mg/dL (ref 70–99)
Potassium: 3.1 mmol/L — ABNORMAL LOW (ref 3.5–5.1)
Sodium: 140 mmol/L (ref 135–145)
Total Bilirubin: 0.7 mg/dL (ref 0.3–1.2)
Total Protein: 6 g/dL — ABNORMAL LOW (ref 6.5–8.1)

## 2022-10-04 LAB — CBC WITH DIFFERENTIAL/PLATELET
Abs Immature Granulocytes: 0.04 10*3/uL (ref 0.00–0.07)
Basophils Absolute: 0 10*3/uL (ref 0.0–0.1)
Basophils Relative: 0 %
Eosinophils Absolute: 0 10*3/uL (ref 0.0–0.5)
Eosinophils Relative: 1 %
HCT: 35.8 % — ABNORMAL LOW (ref 39.0–52.0)
Hemoglobin: 11.7 g/dL — ABNORMAL LOW (ref 13.0–17.0)
Immature Granulocytes: 1 %
Lymphocytes Relative: 10 %
Lymphs Abs: 0.7 10*3/uL (ref 0.7–4.0)
MCH: 30.9 pg (ref 26.0–34.0)
MCHC: 32.7 g/dL (ref 30.0–36.0)
MCV: 94.5 fL (ref 80.0–100.0)
Monocytes Absolute: 0.4 10*3/uL (ref 0.1–1.0)
Monocytes Relative: 6 %
Neutro Abs: 5.5 10*3/uL (ref 1.7–7.7)
Neutrophils Relative %: 82 %
Platelets: 172 10*3/uL (ref 150–400)
RBC: 3.79 MIL/uL — ABNORMAL LOW (ref 4.22–5.81)
RDW: 14.7 % (ref 11.5–15.5)
WBC: 6.6 10*3/uL (ref 4.0–10.5)
nRBC: 0 % (ref 0.0–0.2)

## 2022-10-04 LAB — SARS CORONAVIRUS 2 BY RT PCR: SARS Coronavirus 2 by RT PCR: NEGATIVE

## 2022-10-04 LAB — MAGNESIUM: Magnesium: 2.1 mg/dL (ref 1.7–2.4)

## 2022-10-04 LAB — CBG MONITORING, ED: Glucose-Capillary: 89 mg/dL (ref 70–99)

## 2022-10-04 MED ORDER — LACTATED RINGERS IV BOLUS
1000.0000 mL | Freq: Once | INTRAVENOUS | Status: AC
  Administered 2022-10-04: 1000 mL via INTRAVENOUS

## 2022-10-04 NOTE — ED Provider Notes (Signed)
[name] EMERGENCY DEPARTMENT AT [facility] Provider Note   CSN: [id number] Arrival date & time: 10/04/22  1353     History  Chief Complaint  Patient presents with   Altered Mental Status    Ernest Turner is a 82 y.o. male.  HPI    82 year old male comes in with chief complaint of altered mental status. Patient comes from nursing home where he has been there for 3 days.  I did call nursing home, but did not receive significant meaningful history besides patient appears altered.    Level 5 caveat for advanced dementia.  Patient unable to provide any meaningful history.  It appears that patient was discharged from hospital on 7-13, which was only 3 or 4 days ago.  He had come in with encephalopathy at that time.  Patient had hyponatremia, hypokalemia at that time.  Home Medications Prior to Admission medications   Medication Sig Start Date End Date Taking? Authorizing Provider  haloperidol (HALDOL) 1 MG tablet Take 1 mg by mouth 2 (two) times daily as needed for agitation.   Yes [provider]  loratadine (CLARITIN) 10 MG tablet Take 10 mg by mouth daily.   Yes [provider]  Neomycin-Bacitracin-Polymyxin (TRIPLE ANTIBIOTIC) [phone] OINT Apply 1 application topically 2 (two) times daily as needed (minor skin tears or abrasians).   Yes [provider]  Nutritional Supplements (NUTRITIONAL SUPPLEMENT PO) Take 118 mLs by mouth 4 (four) times daily.   Yes [provider]  senna (SENOKOT) 8.6 MG TABS tablet Take 1 tablet by mouth daily.   Yes [provider]  sertraline (ZOLOFT) 25 MG tablet Take 25 mg by mouth at bedtime.   Yes [provider]  tamsulosin (FLOMAX) 0.4 MG CAPS capsule Take 1 capsule (0.4 mg total) by mouth daily after breakfast. Take tamsulosin only if having difficulty voiding. Patient taking differently: Take 0.4 mg by mouth at bedtime. 10/16/14  Yes Jethro Bolus, MD  vitamin B-12  1000 MCG tablet Take 1 tablet (1,000 mcg total) by mouth daily. 10/24/19  Yes Danford, Earl Lites, MD      Allergies    Nutritional supplements, Androgel [testosterone], and Viagra [sildenafil citrate]    Review of Systems   Review of Systems  Physical Exam Updated Vital Signs BP (!) 146/87   Pulse 64   Temp (!) 96.8 F (36 C) (Axillary)   Resp 17   Wt 62.6 kg   SpO2 96%   BMI 20.38 kg/m  Physical Exam Vitals and nursing note reviewed.  Constitutional:      Appearance: He is well-developed.     Comments: Somnolent, easily arousable but not able to provide any meaningful history  HENT:     Head: Atraumatic.  Cardiovascular:     Rate and Rhythm: Normal rate.  Pulmonary:     Effort: Pulmonary effort is normal.  Abdominal:     Tenderness: There is no abdominal tenderness.  Musculoskeletal:     Cervical back: Neck supple.  Skin:    General: Skin is warm.  Neurological:     Mental Status: He is disoriented.     ED Results / Procedures / Treatments   Labs (all labs ordered are listed, but only abnormal results are displayed) Labs Reviewed  COMPREHENSIVE METABOLIC PANEL - Abnormal; Notable for the following components:      Result Value   Potassium 3.1 (*)    Calcium 8.2 (*)    Total Protein 6.0 (*)  Albumin 2.3 (*)    All other components within normal limits  CBC WITH DIFFERENTIAL/PLATELET - Abnormal; Notable for the following components:   RBC 3.79 (*)    Hemoglobin 11.7 (*)    HCT 35.8 (*)    All other components within normal limits  URINALYSIS, W/ REFLEX TO CULTURE (INFECTION SUSPECTED) - Abnormal; Notable for the following components:   Color, Urine AMBER (*)    All other components within normal limits  SARS CORONAVIRUS 2 BY RT PCR  MAGNESIUM  CBG MONITORING, ED    EKG EKG Interpretation Date/Time:  Tuesday October 04 2022 17:39:01 EDT Ventricular Rate:  75 PR Interval:  118 QRS Duration:  93 QT Interval:  363 QTC Calculation: 406 R  Axis:   70  Text Interpretation: Sinus rhythm Ventricular premature complex Aberrant complex Borderline short PR interval Low voltage, extremity and precordial leads Borderline repolarization abnormality Confirmed by Alvester Chou [phone]) on 10/04/2022 5:42:47 PM  Radiology No results found.  Procedures Procedures    Medications Ordered in ED Medications  lactated ringers bolus 1,000 mL (0 mLs Intravenous Stopped 10/04/22 1916)    ED Course/ Medical Decision Making/ A&P                             Medical Decision Making Amount and/or Complexity of Data Reviewed Labs: ordered. Radiology: ordered.   82 year old male comes in with chief complaint of acute altered mental status change.  Patient was just discharged from the hospital 3 days ago when he was admitted for metabolic encephalopathy and electrolyte derangement.  Patient was not eating well at that time.  History provided by nursing home.  I also reviewed patient's records to get further insight on patient's overall wellbeing.  Patient not providing meaningful history.  He does not appear to be in any respiratory distress.  He has soft abdomen.  Patient however does appear to be somnolent.  He is not following all commands.  He is arousable.  Differential diagnosis for this patient includes ICH / Stroke, acute coronary syndrome, Infection - UTI/Pneumonia/soft tissue infection leading to encephalopathy, encephalopathy due to electrolyte abnormality or drug interactions or toxins or metabolic conditions like adrenal insufficiency, hyperglycemia, paraneoplastic process  Initially it appears that patient likely has encephalopathy due to metabolic issues or infectious process -as he did last time, particularly because he is not eating or drinking well.  We have ordered basic labs.  Patient's care has been taken on by incoming team.  Disposition per lab findings.  He might need TOC consultation if nursing home not comfortable taking  him back. Final Clinical Impression(s) / ED Diagnoses Final diagnoses:  Adult failure to thrive  Dementia, unspecified dementia severity, unspecified dementia type, unspecified whether behavioral, psychotic, or mood disturbance or anxiety [facility])    Rx / DC Orders ED Discharge Orders          Ordered    Diet regular        10/04/22 2046              Derwood Kaplan, MD 10/11/22 1511

## 2022-10-04 NOTE — ED Triage Notes (Signed)
Pt coming from [facility] dementia care center. Pt sent out on the 13th for AMS, not eating/drinking, or physically active. Pt normally A&O 0x, but moves around and talks. Pt continues to decline since last being seen, continues to not eat/drink, or be as active.

## 2022-10-04 NOTE — Care Management (Signed)
ED RN

## 2022-10-04 NOTE — ED Provider Notes (Signed)
82 yo male presenting from [facility] with concern for AMS Discharged 3 days ago for metabolic encephalopathy Reportedly no eating/drinking or as physically active as normal  Pending labs, imaging  Physical Exam  BP 112/72 (BP Location: Left Arm)   Pulse 66   Temp 98 F (36.7 C)   Resp 18   Wt 62.6 kg   SpO2 96%   BMI 20.38 kg/m   Physical Exam  Procedures  Procedures  ED Course / MDM    Medical Decision Making Amount and/or Complexity of Data Reviewed Labs: ordered. Radiology: ordered.   Patient's workup is largely unremarkable, mild hypokalemia.  I had a long discussion with the patient's daughter regarding as well as of care.  I strongly suspect that these intermittent issues with his refusal to eat or drink and his refusal to walk or participate in typical activities are likely related to his progressing degenerative neurocognitive disease, has Alzheimer's dementia.  It is difficult to predict the course of this but if he is truly refusing to drink water then he would likely rapidly decline and die.  His daughter who is his power of attorney is present and reports that they would be prepared for hospice care if this is the case.  I attempted to speak to case management overnight, and they attempted to reach out to his memory care facility were unsuccessful doing so.  Ultimately we have opted to keep the patient observation overnight in the emergency department to have palliative care and case management evaluate him tomorrow for potential hospice placement.      Terald Sleeper, MD 10/04/22 2303

## 2022-10-04 NOTE — Social Work (Addendum)
Per provider patient will stay over night as MD would like the patient to be evaluated  by Palliative care/ Hospice At this time this CSW did attempt to call patients daughter N/A. Consult was put in. MD feels at this time patient is not safe to DC due to not eating or drinking for days at a time. TOC will continue to follow patient to see next steps. Patient is currently residing at [facility].

## 2022-10-05 DIAGNOSIS — R627 Adult failure to thrive: Secondary | ICD-10-CM

## 2022-10-05 DIAGNOSIS — Z7189 Other specified counseling: Secondary | ICD-10-CM

## 2022-10-05 DIAGNOSIS — Z515 Encounter for palliative care: Secondary | ICD-10-CM

## 2022-10-05 DIAGNOSIS — F02C0 Dementia in other diseases classified elsewhere, severe, without behavioral disturbance, psychotic disturbance, mood disturbance, and anxiety: Secondary | ICD-10-CM

## 2022-10-05 DIAGNOSIS — G309 Alzheimer's disease, unspecified: Secondary | ICD-10-CM

## 2022-10-05 DIAGNOSIS — R638 Other symptoms and signs concerning food and fluid intake: Secondary | ICD-10-CM

## 2022-10-05 NOTE — ED Provider Notes (Signed)
Patient seen by palliative care and hospice will be as arranged as an outpatient   Lorre Nick, MD 10/05/22 1111

## 2022-10-05 NOTE — ED Notes (Signed)
Pt. Repositioned

## 2022-10-05 NOTE — ED Notes (Signed)
Adjusted patient's pulse oximeter to the ear due to sleeping position

## 2022-10-05 NOTE — ED Notes (Signed)
[facility] called for patient to be transported

## 2022-10-05 NOTE — TOC CM/SW Note (Signed)
Transition of Care [facility]) - Emergency Department Mini Assessment   Patient Details  Name: Ernest Turner MRN: [id number] Date of Birth: 1940/07/08  Transition of Care [facility]) CM/SW Contact:    Armanda Heritage, RN Phone Number: 10/05/2022, 11:30 AM   Clinical Narrative: CM met with patient's daughter at bedside who shared recent hospitalization and return  to facility and shared that patient was not feeling well and therefore returned to the ED.  Per daughter she is familiar with his medical diagnoses and is interested in a comfort focused approached to his care.  This CM discussed hospice vs palliative care services and daughter is interested in hospice services at [facility] house.  Reports patient has been at facility for 2 years and this is his familiar setting and if possible she would like for him to return there.  Daughter presented with choice and selects [facility] to provide hospice services.  Referral made to hospital liaison for [facility] and [facility] contacted and made aware patient will discharge from ED with hospice services.  Patient to transport via EMS.  Care team has been updated with this information.  No further TOC needs identified at this time.    ED Mini Assessment: What brought you to the Emergency Department? : not feeling well at ALF  Barriers to Discharge: ED No Barriers     Means of departure: Ambulance  Interventions which prevented an admission or readmission: Hospice    Patient Contact and Communications Key Contact 1: Ernest Turner   Spoke with: Ernest Turner Date: 10/05/22,   Contact time: 1129   Call outcome: will return to ALF with hospice support  Patient states their goals for this hospitalization and ongoing recovery are:: to return to ALF CMS Medicare.gov Compare Post Acute Care list provided to:: Patient Represenative (must comment) Choice offered to / list presented to : Adult Children  Admission diagnosis:  ftt Patient  Active Problem List   Diagnosis Date Noted   Palliative care encounter 10/05/2022   Adult failure to thrive 10/05/2022   Counseling and coordination of care 10/05/2022   Goals of care, counseling/discussion 10/05/2022   Inadequate oral intake 10/05/2022   Hypokalemia 10/01/2022   Respiratory alkalosis 09/30/2022   Hypernatremia 09/30/2022   Dehydration 09/30/2022   COVID-19 virus infection 12/17/2019   Acute metabolic encephalopathy 10/21/2019   Parapneumonic effusion 10/21/2019   Pneumonia of left lower lobe due to infectious organism 10/21/2019   Lactic acidosis 10/21/2019   Bacterial conjunctivitis of both eyes 10/21/2019   Essential hypertension 10/21/2019   Dementia (HCC) 08/12/2015   Nausea & vomiting 04/04/2012   BPH (benign prostatic hyperplasia)    Hypogonadism male    Allergy    Arthritis    Depression    GERD (gastroesophageal reflux disease)    Hypercholesterolemia    Heart murmur    Ulcer    ED (erectile dysfunction)    Prostate cancer (HCC) 12/26/2011   PCP:  Pcp, No Pharmacy:  No Pharmacies Listed

## 2022-10-05 NOTE — ED Notes (Signed)
Pt. Ate 4 small bites of french toast.

## 2022-10-05 NOTE — ED Notes (Signed)
RN called [facility] and gave report to nurse Rodney Booze

## 2022-10-05 NOTE — ED Notes (Signed)
Pt. Ate 1/2 of chicken noodle soup and one bite of broccoli

## 2022-10-05 NOTE — Progress Notes (Signed)
[employer], [facility]) Hospital Liaison Note  Referral received for patient/family interest in hospice at L[facility]. Interest confirmed with daughter.   Plan is to discharge back to memory care today.   No DME needed.   Please send comfort medications/prescriptions with patient at discharge.   Please call with any questions or concerns. Thank you  Dionicio Stall, [facility] Liaison [phone]

## 2022-10-05 NOTE — Consult Note (Addendum)
Consultation Note Date: 10/05/2022   Patient Name: Ernest Turner  DOB: Dec 31, 1940  MRN: [id number]  Age / Sex: 82 y.o., male   PCP: Pcp, No Referring Physician: Default, Provider, MD  Reason for Consultation: Hospice Evaluation     Chief Complaint/History of Present Illness:   Patient is an 82 year old with advanced Alzheimer's dementia with recent hospitalization (discharged 7/13) who was brought to the ER on 716 for management of altered mental status.  Patient's recent hospitalization had been for same admission of metabolic encephalopathy.  Reportedly patient is not eating or drinking and memory care facility.  Palliative medicine team consulted to assist with complex medical decision making.  Reviewed EMR prior to presenting to bedside.  Noted that patient has documents in ACP requesting DNR CODE STATUS.  Has also been DNR CODE STATUS during prior hospitalization since 2021. Also discussed care extensively with [facility], ED attending, and RN to coordinate patient's care since request has been made for hospice evaluation for patient.  Presented to bedside to see patient.  Patient sleeping comfortably in the bed.  No family present at bedside.  Patient easily awakened though did not speak once awakened.  No nonverbal signs of pain present on examination. Only mumbled incoherently shortly.  Of note patient had DNR form signed at bedside. Since patient not able to engage in complex medical decision making, called patient's daughter.  No answer when attempting to reach daughter via phone.  Informed later in morning patient's daughter had presented to bedside and TOC was going to discuss hospice referral with her.  Primary Diagnoses  Present on Admission: **None**   Palliative Review of Systems: Unable to obtain secondary to medical status  Past Medical History:  Diagnosis Date   Arthritis    BPH (benign prostatic hyperplasia)    Depression    ED (erectile dysfunction)    Full  dentures    Heart murmur    Hemorrhoids    History of peptic ulcer    History of radiation therapy to prostate 7800 cGy 40 sessions 04-11-2012 to 06-11-2012   EXTERNAL BEAM PELVIC AREA FOR PROSTATE CANCER   History of urinary retention    HOH (hard of hearing)    Hyperlipidemia    Hypertension    Hypogonadism male    Lower urinary tract symptoms (LUTS)    Memory difficulty    Memory loss    Poor historian    Prostate cancer [facility]) dx 12/26/11  urologist-  dr Patsi Sears  oncologist-  dr Dayton Scrape   GOLD SEED IMPLANT  03-07-2012--- Adenocarcinoma,gleason=3+3=6.,& 3+4=7,PSA=15.84--  s/p  external beam radiation 04-11-2012 to 06-11-2012   Wears glasses    Social History   Socioeconomic History   Marital status: Widowed    Spouse name: Not on file   Number of children: 3   Years of education: 2 yrs clg   Highest education level: Not on file  Occupational History   Occupation: Retired  Tobacco Use   Smoking status: Former    Current packs/day: 0.00    Average packs/day: 2.0 packs/day for 20.0 years (40.0 ttl pk-yrs)    Types: Cigarettes    Start date: 01/21/1964    Quit date: 01/21/1984    Years since quitting: 38.7   Smokeless tobacco: Never  Substance and Sexual Activity   Alcohol use: No   Drug use: No   Sexual activity: Not on file  Other Topics Concern   Not on file  Social History Narrative   Lives at  home alone.   Left-handed.   Rarely drinks caffeine.   Social Determinants of Health   Financial Resource Strain: Not on file  Food Insecurity: Not on file  Transportation Needs: Not on file  Physical Activity: Not on file  Stress: Not on file  Social Connections: Not on file   Family History  Problem Relation Age of Onset   Cancer Sister        liver to brain mets died late 40's   Cancer Daughter        breast cancer died age 75   Diabetes Mother    Scheduled Meds: Continuous Infusions: PRN Meds:. Allergies  Allergen Reactions   Nutritional Supplements  Other (See Comments)    Prostate supplement - unknown reaction   Androgel [Testosterone] Other (See Comments)    Unknown reaction   Viagra [Sildenafil Citrate] Other (See Comments)    Unknown reaction   CBC:    Component Value Date/Time   WBC 6.6 10/04/2022 1742   HGB 11.7 (L) 10/04/2022 1742   HCT 35.8 (L) 10/04/2022 1742   PLT 172 10/04/2022 1742   MCV 94.5 10/04/2022 1742   NEUTROABS 5.5 10/04/2022 1742   LYMPHSABS 0.7 10/04/2022 1742   MONOABS 0.4 10/04/2022 1742   EOSABS 0.0 10/04/2022 1742   BASOSABS 0.0 10/04/2022 1742   Comprehensive Metabolic Panel:    Component Value Date/Time   NA 140 10/04/2022 1742   K 3.1 (L) 10/04/2022 1742   CL 109 10/04/2022 1742   CO2 23 10/04/2022 1742   BUN 11 10/04/2022 1742   CREATININE 0.83 10/04/2022 1742   GLUCOSE 87 10/04/2022 1742   CALCIUM 8.2 (L) 10/04/2022 1742   AST 29 10/04/2022 1742   ALT 32 10/04/2022 1742   ALKPHOS 104 10/04/2022 1742   BILITOT 0.7 10/04/2022 1742   PROT 6.0 (L) 10/04/2022 1742   ALBUMIN 2.3 (L) 10/04/2022 1742    Physical Exam: Vital Signs: BP (!) 122/90   Pulse 74   Temp (!) 96.8 F (36 C) (Axillary)   Resp 13   Wt 62.6 kg   SpO2 98%   BMI 20.38 kg/m  SpO2: SpO2: 98 % O2 Device: O2 Device: Room Air O2 Flow Rate:   Intake/output summary: No intake or output data in the 24 hours ending 10/05/22 0843 LBM:   Baseline Weight: Weight: 62.6 kg Most recent weight: Weight: 62.6 kg  General: NAD, awake, cachectic, frail, unable to engage in conversation Eyes: No drainage noted HENT: Dry mucous membranes Cardiovascular: RRR Respiratory: no increased work of breathing noted, not in respiratory distress Abdomen: not distended Extremities: Muscle wasting present in all extremities Skin: no rashes or lesions on visible skin Neuro: Unable to participate in conversation, history of dementia noted          Palliative Performance Scale: 20%              Additional Data Reviewed: Recent Labs     10/04/22 1742  WBC 6.6  HGB 11.7*  PLT 172  NA 140  BUN 11  CREATININE 0.83    Imaging: DG Chest Port 1 View CLINICAL DATA:  Altered mental status  EXAM: PORTABLE CHEST 1 VIEW  COMPARISON:  09/30/2022, 12/17/2019, CT 05/28/2018  FINDINGS: Limited by habitus and positioning, the patient's chin obscures the right apex. Chronic elevation left diaphragm with large hernia, better seen on prior CT. Difficult to exclude airspace disease at the right base  IMPRESSION: Limited by habitus and positioning. Chronic elevation left diaphragm with  large hernia. Difficult to exclude airspace disease at the right base.  Electronically Signed   By: Jasmine Pang M.D.   On: 10/04/2022 17:56 CT HEAD WO CONTRAST CLINICAL DATA:  Altered mental status failure to thrive  EXAM: CT HEAD WITHOUT CONTRAST  TECHNIQUE: Contiguous axial images were obtained from the base of the skull through the vertex without intravenous contrast.  RADIATION DOSE REDUCTION: This exam was performed according to the departmental dose-optimization program which includes automated exposure control, adjustment of the mA and/or kV according to patient size and/or use of iterative reconstruction technique.  COMPARISON:  CT brain 09/30/2022, 05/08/2020  FINDINGS: Brain: No acute territorial infarction, hemorrhage or intracranial mass. Moderate atrophy. Similar ventricular enlargement likely due to atrophy.  Vascular: No hyperdense vessels.  Carotid vascular calcification  Skull: No fracture  Sinuses/Orbits: Old fracture deformity medial wall right orbit. Old appearing nasal bone fracture. Small fluid in the left mastoid air cells  Other: None  IMPRESSION: 1. No CT evidence for acute intracranial abnormality. Atrophy. 2. Small fluid in the left mastoid air cells.  Electronically Signed   By: Jasmine Pang M.D.   On: 10/04/2022 17:25    I personally reviewed recent imaging.   Palliative Care  Assessment and Plan Summary of Established Goals of Care and Medical Treatment Preferences   Patient is an 82 year old with advanced Alzheimer's dementia with recent hospitalization (discharged 7/13) who was brought to the ER on 716 for management of altered mental status.  Patient's recent hospitalization had been for same admission of metabolic encephalopathy.  Reportedly patient is not eating or drinking and memory care facility.  Palliative medicine team consulted to assist with complex medical decision making.  # Complex medical decision making/goals of care  -Patient unable to participate in complex medical decision-making due to medical status.  Patient appears to have advanced Alzheimer's dementia FAST stage at least 7c based on this assessment. Due to being FAST stage being at least 7 and reportedly patient being unable to maintain sufficient fluid and calorie intake with serum albumin < 2.5 (currently 2.3), patient would be appropriate for hospice evaluation with expected prognosis os 6 months or less accordingly to medicare dementia admission guidelines.   -Attempted to reach patient's daughter via phone though unable to do so. RN updated later in day she presented to bedside and TOC was going to assist with coordination of hospice referral.   -  Code Status: Prior- Patient has documentation on file noting DNR code status. Gold form also present at bedside of patient.  Prognosis: < 6 months  # Symptom management  -No symptoms of concern noted (such as nonverbal pain) on evaluation when seen today in AM  # Psycho-social/Spiritual Support:  - Support System: daughter  # Discharge Planning:  To Be Determined- TOC following up with daughter about hospice evaluation   Thank you for allowing the palliative care team to participate in the care Antionette Poles.  Alvester Morin, DO Palliative Care Provider PMT # [phone]  If patient remains symptomatic despite maximum doses, please  call PMT at [phone] between 0700 and 1900. Outside of these hours, please call attending, as PMT does not have night coverage.  This provider spent a total of 80 minutes providing patient's care.  Includes review of EMR, discussing care with other staff members involved in patient's medical care, obtaining relevant history and information from patient and/or patient's family, and personal review of imaging and lab work. Greater than 50% of the time  was spent counseling and coordinating care related to the above assessment and plan.    UPDATE: Informed by TOC that daughter has opted for patient to return to facility with hospice support. TOC to assist with coordination. Daughter did not have any concerns regarding GOC or symptom management for this provider. Noted PMT would be available if needed. Patient to return to facility today with hospice.  *Please note that this is a verbal dictation therefore any spelling or grammatical errors are due to the "[employer] One" system interpretation.

## 2022-12-20 DEATH — deceased
